# Patient Record
Sex: Female | Born: 1960 | Race: White | Hispanic: No | Smoking: Never smoker
Health system: Southern US, Community
[De-identification: ages and names within clinical notes are randomized; demographics above are authoritative.]

## PROBLEM LIST (undated history)

## (undated) DIAGNOSIS — F419 Anxiety disorder, unspecified: Secondary | ICD-10-CM

## (undated) DIAGNOSIS — F329 Major depressive disorder, single episode, unspecified: Secondary | ICD-10-CM

## (undated) DIAGNOSIS — I8390 Asymptomatic varicose veins of unspecified lower extremity: Secondary | ICD-10-CM

## (undated) DIAGNOSIS — H3530 Unspecified macular degeneration: Secondary | ICD-10-CM

## (undated) DIAGNOSIS — M419 Scoliosis, unspecified: Secondary | ICD-10-CM

## (undated) DIAGNOSIS — Z9889 Other specified postprocedural states: Secondary | ICD-10-CM

## (undated) DIAGNOSIS — T7840XA Allergy, unspecified, initial encounter: Secondary | ICD-10-CM

## (undated) DIAGNOSIS — M1990 Unspecified osteoarthritis, unspecified site: Secondary | ICD-10-CM

## (undated) DIAGNOSIS — F32A Depression, unspecified: Secondary | ICD-10-CM

## (undated) DIAGNOSIS — N950 Postmenopausal bleeding: Secondary | ICD-10-CM

## (undated) DIAGNOSIS — N840 Polyp of corpus uteri: Secondary | ICD-10-CM

## (undated) DIAGNOSIS — C801 Malignant (primary) neoplasm, unspecified: Secondary | ICD-10-CM

## (undated) DIAGNOSIS — E039 Hypothyroidism, unspecified: Secondary | ICD-10-CM

## (undated) DIAGNOSIS — IMO0002 Reserved for concepts with insufficient information to code with codable children: Secondary | ICD-10-CM

## (undated) DIAGNOSIS — Z859 Personal history of malignant neoplasm, unspecified: Secondary | ICD-10-CM

## (undated) DIAGNOSIS — J329 Chronic sinusitis, unspecified: Secondary | ICD-10-CM

## (undated) DIAGNOSIS — I872 Venous insufficiency (chronic) (peripheral): Secondary | ICD-10-CM

## (undated) DIAGNOSIS — Z85828 Personal history of other malignant neoplasm of skin: Secondary | ICD-10-CM

## (undated) DIAGNOSIS — R112 Nausea with vomiting, unspecified: Secondary | ICD-10-CM

## (undated) DIAGNOSIS — F3181 Bipolar II disorder: Secondary | ICD-10-CM

## (undated) DIAGNOSIS — R87610 Atypical squamous cells of undetermined significance on cytologic smear of cervix (ASC-US): Secondary | ICD-10-CM

## (undated) HISTORY — PX: COLONOSCOPY: SHX174

## (undated) HISTORY — DX: Unspecified macular degeneration: H35.30

## (undated) HISTORY — DX: Reserved for concepts with insufficient information to code with codable children: IMO0002

## (undated) HISTORY — DX: Allergy, unspecified, initial encounter: T78.40XA

## (undated) HISTORY — DX: Hypothyroidism, unspecified: E03.9

## (undated) HISTORY — DX: Chronic sinusitis, unspecified: J32.9

## (undated) HISTORY — DX: Malignant (primary) neoplasm, unspecified: C80.1

## (undated) HISTORY — DX: Scoliosis, unspecified: M41.9

## (undated) HISTORY — DX: Atypical squamous cells of undetermined significance on cytologic smear of cervix (ASC-US): R87.610

---

## 1993-05-31 HISTORY — PX: INGUINAL HERNIA REPAIR: SUR1180

## 1999-06-01 HISTORY — PX: ACHILLES TENDON SURGERY: SHX542

## 2001-10-17 ENCOUNTER — Other Ambulatory Visit: Admission: RE | Admit: 2001-10-17 | Discharge: 2001-10-17 | Payer: Self-pay | Admitting: Gynecology

## 2002-12-05 ENCOUNTER — Other Ambulatory Visit: Admission: RE | Admit: 2002-12-05 | Discharge: 2002-12-05 | Payer: Self-pay | Admitting: Gynecology

## 2004-01-13 ENCOUNTER — Other Ambulatory Visit: Admission: RE | Admit: 2004-01-13 | Discharge: 2004-01-13 | Payer: Self-pay | Admitting: Gynecology

## 2005-01-20 ENCOUNTER — Other Ambulatory Visit: Admission: RE | Admit: 2005-01-20 | Discharge: 2005-01-20 | Payer: Self-pay | Admitting: Gynecology

## 2005-12-13 ENCOUNTER — Other Ambulatory Visit: Admission: RE | Admit: 2005-12-13 | Discharge: 2005-12-13 | Payer: Self-pay | Admitting: Gynecology

## 2006-04-28 ENCOUNTER — Ambulatory Visit: Payer: Self-pay | Admitting: Internal Medicine

## 2006-08-09 ENCOUNTER — Ambulatory Visit: Payer: Self-pay | Admitting: Internal Medicine

## 2006-12-29 ENCOUNTER — Other Ambulatory Visit: Admission: RE | Admit: 2006-12-29 | Discharge: 2006-12-29 | Payer: Self-pay | Admitting: Gynecology

## 2007-03-08 DIAGNOSIS — R519 Headache, unspecified: Secondary | ICD-10-CM | POA: Insufficient documentation

## 2007-03-08 DIAGNOSIS — R51 Headache: Secondary | ICD-10-CM | POA: Insufficient documentation

## 2007-03-31 ENCOUNTER — Ambulatory Visit: Payer: Self-pay | Admitting: Internal Medicine

## 2007-03-31 DIAGNOSIS — K006 Disturbances in tooth eruption: Secondary | ICD-10-CM | POA: Insufficient documentation

## 2007-06-27 ENCOUNTER — Ambulatory Visit: Payer: Self-pay | Admitting: Internal Medicine

## 2007-06-27 DIAGNOSIS — J069 Acute upper respiratory infection, unspecified: Secondary | ICD-10-CM | POA: Insufficient documentation

## 2008-01-02 ENCOUNTER — Other Ambulatory Visit: Admission: RE | Admit: 2008-01-02 | Discharge: 2008-01-02 | Payer: Self-pay | Admitting: Gynecology

## 2009-02-05 ENCOUNTER — Encounter: Payer: Self-pay | Admitting: Gynecology

## 2009-02-05 ENCOUNTER — Other Ambulatory Visit: Admission: RE | Admit: 2009-02-05 | Discharge: 2009-02-05 | Payer: Self-pay | Admitting: Gynecology

## 2009-02-05 ENCOUNTER — Ambulatory Visit: Payer: Self-pay | Admitting: Gynecology

## 2009-07-25 ENCOUNTER — Emergency Department (HOSPITAL_COMMUNITY): Admission: EM | Admit: 2009-07-25 | Discharge: 2009-07-25 | Payer: Self-pay | Admitting: Emergency Medicine

## 2009-07-29 HISTORY — PX: BREAST SURGERY: SHX581

## 2009-08-01 ENCOUNTER — Telehealth: Payer: Self-pay | Admitting: Internal Medicine

## 2009-09-09 ENCOUNTER — Telehealth: Payer: Self-pay | Admitting: Internal Medicine

## 2009-09-10 ENCOUNTER — Telehealth: Payer: Self-pay | Admitting: Internal Medicine

## 2009-10-10 ENCOUNTER — Telehealth: Payer: Self-pay | Admitting: Internal Medicine

## 2009-10-21 ENCOUNTER — Telehealth: Payer: Self-pay | Admitting: Internal Medicine

## 2009-12-25 ENCOUNTER — Telehealth: Payer: Self-pay | Admitting: Internal Medicine

## 2009-12-26 ENCOUNTER — Encounter (INDEPENDENT_AMBULATORY_CARE_PROVIDER_SITE_OTHER): Payer: Self-pay | Admitting: *Deleted

## 2010-02-04 ENCOUNTER — Encounter (INDEPENDENT_AMBULATORY_CARE_PROVIDER_SITE_OTHER): Payer: Self-pay | Admitting: *Deleted

## 2010-02-05 ENCOUNTER — Ambulatory Visit: Payer: Self-pay | Admitting: Internal Medicine

## 2010-02-05 ENCOUNTER — Ambulatory Visit: Payer: Self-pay | Admitting: Gynecology

## 2010-02-05 ENCOUNTER — Other Ambulatory Visit: Admission: RE | Admit: 2010-02-05 | Discharge: 2010-02-05 | Payer: Self-pay | Admitting: Gynecology

## 2010-02-19 ENCOUNTER — Ambulatory Visit: Payer: Self-pay | Admitting: Internal Medicine

## 2010-03-13 ENCOUNTER — Ambulatory Visit: Payer: Self-pay | Admitting: Gynecology

## 2010-05-31 HISTORY — PX: FACIAL COSMETIC SURGERY: SHX629

## 2010-06-30 NOTE — Procedures (Signed)
Summary: Colonoscopy  Patient: Dorothy Lynch Note: All result statuses are Final unless otherwise noted.  Tests: (1) Colonoscopy (COL)   COL Colonoscopy           DONE     [facility]     [address]     [place], [place]  [address]           COLONOSCOPY PROCEDURE REPORT           PATIENT:  Dorothy Lynch, Dorothy Lynch  MR#:  [id number]     BIRTHDATE:  1961-04-09, 49 yrs. old  GENDER:  female     ENDOSCOPIST:  Hedwig Morton. Juanda Chance, MD     REF. BY:  Birdie Sons, M.D.     PROCEDURE DATE:  02/19/2010     PROCEDURE:  Colonoscopy 19147     ASA CLASS:  Class I     INDICATIONS:  Routine Risk Screening     MEDICATIONS:   Versed 10 mg, Fentanyl 100 mcg           DESCRIPTION OF PROCEDURE:   After the risks benefits and     alternatives of the procedure were thoroughly explained, informed     consent was obtained.  Digital rectal exam was performed and     revealed no rectal masses.   The LB CF-[id number] endoscope     was introduced through the anus and advanced to the cecum, which     was identified by both the appendix and ileocecal valve, without     limitations.  The quality of the prep was good, using MiraLax.     The instrument was then slowly withdrawn as the colon was fully     examined.     <<PROCEDUREIMAGES>>           FINDINGS:  No polyps or cancers were seen (see image1, image2,     image3, and image4).   Retroflexed views in the rectum revealed no     abnormalities.    The scope was then withdrawn from the patient     and the procedure completed.           COMPLICATIONS:  None     ENDOSCOPIC IMPRESSION:     1) No polyps or cancers     2) Normal colonoscopy     RECOMMENDATIONS:     1) high fiber diet     REPEAT EXAM:  In 10 year(s) for.           ______________________________     Hedwig Morton. Juanda Chance, MD           CC:           n.     eSIGNED:   Hedwig Morton. Brodie at 02/19/2010 10:04 AM           Lollie Sails, [id number]  Note: An exclamation mark (!) indicates a result that was  not dispersed into the flowsheet. Document Creation Date: 02/19/2010 10:04 AM _______________________________________________________________________  (1) Order result status: Final Collection or observation date-time: 02/19/2010 09:59 Requested date-time:  Receipt date-time:  Reported date-time:  Referring Physician:   Ordering Physician: Lina Sar [phone]) Specimen Source:  Source: Launa Grill Order Number: [id number] Lab site:   Appended Document: Colonoscopy    Clinical Lists Changes  Observations: Added new observation of COLONNXTDUE: 01/2020 (02/19/2010 14:11)

## 2010-06-30 NOTE — Progress Notes (Signed)
Summary: REQ FOR RETURN CALL?  Phone Note Call from Patient   Caller: Patient Reason for Call: Talk to Nurse, Talk to Doctor Summary of Call: Pt called to speak with Alvino Chapel, RN or Dr Cato Mulligan .... Pt adv she had questions or concerns about her medication and would like to speak with someone..... Pt adv Dr Cato Mulligan is familiar with her situation...?  Pt can be reached at # [phone].  Initial call taken by: Debbra Riding,  Oct 21, 2009 8:29 AM    New/Updated Medications: LEXAPRO 20 MG TABS (ESCITALOPRAM OXALATE) take 1/2 tablet at 7PM daily VENLAFAXINE HCL 37.5 MG XR24H-CAP (VENLAFAXINE HCL) Take 1 tablet by mouth once a day for 7 days and then 2 by mouth once daily Prescriptions: VENLAFAXINE HCL 37.5 MG XR24H-CAP (VENLAFAXINE HCL) Take 1 tablet by mouth once a day for 7 days and then 2 by mouth once daily  #60 x 11   Entered and Authorized by:   Birdie Sons MD   Signed by:   Birdie Sons MD on 10/21/2009   Method used:   Electronically to        [facility]. [phone]* (retail)       [phone]       [place], [place]  [address]       Ph: [phone]       Fax: [phone]   RxID:   [id number]

## 2010-06-30 NOTE — Letter (Signed)
Summary: Previsit letter  [facility]  [address] [place], [place] [address]   Phone: [phone]  Fax: [phone]       12/26/2009 MRN: [id number]  [facility] [address] [place], [place]  [address]  Dear Ms. Henkin,  Welcome to the Gastroenterology Division at [facility].    You are scheduled to see a nurse for your pre-procedure visit on 02-05-10 at 9:00a.m. on the 3rd floor at [facility], [address]  We ask that you try to arrive at our office 15 minutes prior to your appointment time to allow for check-in.  Your nurse visit will consist of discussing your medical and surgical history, your immediate family medical history, and your medications.    Please bring a complete list of all your medications or, if you prefer, bring the medication bottles and we will list them.  We will need to be aware of both prescribed and over the counter drugs.  We will need to know exact dosage information as well.  If you are on blood thinners (Coumadin, Plavix, Aggrenox, Ticlid, etc.) please call our office today/prior to your appointment, as we need to consult with your physician about holding your medication.   Please be prepared to read and sign documents such as consent forms, a financial agreement, and acknowledgement forms.  If necessary, and with your consent, a friend or relative is welcome to sit-in on the nurse visit with you.  Please bring your insurance card so that we may make a copy of it.  If your insurance requires a referral to see a specialist, please bring your referral form from your primary care physician.  No co-pay is required for this nurse visit.     If you cannot keep your appointment, please call [phone] to cancel or reschedule prior to your appointment date.  This allows [place] the opportunity to schedule an appointment for another patient in need of care.    Thank you for choosing [facility] for your medical  needs.  We appreciate the opportunity to care for you.  Please visit [place] at our website  to learn more about our practice.                     Sincerely.                                                                                                                   The Gastroenterology Division

## 2010-06-30 NOTE — Progress Notes (Signed)
Summary: lamotrigine correction  Phone Note From Pharmacy   [facility] Call For: Dorothy Lynch  Summary of Call: Insurance requires 90 day Rx for lamotrigine Initial call taken by: Gladis Riffle, RN,  September 10, 2009 9:28 AM  Follow-up for Phone Call        see Rx.  Corrected Rx sent with note to disregard #60/3 Follow-up by: Gladis Riffle, RN,  September 10, 2009 9:30 AM    Prescriptions: LAMOTRIGINE 100 MG TABS (LAMOTRIGINE) Take 1 tablet by mouth two times a day  #180 x 5   Entered by:   Gladis Riffle, RN   Authorized by:   Birdie Sons MD   Signed by:   Gladis Riffle, RN on 09/10/2009   Method used:   [facility]  [address]  [phone]* (retail)       [address] [place], [place]  [address]       Ph: [phone] or [phone]       Fax: [phone]   RxID:   [id number]

## 2010-06-30 NOTE — Progress Notes (Signed)
Summary: FYI  Phone Note Call from Patient   Caller: Patient live Call For: Birdie Sons MD Summary of Call: on lamictyl 100mg  two at bedtime and lexapro 20mg  at hs.  wants to discuss so appt made for 5/20.  Med list changed to reflect. Initial call taken by: Gladis Riffle, RN,  Oct 10, 2009 11:49 AM    New/Updated Medications: LAMOTRIGINE 100 MG TABS (LAMOTRIGINE) Take 2 tablets by mouth  at 7PM daily LEXAPRO 20 MG TABS (ESCITALOPRAM OXALATE) take one tablet at 7PM daily

## 2010-06-30 NOTE — Progress Notes (Signed)
Summary: new pharmacy--sig change?  Phone Note Call from Patient Call back at Home Phone [phone]   Caller: Patient Call For: Birdie Sons MD Complaint: Urinary/GYN Problems Summary of Call: pt switch pharmacy she needs new rx [facility] lamotrigine 100mg  twice a day. Initial call taken by: Heron Sabins,  September 09, 2009 10:50 AM  Follow-up for Phone Call        Med list says 100mg  once daily.  Do you want increase to 100mg  two times a day? Follow-up by: Gladis Riffle, RN,  September 09, 2009 3:12 PM  Additional Follow-up for Phone Call Additional follow up Details #1::        ok two times a day  Additional Follow-up by: Birdie Sons MD,  September 09, 2009 4:56 PM    Additional Follow-up for Phone Call Additional follow up Details #2::    see Rx.Patient husband notified.  Follow-up by: Gladis Riffle, RN,  September 10, 2009 8:24 AM  New/Updated Medications: LAMOTRIGINE 100 MG TABS (LAMOTRIGINE) Take 1 tablet by mouth two times a day Prescriptions: LAMOTRIGINE 100 MG TABS (LAMOTRIGINE) Take 1 tablet by mouth two times a day  #60 x 5   Entered by:   Gladis Riffle, RN   Authorized by:   Birdie Sons MD   Signed by:   Gladis Riffle, RN on 09/10/2009   Method used:   [facility]        [facility]  [address]  [phone]* (retail)       [address] [place], [place]  [address]       Ph: [phone] or [phone]       Fax: [phone]   RxID:   [id number]

## 2010-06-30 NOTE — Letter (Signed)
Summary: [facility] Instructions  [facility]  [address] [place], [place] [address]   Phone: [phone]  Fax: [phone]       Dorothy Lynch    06/16/1960    MRN: [id number]       Procedure Day Dorna Bloom:  Lenor Coffin  02/19/10     Arrival Time: 8:30AM     Procedure Time:  9:30AM     Location of Procedure:                    _ X_  [facility] (4th Floor)   PREPARATION FOR COLONOSCOPY WITH MIRALAX  Starting 5 days prior to your procedure 02/14/10 do not eat nuts, seeds, popcorn, corn, beans, peas,  salads, or any raw vegetables.  Do not take any fiber supplements (e.g. Metamucil, Citrucel, and Benefiber). ____________________________________________________________________________________________________   THE DAY BEFORE YOUR PROCEDURE         DATE: 02/18/10  DAY: WEDNESDAY  1   Drink clear liquids the entire day-NO SOLID FOOD  2   Do not drink anything colored red or purple.  Avoid juices with pulp.  No orange juice.  3   Drink at least 64 oz. (8 glasses) of fluid/clear liquids during the day to prevent dehydration and help the prep work efficiently.  CLEAR LIQUIDS INCLUDE: Water Jello Ice Popsicles Tea (sugar ok, no milk/cream) Powdered fruit flavored drinks Coffee (sugar ok, no milk/cream) Gatorade Juice: apple, white grape, white cranberry  Lemonade Clear bullion, consomm, broth Carbonated beverages (any kind) Strained chicken noodle soup Hard Candy  4   Mix the entire bottle of Miralax with 64 oz. of Gatorade/Powerade in the morning and put in the refrigerator to chill.  5   At 3:00 pm take 2 Dulcolax/Bisacodyl tablets.  6   At 4:30 pm take one Reglan/Metoclopramide tablet.  7  Starting at 5:00 pm drink one 8 oz glass of the Miralax mixture every 15-20 minutes until you have finished drinking the entire 64 oz.  You should finish drinking prep around 7:30 or 8:00 pm.  8   If you are nauseated, you may take the 2nd Reglan/Metoclopramide  tablet at 6:30 pm.        9    At 8:00 pm take 2 more DULCOLAX/Bisacodyl tablets.     THE DAY OF YOUR PROCEDURE      DATE:  02/19/10  DAY: Lenor Coffin  You may drink clear liquids until 7:30AM  (2 HOURS BEFORE PROCEDURE).   MEDICATION INSTRUCTIONS  Unless otherwise instructed, you should take regular prescription medications with a small sip of water as early as possible the morning of your procedure.         OTHER INSTRUCTIONS  You will need a responsible adult at least 51 years of age to accompany you and drive you home.   This person must remain in the waiting room during your procedure.  Wear loose fitting clothing that is easily removed.  Leave jewelry and other valuables at home.  However, you may wish to bring a book to read or an iPod/MP3 player to listen to music as you wait for your procedure to start.  Remove all body piercing jewelry and leave at home.  Total time from sign-in until discharge is approximately 2-3 hours.  You should go home directly after your procedure and rest.  You can resume normal activities the day after your procedure.  The day of your procedure you should not:   Drive  Make legal decisions   Operate machinery   Drink alcohol   Return to work  You will receive specific instructions about eating, activities and medications before you leave.   The above instructions have been reviewed and explained to me by   Karl Bales RN  February 05, 2010 9:39 AM    I fully understand and can verbalize these instructions _____________________________ Date _______

## 2010-06-30 NOTE — Progress Notes (Signed)
Summary: colonoscopy  Phone Note Call from Patient Call back at Work Phone [phone]   Caller: Patient Call For: Birdie Sons MD Reason for Call: Talk to Doctor Details for Reason: colonoscopy Summary of Call: patient is requesting a referral for a colonoscopy with dr Melina Fiddler if possible Initial call taken by: Kern Reap CMA Duncan Dull),  December 25, 2009 2:22 PM  Follow-up for Phone Call        ok Follow-up by: Birdie Sons MD,  December 25, 2009 4:05 PM  New Problems: SCREENING, COLON CANCER (ICD-V76.51)   New Problems: SCREENING, COLON CANCER (ICD-V76.51)

## 2010-06-30 NOTE — Miscellaneous (Signed)
Summary: [facility] previsit  Clinical Lists Changes  Medications: Added new medication of DULCOLAX 5 MG  TBEC (BISACODYL) Day before procedure take 2 at 3pm and 2 at 8pm. - Signed Added new medication of METOCLOPRAMIDE HCL 10 MG  TABS (METOCLOPRAMIDE HCL) As per prep instructions. - Signed Added new medication of MIRALAX   POWD (POLYETHYLENE GLYCOL 3350) As per prep  instructions. - Signed Rx of DULCOLAX 5 MG  TBEC (BISACODYL) Day before procedure take 2 at 3pm and 2 at 8pm.;  #4 x 0;  Signed;  Entered by: Karl Bales RN;  Authorized by: Hart Carwin MD;  Method used: [facility]  [phone]*, [address], [place], [place]  [address], Ph: [phone] or [phone], Fax: [phone] Rx of METOCLOPRAMIDE HCL 10 MG  TABS (METOCLOPRAMIDE HCL) As per prep instructions.;  #2 x 0;  Signed;  Entered by: Karl Bales RN;  Authorized by: Hart Carwin MD;  Method used: Electronically to [facility]  [phone]*, [address], [place], [place]  [address], Ph: [phone] or [phone], Fax: [phone] Rx of MIRALAX   POWD (POLYETHYLENE GLYCOL 3350) As per prep  instructions.;  #255gm x 0;  Signed;  Entered by: Karl Bales RN;  Authorized by: Hart Carwin MD;  Method used: Electronically to [facility]  [phone]*, [address] [place], [place]  [address], Ph: [phone] or [phone], Fax: [phone]    Prescriptions: MIRALAX   POWD (POLYETHYLENE GLYCOL 3350) As per prep  instructions.  #255gm x 0   Entered by:   Karl Bales RN   Authorized by:   Hart Carwin MD   Signed by:   Karl Bales RN on 02/05/2010   Method used:   Electronically to        [facility]  [phone]* (retail)       [address] [place], [place]  [address]       Ph: [phone] or [phone]       Fax: [phone]   RxID:   [id number] METOCLOPRAMIDE HCL 10 MG  TABS (METOCLOPRAMIDE HCL) As per prep instructions.  #2 x 0   Entered by:   Karl Bales RN   Authorized by:   Hart Carwin MD   Signed by:   Karl Bales RN on 02/05/2010   Method used:   [facility]ronically to        [facility]  [phone]* (retail)       [address] [place], [place]  [address]       Ph: [phone] or [phone]       Fax: [phone]   RxID:   [id number] DULCOLAX 5 MG  TBEC (BISACODYL) Day before procedure take 2 at 3pm and 2 at 8pm.  #4 x 0   Entered by:   Karl Bales RN   Authorized by:   Hart Carwin MD   Signed by:   Karl Bales RN on 02/05/2010   Method used:   [facility]ronically to        [facility]  [phone]* (retail)       [address] [place], [place]  [address]       Ph: [phone] or [phone]       Fax: [phone]   RxID:   [id number]

## 2010-06-30 NOTE — Progress Notes (Signed)
Summary: lamictal  Phone Note Call from Patient Call back at Home Phone [phone]   Summary of Call: Per  Psychologist Mckinght need Rx callin per Dr. Cato Mulligan.  She knows they have been talking.  Lamictal.  [facility] [phone].   Allergic to Pcn and sulfa. Initial call taken by: Rudy Jew, RN,  August 01, 2009 2:45 PM  Follow-up for Phone Call        see rx Follow-up by: Birdie Sons MD,  August 01, 2009 3:35 PM  Additional Follow-up for Phone Call Additional follow up Details #1::        Phone Call Completed Additional Follow-up by: Rudy Jew, RN,  August 01, 2009 3:47 PM    New/Updated Medications: LAMICTAL 100 MG  TABS (LAMOTRIGINE) Take 1 tablet by mouth once a day Prescriptions: LAMICTAL 100 MG  TABS (LAMOTRIGINE) Take 1 tablet by mouth once a day  #30 x 6   Entered and Authorized by:   Birdie Sons MD   Signed by:   Birdie Sons MD on 08/01/2009   Method used:   [facility]ronically to        [facility]  [phone]* (retail)       [address] [place], [place]  [address]       Ph: [phone] or [phone]       Fax: [phone]   RxID:   [id number]

## 2010-07-10 ENCOUNTER — Other Ambulatory Visit: Payer: Self-pay | Admitting: *Deleted

## 2010-07-10 DIAGNOSIS — F419 Anxiety disorder, unspecified: Secondary | ICD-10-CM

## 2010-07-10 MED ORDER — VENLAFAXINE HCL ER 37.5 MG PO CP24: 37.5000 mg | ORAL_CAPSULE | Freq: Every day | ORAL | Status: DC

## 2010-08-21 LAB — DIFFERENTIAL
Basophils Relative: 1 % (ref 0–1)
Eosinophils Absolute: 0.3 10*3/uL (ref 0.0–0.7)
Eosinophils Relative: 4 % (ref 0–5)
Lymphs Abs: 2.3 10*3/uL (ref 0.7–4.0)
Monocytes Absolute: 0.7 10*3/uL (ref 0.1–1.0)
Monocytes Relative: 9 % (ref 3–12)

## 2010-08-21 LAB — URINALYSIS, ROUTINE W REFLEX MICROSCOPIC
Bilirubin Urine: NEGATIVE
Glucose, UA: NEGATIVE mg/dL
Hgb urine dipstick: NEGATIVE
Specific Gravity, Urine: 1.006 (ref 1.005–1.030)

## 2010-08-21 LAB — CBC
HCT: 38.9 % (ref 36.0–46.0)
MCHC: 34.6 g/dL (ref 30.0–36.0)
MCV: 96.2 fL (ref 78.0–100.0)
RBC: 4.04 MIL/uL (ref 3.87–5.11)
WBC: 7.6 10*3/uL (ref 4.0–10.5)

## 2010-09-19 ENCOUNTER — Other Ambulatory Visit: Payer: Self-pay | Admitting: Internal Medicine

## 2011-01-13 ENCOUNTER — Telehealth: Payer: Self-pay | Admitting: *Deleted

## 2011-01-13 NOTE — Telephone Encounter (Signed)
Pt. Wants to ask Dr. Cato Mulligan who he would recommend for her to see re: increasing forgetfulness.  She is very concerned that she cannot remember "anything".  Is wondering about the Ambien and does take one every night, but she forgets all day long.  Not just after taking Ambien, and she only takes it right before she is ready to sleep.

## 2011-01-14 ENCOUNTER — Ambulatory Visit: Payer: Self-pay | Admitting: Family Medicine

## 2011-01-14 NOTE — Telephone Encounter (Signed)
Pt called back and scheduled appt with Dr. Cato Mulligan.

## 2011-01-15 ENCOUNTER — Encounter: Payer: Self-pay | Admitting: Internal Medicine

## 2011-01-18 ENCOUNTER — Encounter: Payer: Self-pay | Admitting: Internal Medicine

## 2011-01-18 ENCOUNTER — Ambulatory Visit (INDEPENDENT_AMBULATORY_CARE_PROVIDER_SITE_OTHER): Payer: Managed Care, Other (non HMO) | Admitting: Internal Medicine

## 2011-01-18 DIAGNOSIS — F39 Unspecified mood [affective] disorder: Secondary | ICD-10-CM

## 2011-01-18 DIAGNOSIS — G4700 Insomnia, unspecified: Secondary | ICD-10-CM

## 2011-01-18 DIAGNOSIS — F3181 Bipolar II disorder: Secondary | ICD-10-CM | POA: Insufficient documentation

## 2011-01-18 MED ORDER — NORTRIPTYLINE HCL 10 MG PO CAPS: 10.0000 mg | ORAL_CAPSULE | Freq: Every day | ORAL | Status: DC

## 2011-01-18 NOTE — Assessment & Plan Note (Signed)
Trial nortriptyline

## 2011-01-18 NOTE — Progress Notes (Signed)
  Subjective:    Patient ID: Dorothy Lynch, female    DOB: August 24, 1960, 50 y.o.   MRN: [id number]  HPI  Mood disorder  Now she has trouble with memory (work related). She is trying to stop [place] as a result of her forgetfulness. She has had trouble sleeping without the [place].She had been taking 5mg  of ambien nightly.   Insomnia---chronic.   Past Medical History  Diagnosis Date  . Allergy   . Migraines   . Headache    No past surgical history on file.  reports that she has never smoked. She does not have any smokeless tobacco history on file. She reports that she drinks alcohol. She reports that she does not use illicit drugs. family history includes Alcohol abuse in an unspecified family member; Breast cancer in an unspecified family member; and Diabetes in an unspecified family member. Allergies  Allergen Reactions  . Penicillins   . Sulfonamide Derivatives      Review of Systems    patient denies chest pain, shortness of breath, orthopnea. Denies lower extremity edema, abdominal pain, change in appetite, change in bowel movements. Patient denies rashes, musculoskeletal complaints. No other specific complaints in a complete review of systems.    Objective:   Physical Exam  Pleasant female in no acute distress. Neck is supple. Chest clear to auscultation cardiac exam S1 and S2 are regular. Extremities there is no edema.      Assessment & Plan:

## 2011-01-21 NOTE — Assessment & Plan Note (Signed)
She is doing quite well. I don't think her subjective memory loss is related to medications or her mood disorder. Continue current medications.

## 2011-02-16 ENCOUNTER — Encounter: Payer: Self-pay | Admitting: Gynecology

## 2011-02-19 DIAGNOSIS — Z85828 Personal history of other malignant neoplasm of skin: Secondary | ICD-10-CM | POA: Insufficient documentation

## 2011-02-22 ENCOUNTER — Encounter: Payer: Self-pay | Admitting: Gynecology

## 2011-02-22 ENCOUNTER — Ambulatory Visit (INDEPENDENT_AMBULATORY_CARE_PROVIDER_SITE_OTHER): Payer: Managed Care, Other (non HMO) | Admitting: Gynecology

## 2011-02-22 ENCOUNTER — Other Ambulatory Visit (HOSPITAL_COMMUNITY)
Admission: RE | Admit: 2011-02-22 | Discharge: 2011-02-22 | Disposition: A | Discharge status: Home / Self Care | Payer: Managed Care, Other (non HMO) | Source: Ambulatory Visit | Attending: Gynecology | Admitting: Gynecology

## 2011-02-22 VITALS — BP 106/60 | Ht 69.0 in | Wt 155.0 lb

## 2011-02-22 DIAGNOSIS — B3731 Acute candidiasis of vulva and vagina: Secondary | ICD-10-CM

## 2011-02-22 DIAGNOSIS — B373 Candidiasis of vulva and vagina: Secondary | ICD-10-CM

## 2011-02-22 DIAGNOSIS — Z889 Allergy status to unspecified drugs, medicaments and biological substances status: Secondary | ICD-10-CM

## 2011-02-22 DIAGNOSIS — Z13220 Encounter for screening for lipoid disorders: Secondary | ICD-10-CM

## 2011-02-22 DIAGNOSIS — N951 Menopausal and female climacteric states: Secondary | ICD-10-CM

## 2011-02-22 DIAGNOSIS — R413 Other amnesia: Secondary | ICD-10-CM

## 2011-02-22 DIAGNOSIS — G4700 Insomnia, unspecified: Secondary | ICD-10-CM

## 2011-02-22 DIAGNOSIS — Z01419 Encounter for gynecological examination (general) (routine) without abnormal findings: Secondary | ICD-10-CM | POA: Insufficient documentation

## 2011-02-22 DIAGNOSIS — Z9109 Other allergy status, other than to drugs and biological substances: Secondary | ICD-10-CM

## 2011-02-22 LAB — COMPREHENSIVE METABOLIC PANEL
BUN: 15 mg/dL (ref 6–23)
CO2: 26 mEq/L (ref 19–32)
Calcium: 9.3 mg/dL (ref 8.4–10.5)
Chloride: 106 mEq/L (ref 96–112)
Creat: 0.92 mg/dL (ref 0.50–1.10)
Glucose, Bld: 102 mg/dL — ABNORMAL HIGH (ref 70–99)

## 2011-02-22 MED ORDER — FEXOFENADINE HCL 180 MG PO TABS: 180.0000 mg | ORAL_TABLET | Freq: Every day | ORAL | Status: DC

## 2011-02-22 MED ORDER — FLUCONAZOLE 150 MG PO TABS: 150.0000 mg | ORAL_TABLET | Freq: Once | ORAL | Status: AC

## 2011-02-22 MED ORDER — NORETHIN ACE-ETH ESTRAD-FE 1.5-30 MG-MCG PO TABS: 1.0000 | ORAL_TABLET | Freq: Every day | ORAL | Status: DC

## 2011-02-22 NOTE — Progress Notes (Signed)
Dorothy Lynch 1960/09/28 [id number]        50 y.o.  for annual exam.  Overall doing well. Does note some issues with insomnia, forgetfulness and flushes sweats the placebo week of her oral contraceptives. She was seen by a psychiatrist last year due to depression currently is on Lexapro Effexor  and Lamictal.  She states that Dr. Cato Mulligan is her primary and monitors her for this now. She takes Ambien for sleep but says not as effective as was in the past and she is getting up 2-3 times per night. On questioning though it appears she's getting up to void and not due to the inability to sleep.  Lastly she notes a white itchy discharge that comes and goes with a little bit of odor  Past medical history,surgical history, medications, allergies, family history and social history were all reviewed and documented in the EPIC chart. ROS:  Was performed and pertinent positives and negatives are included in the history.  Exam: chaperone present Filed Vitals:   02/22/11 1200  BP: 106/60   General appearance  Normal Skin grossly normal Head/Neck normal with no cervical or supraclavicular adenopathy thyroid normal Lungs  clear Cardiac RR, without RMG Abdominal  soft, nontender, without masses, organomegaly or hernia Breasts  examined lying and sitting without masses, retractions, discharge or axillary adenopathy. Pelvic  Ext/BUS/vagina  normal with white discharge KOH wet prep done  Cervix  normal  Pap done  Uterus  anteverted, normal size, shape and contour, midline and mobile nontender   Adnexa  Without masses or tenderness    Anus and perineum  normal   Rectovaginal  normal sphincter tone without palpated masses or tenderness.    Assessment/Plan:  50 y.o. female for annual exam.    1. White discharge. KOH wet prep is positive for yeast we'll treat with Diflucan 150x1 dose follow up if symptoms persist or recur 2. BCPs. Patient continues to use oral contraceptives, low-dose for contraception and  menstrual regulation. She really feels good on his wants to continue. She does note hot flushes the pill free week. Recheck a TSH FSH. If normal she wants to continue on the pills I did refill her times a year. If elevated then we'll discuss options to include stopping the pills, back up contraception, menstrual calendar. If it is normal she will return in 6 months and recheck the  [facility] during the pill free week. I again reviewed the risks to include stroke heart attack DVT, this may increased with increasing age. She does not smoke she is otherwise healthy and active again she understands accepts risks. 3. Insomnia. On questioning I wonder if it's more not having to urinate then insomnia. Vaster to fluid restrict after dinner to see if this doesn't decrease her awakening for voiding. I did give her samples of Silenor 3 and 6 mgs 8 tabs of each. She'll try the samples if she needs to see how she does with this and then let me know. 4. Depression. Patient is doing well on the regimen she's on she will followup with Dr. Cato Mulligan for continued management. 5. Health maintenance. Self breast exams on the basis discussed encouraged. She had her mammogram in March 2012 and will continue annual mammography is. She is up-to-date with colonoscopies having one recently historically by Dr. Julio Alm and will follow up per their recommendations. Baseline labs to include CBC compress metabolic panel lipid profile urinalysis along with her TSH [facility] were ordered. I refilled her Allegra and her oral contraceptives  in the other medication she is due to see Dr. Cato Mulligan to manage.    Dara Lords MD, 3:22 PM 02/22/2011

## 2011-03-14 ENCOUNTER — Telehealth: Payer: Self-pay | Admitting: Women's Health

## 2011-03-14 DIAGNOSIS — F5102 Adjustment insomnia: Secondary | ICD-10-CM

## 2011-03-16 MED ORDER — ZOLPIDEM TARTRATE 10 MG PO TABS: 10.0000 mg | ORAL_TABLET | Freq: Every evening | ORAL | Status: DC | PRN

## 2011-03-16 NOTE — Telephone Encounter (Signed)
Lm for patient to call

## 2011-03-16 NOTE — Telephone Encounter (Signed)
Patient called about Terazol rx.  She is having yeast infection symptoms.  Says she gets them frequently.  Also, she said she didn't get refill for Ambien 10mg  for her AEX.  She takes 1/2 tab nightly.  Can we rx that as well? (She is your patient came under [place] name, she may have approved one previous time)

## 2011-03-17 NOTE — Telephone Encounter (Signed)
Patient informed all RX's taken care of.

## 2011-05-11 ENCOUNTER — Other Ambulatory Visit: Payer: Self-pay | Admitting: Gynecology

## 2011-08-05 ENCOUNTER — Other Ambulatory Visit: Payer: Self-pay | Admitting: Internal Medicine

## 2011-08-06 ENCOUNTER — Other Ambulatory Visit: Payer: Self-pay

## 2011-08-06 MED ORDER — ZOLPIDEM TARTRATE 10 MG PO TABS: 10.0000 mg | ORAL_TABLET | Freq: Every evening | ORAL | Status: DC | PRN

## 2011-08-06 NOTE — Telephone Encounter (Signed)
Rx phoned into pharmacy.

## 2011-08-25 ENCOUNTER — Encounter: Payer: Self-pay | Admitting: Gynecology

## 2011-08-25 ENCOUNTER — Ambulatory Visit (INDEPENDENT_AMBULATORY_CARE_PROVIDER_SITE_OTHER): Payer: Managed Care, Other (non HMO) | Admitting: Gynecology

## 2011-08-25 DIAGNOSIS — N951 Menopausal and female climacteric states: Secondary | ICD-10-CM

## 2011-08-25 LAB — TSH: TSH: 4.216 u[IU]/mL (ref 0.350–4.500)

## 2011-08-25 MED ORDER — ESTRADIOL 0.1 MG/24HR TD PTTW: 1.0000 | MEDICATED_PATCH | TRANSDERMAL | Status: DC

## 2011-08-25 MED ORDER — PROGESTERONE MICRONIZED 100 MG PO CAPS: 100.0000 mg | ORAL_CAPSULE | Freq: Every day | ORAL | Status: DC

## 2011-08-25 NOTE — Patient Instructions (Signed)
Initiate hormone replacement as discussed. Follow up with me if any issues or concerns.

## 2011-08-25 NOTE — Progress Notes (Signed)
Patient presents complaining of significant menopausal symptoms with hot flashes night sweats. She had been on low-dose oral contraceptives but stopped them last week with acceleration of her symptoms. She had a marginally elevated FSH last year. We'll go ahead and recheck her FSH TSH today. I discussed the issues of HRT and various options to include restarting her low-dose oral contraceptives. Patient wants to try HRT. I reviewed the risks benefits to include the WHI study increased risk of stroke heart attack DVT increased risk of breast cancer the ACOG and NAMS statements her lowest dose for shortest period of time. Advantages of transdermal first pass effect issues reviewed. After a lengthy discussion I gave her samples of many Vivelle 0.1 mg and a prescription for Prometrium 100 mg nightly. 2 when start these now we'll follow up on her labs and we'll go from there. The need for backup contraception over the next 6 months or so until it becomes clear that she is amenorrheic and postmenopausal was reviewed.

## 2011-08-26 ENCOUNTER — Other Ambulatory Visit: Payer: Self-pay | Admitting: *Deleted

## 2011-08-26 DIAGNOSIS — N951 Menopausal and female climacteric states: Secondary | ICD-10-CM

## 2011-08-30 ENCOUNTER — Other Ambulatory Visit: Payer: Self-pay | Admitting: *Deleted

## 2011-08-30 MED ORDER — ZOLPIDEM TARTRATE 10 MG PO TABS: 10.0000 mg | ORAL_TABLET | Freq: Every evening | ORAL | Status: DC | PRN

## 2011-10-11 ENCOUNTER — Other Ambulatory Visit: Payer: Self-pay | Admitting: Internal Medicine

## 2011-10-11 MED ORDER — ESCITALOPRAM OXALATE 20 MG PO TABS: 10.0000 mg | ORAL_TABLET | Freq: Every day | ORAL | Status: DC

## 2011-10-11 NOTE — Telephone Encounter (Signed)
Lexapro is not generic, [facility]

## 2011-10-11 NOTE — Telephone Encounter (Signed)
Pt needs new rx generic lexapro 20 mg [facility] [phone]. Pt is out

## 2011-10-14 ENCOUNTER — Other Ambulatory Visit: Payer: Self-pay | Admitting: *Deleted

## 2011-10-14 ENCOUNTER — Other Ambulatory Visit: Payer: Self-pay | Admitting: Gynecology

## 2011-10-14 DIAGNOSIS — R928 Other abnormal and inconclusive findings on diagnostic imaging of breast: Secondary | ICD-10-CM

## 2011-10-15 ENCOUNTER — Encounter: Payer: Self-pay | Admitting: Gynecology

## 2011-11-10 ENCOUNTER — Telehealth: Payer: Self-pay | Admitting: *Deleted

## 2011-11-10 MED ORDER — ESTRADIOL 1 MG PO TABS: 2.0000 mg | ORAL_TABLET | Freq: Every day | ORAL | Status: DC

## 2011-11-10 NOTE — Telephone Encounter (Signed)
Pt was given vivelle dot patch 0.1 mg, pt said that at night she has terrible hot flashes and has extreme sweating her clothes are wet. She is coming to have her TSH checked on Thursday, but wanted to follow up with you with how patches are working. Please advise

## 2011-11-10 NOTE — Telephone Encounter (Signed)
Recommend trying oral estradiol 1 mg 2 times daily (2 mg total daily) and continue her Prometrium 100 mg nightly and see if this doesn't help with the hot flashes. Stop the Vivelle patch.

## 2011-11-11 NOTE — Telephone Encounter (Signed)
Pt informed with the below note,  

## 2011-11-17 ENCOUNTER — Other Ambulatory Visit: Payer: PRIVATE HEALTH INSURANCE

## 2011-11-17 DIAGNOSIS — N951 Menopausal and female climacteric states: Secondary | ICD-10-CM

## 2011-12-13 ENCOUNTER — Other Ambulatory Visit: Payer: Self-pay | Admitting: Gynecology

## 2011-12-13 ENCOUNTER — Other Ambulatory Visit: Payer: Self-pay | Admitting: Women's Health

## 2011-12-22 ENCOUNTER — Other Ambulatory Visit: Payer: Self-pay | Admitting: Gynecology

## 2011-12-24 ENCOUNTER — Telehealth: Payer: Self-pay | Admitting: *Deleted

## 2011-12-24 MED ORDER — TERCONAZOLE 0.4 % VA CREA: 1.0000 | TOPICAL_CREAM | Freq: Every day | VAGINAL | Status: AC

## 2011-12-24 MED ORDER — TERCONAZOLE 0.8 % VA CREA: 1.0000 | TOPICAL_CREAM | Freq: Every day | VAGINAL | Status: DC

## 2011-12-24 NOTE — Telephone Encounter (Signed)
Pt calling c/o yeast infection, itching, white discharge, irritation for 1 week now. Pt had pharmacy contact office for Terazol 3 day cream & diflucan, Rx was denied. Pt is leaving town tonight for 1 week,she is at work and can't make OV, pt said she will make ov once back from out of town. Please advise

## 2011-12-24 NOTE — Telephone Encounter (Signed)
Spoke with pt and asked if she could have terazol 7 day treatment? Pt said 3 day doesn't work fully at times. Please advise

## 2011-12-24 NOTE — Telephone Encounter (Signed)
Left on voicemail rx sent, call pharmacy to cancel 3 day cream.

## 2011-12-24 NOTE — Telephone Encounter (Signed)
Terazol 3 day cream 1 applicator each bedtime x3 should do it. Not sure adding Diflucan would be of any benefit.

## 2012-01-06 ENCOUNTER — Encounter: Payer: Self-pay | Admitting: Gynecology

## 2012-01-06 ENCOUNTER — Ambulatory Visit (INDEPENDENT_AMBULATORY_CARE_PROVIDER_SITE_OTHER): Payer: PRIVATE HEALTH INSURANCE | Admitting: Gynecology

## 2012-01-06 DIAGNOSIS — R350 Frequency of micturition: Secondary | ICD-10-CM

## 2012-01-06 DIAGNOSIS — N898 Other specified noninflammatory disorders of vagina: Secondary | ICD-10-CM

## 2012-01-06 LAB — WET PREP FOR TRICH, YEAST, CLUE
Clue Cells Wet Prep HPF POC: NONE SEEN
Trich, Wet Prep: NONE SEEN
Yeast Wet Prep HPF POC: NONE SEEN

## 2012-01-06 LAB — URINALYSIS W MICROSCOPIC + REFLEX CULTURE
Bilirubin Urine: NEGATIVE
Glucose, UA: NEGATIVE mg/dL
Specific Gravity, Urine: 1.01 (ref 1.005–1.030)
pH: 7 (ref 5.0–8.0)

## 2012-01-06 MED ORDER — ZOLPIDEM TARTRATE 10 MG PO TABS: 10.0000 mg | ORAL_TABLET | Freq: Every evening | ORAL | Status: DC | PRN

## 2012-01-06 MED ORDER — ESTRADIOL 1 MG PO TABS: 1.0000 mg | ORAL_TABLET | Freq: Every day | ORAL | Status: DC

## 2012-01-06 MED ORDER — FLUCONAZOLE 150 MG PO TABS: 150.0000 mg | ORAL_TABLET | Freq: Once | ORAL | Status: AC

## 2012-01-06 NOTE — Patient Instructions (Signed)
Take to estrogen pills in the morning and one estrogen pill at bedtime. Continue the progesterone pill at bedtime. Call me if your heart flashes/night sweats continue.

## 2012-01-06 NOTE — Progress Notes (Signed)
Patient presents complaining of a slight vaginal irritation. She just finished a Terazol 7 day cream feels much better but has a little residual irritation. Also notes significant night sweats. She is on HRT estradiol 2 mg in the morning and Prometrium 100 mg at bedtime. She notes her hot flashes during the day have resolved but still is having night sweats. I did verify that she is not taking her birth control pills and she verifies that she is not taking them.  Also noting some bladder pressure symptoms. No frequency dysuria urgency low back pain.  Exam with Amy assistant Abdomen soft nontender without masses guarding rebound organomegaly.  Pelvic external BUS vagina normal. Cervix normal. Uterus normal size midline mobile nontender. Adnexa without masses or tenderness.  Assessment and plan: Slight vaginal irritation. Exam is normal. Wet prep is negative. Urinalysis is negative. We'll await and see if this doesn't resolve spontaneously as the Terazol currently takes effect. If it does persist or recur she'll call.  She did ask for refill of Diflucan that she is uses over the year to help intermittently with recurrent yeast and I prescribed Diflucan 150x1 dose with 3 refills.  She is off of Ambien routinely but did ask for a prescription to have just in case she has a bad night.  I prescribed Ambien 10 mg #30 no refill.  We discussed her hot flushes and sounds like she's having a positive response to HRT. I suggested adding 1 mg at bedtime and estradiol such that she is now taking a total of 3 mg daily 2 in the morning and one at bedtime and continue the 100 mg Prometrium and we'll see how she does. Lastly patient has some questions as far as libido/orgasmic issues and we discussed these.

## 2012-01-07 ENCOUNTER — Ambulatory Visit: Payer: PRIVATE HEALTH INSURANCE | Admitting: Gynecology

## 2012-02-09 ENCOUNTER — Other Ambulatory Visit: Payer: Self-pay | Admitting: Internal Medicine

## 2012-02-23 ENCOUNTER — Encounter: Payer: Self-pay | Admitting: Gynecology

## 2012-02-23 ENCOUNTER — Ambulatory Visit (INDEPENDENT_AMBULATORY_CARE_PROVIDER_SITE_OTHER): Payer: PRIVATE HEALTH INSURANCE | Admitting: Gynecology

## 2012-02-23 VITALS — BP 120/72 | Ht 68.75 in | Wt 147.0 lb

## 2012-02-23 DIAGNOSIS — N899 Noninflammatory disorder of vagina, unspecified: Secondary | ICD-10-CM

## 2012-02-23 DIAGNOSIS — Z13220 Encounter for screening for lipoid disorders: Secondary | ICD-10-CM

## 2012-02-23 DIAGNOSIS — N951 Menopausal and female climacteric states: Secondary | ICD-10-CM

## 2012-02-23 DIAGNOSIS — Z01419 Encounter for gynecological examination (general) (routine) without abnormal findings: Secondary | ICD-10-CM

## 2012-02-23 DIAGNOSIS — N898 Other specified noninflammatory disorders of vagina: Secondary | ICD-10-CM

## 2012-02-23 LAB — COMPREHENSIVE METABOLIC PANEL
ALT: 16 U/L (ref 0–35)
AST: 19 U/L (ref 0–37)
Alkaline Phosphatase: 55 U/L (ref 39–117)
Glucose, Bld: 97 mg/dL (ref 70–99)
Sodium: 134 mEq/L — ABNORMAL LOW (ref 135–145)
Total Bilirubin: 0.4 mg/dL (ref 0.3–1.2)
Total Protein: 6.5 g/dL (ref 6.0–8.3)

## 2012-02-23 LAB — CBC WITH DIFFERENTIAL/PLATELET
Basophils Absolute: 0 10*3/uL (ref 0.0–0.1)
Basophils Relative: 0 % (ref 0–1)
Eosinophils Absolute: 0.2 10*3/uL (ref 0.0–0.7)
MCH: 32.1 pg (ref 26.0–34.0)
MCHC: 33.5 g/dL (ref 30.0–36.0)
Neutrophils Relative %: 63 % (ref 43–77)
Platelets: 263 10*3/uL (ref 150–400)
RDW: 13.1 % (ref 11.5–15.5)

## 2012-02-23 LAB — URINALYSIS W MICROSCOPIC + REFLEX CULTURE
Bilirubin Urine: NEGATIVE
Crystals: NONE SEEN
Glucose, UA: NEGATIVE mg/dL
Ketones, ur: NEGATIVE mg/dL
RBC / HPF: NONE SEEN RBC/hpf (ref <3)
Specific Gravity, Urine: 1.015 (ref 1.005–1.030)

## 2012-02-23 LAB — LIPID PANEL
LDL Cholesterol: 72 mg/dL (ref 0–99)
Triglycerides: 142 mg/dL (ref <150)
VLDL: 28 mg/dL (ref 0–40)

## 2012-02-23 LAB — WET PREP FOR TRICH, YEAST, CLUE: Trich, Wet Prep: NONE SEEN

## 2012-02-23 MED ORDER — PROGESTERONE MICRONIZED 100 MG PO CAPS: 100.0000 mg | ORAL_CAPSULE | Freq: Every day | ORAL | Status: DC

## 2012-02-23 MED ORDER — FLUCONAZOLE 200 MG PO TABS: 200.0000 mg | ORAL_TABLET | Freq: Every day | ORAL | Status: DC

## 2012-02-23 MED ORDER — ESTRADIOL 1 MG PO TABS: 1.0000 mg | ORAL_TABLET | Freq: Every day | ORAL | Status: DC

## 2012-02-23 NOTE — Patient Instructions (Addendum)
Take Diflucan daily for 5 days. Call if any issues with Hormones Schedule annual exam in one year  Hormone Therapy At menopause, your body begins making less estrogen and progesterone hormones. This causes the body to stop having menstrual periods. This is because estrogen and progesterone hormones control your periods and menstrual cycle. A lack of estrogen may cause symptoms such as:  Hot flushes (or hot flashes).   Vaginal dryness.   Dry skin.   Loss of sex drive.   Risk of bone loss (osteoporosis).  When this happens, you may choose to take hormone therapy to get back the estrogen lost during menopause. When the hormone estrogen is given alone, it is usually referred to as ET (Estrogen Therapy). When the hormone progestin is combined with estrogen, it is generally called HT (Hormone Therapy). This was formerly known as hormone replacement therapy (HRT). Your caregiver can help you make a decision on what will be best for you. The decision to use HT seems to change often as new studies are done. Many studies do not agree on the benefits of hormone replacement therapy. LIKELY BENEFITS OF HT INCLUDE PROTECTION FROM:  Hot Flushes (also called hot flashes) - A hot flush is a sudden feeling of heat that spreads over the face and body. The skin may redden like a blush. It is connected with sweats and sleep disturbance. Women going through menopause may have hot flushes a few times a month or several times per day depending on the woman.   Osteoporosis (bone loss)- Estrogen helps guard against bone loss. After menopause, a woman's bones slowly lose calcium and become weak and brittle. As a result, bones are more likely to break. The hip, wrist, and spine are affected most often. Hormone therapy can help slow bone loss after menopause. Weight bearing exercise and taking calcium with vitamin D also can help prevent bone loss. There are also medications that your caregiver can prescribe that can help  prevent osteoporosis.   Vaginal Dryness - Loss of estrogen causes changes in the vagina. Its lining may become thin and dry. These changes can cause pain and bleeding during sexual intercourse. Dryness can also lead to infections. This can cause burning and itching. (Vaginal estrogen treatment can help relieve pain, itching, and dryness.)   Urinary Tract Infections are more common after menopause because of lack of estrogen. Some women also develop urinary incontinence because of low estrogen levels in the vagina and bladder.   Possible other benefits of estrogen include a positive effect on mood and short-term memory in women.  RISKS AND COMPLICATIONS  Using estrogen alone without progesterone causes the lining of the uterus to grow. This increases the risk of lining of the uterus (endometrial) cancer. Your caregiver should give another hormone called progestin if you have a uterus.   Women who take combined (estrogen and progestin) HT appear to have an increased risk of breast cancer. The risk appears to be small, but increases throughout the time that HT is taken.   Combined therapy also makes the breast tissue slightly denser which makes it harder to read mammograms (breast X-rays).   Combined, estrogen and progesterone therapy can be taken together every day, in which case there may be spotting of blood. HT therapy can be taken cyclically in which case you will have menstrual periods. Cyclically means HT is taken for a set amount of days, then not taken, then this process is repeated.   HT may increase the risk of stroke,  heart attack, breast cancer and forming blood clots in your leg.   Transdermal estrogen (estrogen that is absorbed through the skin with a patch or a cream) may have more positive results with:   Cholesterol.   Blood pressure.   Blood clots.  Having the following conditions may indicate you should not have HT:  Endometrial cancer.   Liver disease.   Breast  cancer.   Heart disease.   History of blood clots.   Stroke.  TREATMENT   If you choose to take HT and have a uterus, usually estrogen and progestin are prescribed.   Your caregiver will help you decide the best way to take the medications.   Possible ways to take estrogen include:   Pills.   Patches.   Gels.   Sprays.   Vaginal estrogen cream, rings and tablets.   It is best to take the lowest dose possible that will help your symptoms and take them for the shortest period of time that you can.   Hormone therapy can help relieve some of the problems (symptoms) that affect women at menopause. Before making a decision about HT, talk to your caregiver about what is best for you. Be well informed and comfortable with your decisions.  HOME CARE INSTRUCTIONS   Follow your caregivers advice when taking the medications.   A Pap test is done to screen for cervical cancer.   The first Pap test should be done at age 18.   Between ages 58 and 47, Pap tests are repeated every 2 years.   Beginning at age 61, you are advised to have a Pap test every 3 years as long as your past 3 Pap tests have been normal.   Some women have medical problems that increase the chance of getting cervical cancer. Talk to your caregiver about these problems. It is especially important to talk to your caregiver if a new problem develops soon after your last Pap test. In these cases, your caregiver may recommend more frequent screening and Pap tests.   The above recommendations are the same for women who have or have not gotten the vaccine for HPV (Human Papillomavirus).   If you had a hysterectomy for a problem that was not a cancer or a condition that could lead to cancer, then you no longer need Pap tests. However, even if you no longer need a Pap test, a regular exam is a good idea to make sure no other problems are starting.    If you are between ages 79 and 58, and you have had normal Pap tests  going back 10 years, you no longer need Pap tests. However, even if you no longer need a Pap test, a regular exam is a good idea to make sure no other problems are starting.    If you have had past treatment for cervical cancer or a condition that could lead to cancer, you need Pap tests and screening for cancer for at least 20 years after your treatment.   If Pap tests have been discontinued, risk factors (such as a new sexual partner) need to be re-assessed to determine if screening should be resumed.   Some women may need screenings more often if they are at high risk for cervical cancer.   Get mammograms done as per the advice of your caregiver.  SEEK IMMEDIATE MEDICAL CARE IF:  You develop abnormal vaginal bleeding.   You have pain or swelling in your legs, shortness of breath, or chest  pain.   You develop dizziness or headaches.   You have lumps or changes in your breasts or armpits.   You have slurred speech.   You develop weakness or numbness of your arms or legs.   You have pain, burning, or bleeding when urinating.   You develop abdominal pain.  Document Released: 02/13/2003 Document Revised: 05/06/2011 Document Reviewed: 06/03/2010 [facility] Patient Information 2012 [place], [place].

## 2012-02-23 NOTE — Progress Notes (Signed)
Dorothy Lynch 01-Mar-1961 [id number]        51 y.o.  [id number] for annual exam.  Several issues noted below.  Past medical history,surgical history, medications, allergies, family history and social history were all reviewed and documented in the EPIC chart. ROS:  Was performed and pertinent positives and negatives are included in the history.  Exam: [name] assistant Filed Vitals:   02/23/12 1240  BP: 120/72  Height: 5' 8.75" (1.746 m)  Weight: 147 lb (66.679 kg)   General appearance  Normal Skin grossly normal Head/Neck normal with no cervical or supraclavicular adenopathy thyroid normal Lungs  clear Cardiac RR, without RMG Abdominal  soft, nontender, without masses, organomegaly or hernia Breasts  examined lying and sitting without masses, retractions, discharge or axillary adenopathy. Pelvic  Ext/BUS/vagina  normal with white discharge.  Cervix  normal   Uterus  anteverted, normal size, shape and contour, midline and mobile nontender   Adnexa  Without masses or tenderness    Anus and perineum  normal   Rectovaginal  normal sphincter tone without palpated masses or tenderness.    Assessment/Plan:  51 y.o. [id number] perimenopausal female for annual exam.   1. Perimenopausal. Patient on estradiol 2 mg in the morning and 1 mg at bedtime (3 mg total daily) and Prometrium 100 mg nightly. Still has some night sweats but overall is feeling much better without hot flashes during the day. I recommended she try switching and take 2 mg at bedtime and 1 mg in the morning and see how she does with this. Ultimately he may go to 2 mg twice a day if needed. I refilled both x1 year. I again reviewed the issues of HRT in risks to include the WHI study with increased risk of stroke heart attack DVT and breast cancer. Patient's comfortable with this and wants to continue. 2. Vulvar/vaginal itching. 2 weeks of intense itching. Exam and wet prep consistent with yeast. We'll treat with Diflucan 200 mg daily x5  days, follow up if symptoms persist or recur. 3. Mammogram. Patient had mammogram this year and will continue with annual mammography. SBE monthly reviewed. 4. Colonoscopy. Patient had her colonoscopy 2011 will follow up with their recommended interval. 5. Pap smear. No Pap smear done today. Last Pap smear 2012. Numerous normal records in her chart with no history of abnormal Pap smears. We'll plan every 3-5 your screening. 6. Health maintenance. Baseline CBC comprehensive metabolic panel lipid profile urinalysis ordered. Follow up one year, sooner as needed.    Dara Lords MD, 1:09 PM 02/23/2012

## 2012-02-25 LAB — URINE CULTURE: Colony Count: 2000

## 2012-03-11 ENCOUNTER — Other Ambulatory Visit: Payer: Self-pay | Admitting: Gynecology

## 2012-03-13 NOTE — Telephone Encounter (Signed)
Patient was in office 02/23/12 for RGCE.

## 2012-04-30 ENCOUNTER — Other Ambulatory Visit: Payer: Self-pay | Admitting: Gynecology

## 2012-05-01 ENCOUNTER — Other Ambulatory Visit: Payer: Self-pay | Admitting: Gynecology

## 2012-05-01 NOTE — Telephone Encounter (Signed)
rx called in KW 

## 2012-05-19 ENCOUNTER — Other Ambulatory Visit: Payer: Self-pay | Admitting: Gynecology

## 2012-06-02 ENCOUNTER — Other Ambulatory Visit: Payer: Self-pay | Admitting: Gynecology

## 2012-06-02 NOTE — Telephone Encounter (Signed)
Called in KW 

## 2012-07-01 ENCOUNTER — Other Ambulatory Visit: Payer: Self-pay | Admitting: Gynecology

## 2012-07-20 ENCOUNTER — Other Ambulatory Visit: Payer: Self-pay | Admitting: Gynecology

## 2012-07-29 DIAGNOSIS — E039 Hypothyroidism, unspecified: Secondary | ICD-10-CM

## 2012-07-29 HISTORY — DX: Hypothyroidism, unspecified: E03.9

## 2012-08-18 ENCOUNTER — Telehealth: Payer: Self-pay | Admitting: *Deleted

## 2012-08-18 NOTE — Telephone Encounter (Signed)
Pt called and said that Dr. Nicholas Lose diagnosed her with thyroid disease and office notes were going to be faxed, notes never received . I called and left message and Dr. Nicholas Lose office to send record of office note today.

## 2012-08-19 ENCOUNTER — Other Ambulatory Visit: Payer: Self-pay | Admitting: Internal Medicine

## 2012-08-23 ENCOUNTER — Telehealth: Payer: Self-pay | Admitting: *Deleted

## 2012-08-23 ENCOUNTER — Telehealth: Payer: Self-pay | Admitting: Internal Medicine

## 2012-08-23 MED ORDER — LAMOTRIGINE 100 MG PO TABS: ORAL_TABLET | ORAL | Status: DC

## 2012-08-23 NOTE — Telephone Encounter (Signed)
rx sent in electronically 

## 2012-08-23 NOTE — Telephone Encounter (Signed)
Pt informed with the below note. 

## 2012-08-23 NOTE — Telephone Encounter (Signed)
Pt needs refill of lamoTRIgine (LAMICTAL) 100 MG tablet Pt aware of appt needed and made appt for 09/08/12 with Adline Mango. Could you pls call refill to: [facility]: [facility] [address] Pt has been out of meds 3 days.

## 2012-08-23 NOTE — Telephone Encounter (Signed)
Pt had annual scheduled tomorrow she called asking if you would refill her expired rx for Lamictal 100 mg tablet. I told pt that Dr. Cato Mulligan wrote this rx for her and she should follow up with him. Pt said she would prefer to get rx from you. I also suggested that she speak with you about this during OV tomorrow and pt said she has been without medication in 2 days and "NEEDS" it today. I told her I would relay the request. Please advise

## 2012-08-23 NOTE — Telephone Encounter (Signed)
I do not use Lamictal as she really needs to get this through his office. I would suggest she call him today and arrange for refill through his office. It is just not a medication that I routinely use and am comfortable prescribing.

## 2012-08-23 NOTE — Telephone Encounter (Signed)
I received notes from Dr. Nicholas Lose office and gave to TF.

## 2012-08-24 ENCOUNTER — Encounter: Payer: Self-pay | Admitting: Gynecology

## 2012-08-24 ENCOUNTER — Ambulatory Visit (INDEPENDENT_AMBULATORY_CARE_PROVIDER_SITE_OTHER): Payer: BC Managed Care – PPO | Admitting: Gynecology

## 2012-08-24 VITALS — BP 108/70 | HR 80

## 2012-08-24 DIAGNOSIS — E039 Hypothyroidism, unspecified: Secondary | ICD-10-CM

## 2012-08-24 DIAGNOSIS — I83893 Varicose veins of bilateral lower extremities with other complications: Secondary | ICD-10-CM

## 2012-08-24 DIAGNOSIS — L659 Nonscarring hair loss, unspecified: Secondary | ICD-10-CM

## 2012-08-24 DIAGNOSIS — N951 Menopausal and female climacteric states: Secondary | ICD-10-CM

## 2012-08-24 LAB — CBC WITH DIFFERENTIAL/PLATELET
Basophils Absolute: 0.1 10*3/uL (ref 0.0–0.1)
Eosinophils Relative: 3 % (ref 0–5)
Lymphocytes Relative: 28 % (ref 12–46)
Lymphs Abs: 1.6 10*3/uL (ref 0.7–4.0)
Neutro Abs: 3.3 10*3/uL (ref 1.7–7.7)
Neutrophils Relative %: 58 % (ref 43–77)
Platelets: 232 10*3/uL (ref 150–400)
RBC: 3.75 MIL/uL — ABNORMAL LOW (ref 3.87–5.11)
RDW: 13.3 % (ref 11.5–15.5)
WBC: 5.5 10*3/uL (ref 4.0–10.5)

## 2012-08-24 LAB — TSH: TSH: 6.618 u[IU]/mL — ABNORMAL HIGH (ref 0.350–4.500)

## 2012-08-24 LAB — T4: T4, Total: 7 ug/dL (ref 5.0–12.5)

## 2012-08-24 LAB — ESTRADIOL: Estradiol: 112.2 pg/mL

## 2012-08-24 MED ORDER — PROGESTERONE MICRONIZED 100 MG PO CAPS: 100.0000 mg | ORAL_CAPSULE | Freq: Every day | ORAL | Status: DC

## 2012-08-24 NOTE — Patient Instructions (Addendum)
Office will call you with laboratory results and ultimate plan of management

## 2012-08-24 NOTE — Progress Notes (Signed)
Patient presents with several issues: 1. Recent saw her dermatologist in followup of alopecia. Had borderline iron level of 52 with increased TIBC. Also a TSH of 6.0. Previously had a TSH this past fall 3.8 and a hemoglobin of 12.6. She was referred to see me in followup for this. 2. Menopausal symptoms. Patient continues to have night sweats. She does note that her hot flashes during the day have resolved. She is on 2 mg of estradiol at bedtime and Prometrium 100 mg nightly. No bleeding or other issues. 3. Complaints of heaviness in her legs. Has varicose veins and notes as a day progresses she has increasing discomfort and heaviness in her lower extremities. Had seen vein specialist who feel this is secondary to her varicose veins would like to proceed with treatment.   Exam Pulse 80 blood pressure 108/70 HEENT normal without overt alopecia or balding. Thyroid visually normal palpates normal size without masses tenderness or abnormalities. Lungs clear bilaterally Cardiac regular rate no rubs murmurs or gallops Lower extremities with bilateral varicose veins  Assessment and plan: 1. Alopecia. Reviewed possible hormone relationship. Also found to have elevated TSH and marginal iron level. Will check CBC and repeat TSH with T4. Also estradiol level for absorption assessment of her Estrace. Reviewed various possibilities to include adjusting her HRT, addition of Synthroid possible. Did recommend multivitamin with iron regardless on a daily basis. 2. Menopausal symptoms. We'll followup with her estradiol level and rediscuss possible treatment plans. 3. Varicose veins with lower extremity discomfort. Certainly feel treatment by vein specialist warranted given her symptoms. Patient will followup with them for further evaluation and treatment.

## 2012-08-28 ENCOUNTER — Encounter: Payer: Self-pay | Admitting: Gynecology

## 2012-08-28 ENCOUNTER — Other Ambulatory Visit: Payer: Self-pay | Admitting: Gynecology

## 2012-08-28 DIAGNOSIS — D649 Anemia, unspecified: Secondary | ICD-10-CM

## 2012-08-28 MED ORDER — LEVOTHYROXINE SODIUM 50 MCG PO TABS: 50.0000 ug | ORAL_TABLET | Freq: Every day | ORAL | Status: DC

## 2012-09-03 ENCOUNTER — Other Ambulatory Visit: Payer: Self-pay | Admitting: Gynecology

## 2012-09-08 ENCOUNTER — Encounter: Payer: Self-pay | Admitting: Family

## 2012-09-08 ENCOUNTER — Ambulatory Visit (INDEPENDENT_AMBULATORY_CARE_PROVIDER_SITE_OTHER): Payer: BC Managed Care – PPO | Admitting: Family

## 2012-09-08 VITALS — BP 108/60 | HR 62 | Wt 157.0 lb

## 2012-09-08 DIAGNOSIS — E039 Hypothyroidism, unspecified: Secondary | ICD-10-CM

## 2012-09-08 DIAGNOSIS — F329 Major depressive disorder, single episode, unspecified: Secondary | ICD-10-CM

## 2012-09-08 DIAGNOSIS — G4700 Insomnia, unspecified: Secondary | ICD-10-CM

## 2012-09-08 DIAGNOSIS — F32A Depression, unspecified: Secondary | ICD-10-CM

## 2012-09-08 DIAGNOSIS — N951 Menopausal and female climacteric states: Secondary | ICD-10-CM

## 2012-09-08 DIAGNOSIS — F3289 Other specified depressive episodes: Secondary | ICD-10-CM

## 2012-09-08 MED ORDER — ESTRADIOL 1 MG PO TABS: 1.0000 mg | ORAL_TABLET | Freq: Every day | ORAL | Status: DC

## 2012-09-08 MED ORDER — ESCITALOPRAM OXALATE 20 MG PO TABS: 10.0000 mg | ORAL_TABLET | Freq: Every day | ORAL | Status: DC

## 2012-09-08 MED ORDER — ZOLPIDEM TARTRATE 10 MG PO TABS: 10.0000 mg | ORAL_TABLET | Freq: Every evening | ORAL | Status: DC | PRN

## 2012-09-08 MED ORDER — PROGESTERONE MICRONIZED 100 MG PO CAPS: 100.0000 mg | ORAL_CAPSULE | Freq: Every day | ORAL | Status: DC

## 2012-09-08 MED ORDER — VENLAFAXINE HCL ER 37.5 MG PO CP24: 37.5000 mg | ORAL_CAPSULE | Freq: Every day | ORAL | Status: DC

## 2012-09-08 MED ORDER — LEVOTHYROXINE SODIUM 50 MCG PO TABS: 50.0000 ug | ORAL_TABLET | Freq: Every day | ORAL | Status: DC

## 2012-09-08 MED ORDER — TRETINOIN 0.05 % EX CREA: TOPICAL_CREAM | Freq: Every day | CUTANEOUS | Status: DC

## 2012-09-08 MED ORDER — LAMOTRIGINE 100 MG PO TABS: 200.0000 mg | ORAL_TABLET | Freq: Every day | ORAL | Status: DC

## 2012-09-08 NOTE — Patient Instructions (Addendum)

## 2012-09-08 NOTE — Progress Notes (Signed)
Subjective:    Patient ID: Dorothy Lynch, female    DOB: [birth year], 52 y.o.   MRN: [id number]  HPI 52 year old WF, nonsmoker, patient of Dr. Cato Mulligan is in today for a recheck of hypothyroidism, Mood Disorder, and Insomnia. She is currently doing well on all medications. Began taking synthroid 1 week ago. Reports having hair loss prior to finding out her thyroid was underactive.  Requesting to have a refill of Retin A.    Review of Systems  Constitutional: Negative.   HENT: Negative.   Eyes: Negative.   Respiratory: Negative.   Cardiovascular: Negative.   Gastrointestinal: Negative.   Endocrine: Negative.   Genitourinary: Negative.   Musculoskeletal: Negative.   Skin: Negative.   Allergic/Immunologic: Negative.   Neurological: Negative.   Hematological: Negative.   Psychiatric/Behavioral: Negative.    Past Medical History  Diagnosis Date  . Allergy   . Migraines   . Headache   . Cancer     squamous cell skin cancer-shoulder  . Hypothyroidism 07/2012  . Anemia 07/2012    History   Social History  . Marital Status: Married    Spouse Name: N/A    Number of Children: N/A  . Years of Education: N/A   Occupational History  . Not on file.   Social History Main Topics  . Smoking status: Never Smoker   . Smokeless tobacco: Never Used  . Alcohol Use: Yes  . Drug Use: No  . Sexually Active: Yes    Birth Control/ Protection: Pill, Post-menopausal   Other Topics Concern  . Not on file   Social History Narrative   Married   Regular exercise- yes    Past Surgical History  Procedure Laterality Date  . Breast surgery  march 2011    breast [id number], fibrocyctic changes  . Achilles tendon surgery  2001  . Abdominal surgery  1995    inguinal hernia, double    Family History  Problem Relation Age of Onset  . Alcohol abuse    . Breast cancer    . Diabetes    . Diabetes Father   . Hypertension Father   . Stroke Father   . Cancer Maternal Grandmother 63    lung  .  Stroke Maternal Grandfather   . Heart disease Paternal Grandmother     mi  . Stroke Paternal Grandfather     Allergies  Allergen Reactions  . Penicillins   . Sulfonamide Derivatives     Current Outpatient Prescriptions on File Prior to Visit  Medication Sig Dispense Refill  . BIOTIN PO Take by mouth.      . [facility] ALLERGY RELIEF 180 MG tablet TAKE 1 TABLET BY MOUTH EVERY DAY  30 tablet  1  . [facility] ALLERGY RELIEF 180 MG tablet TAKE 1 TABLET (180 MG TOTAL) BY MOUTH DAILY.  30 tablet  1  . Omega-3 Fatty Acids (FISH OIL PO) Take by mouth.      . [DISCONTINUED] fluticasone (VERAMYST) 27.5 MCG/SPRAY nasal spray Place 2 sprays into the nose daily.         No current facility-administered medications on file prior to visit.    BP 108/60  Pulse 62  Wt 157 lb (71.215 kg)  BMI 23.36 kg/m2  SpO2 98%  LMP 05/01/2010chart    Objective:   Physical Exam  Constitutional: She is oriented to person, place, and time. She appears well-developed and well-nourished.  HENT:  Right Ear: External ear normal.  Left Ear: External ear normal.  Nose: Nose normal.  Mouth/Throat: Oropharynx is clear and moist.  Neck: Normal range of motion. Neck supple.  Cardiovascular: Normal rate, regular rhythm and normal heart sounds.   Pulmonary/Chest: Effort normal and breath sounds normal.  Abdominal: Soft. Bowel sounds are normal.  Musculoskeletal: Normal range of motion.  Neurological: She is alert and oriented to person, place, and time.  Skin: Skin is warm and dry.  Psychiatric: She has a normal mood and affect.          Assessment & Plan:  Assessment:  1. Mood Disorder 2. Insomnia 3. Hypothyroidism 4. Hair loss  Plan: Continue current meds. Recheck in 6 weeks for TSH. Exercise daily. Meds refilled. Call the office with any questions or concerns. Recheck as scheduled and as needed.

## 2012-09-12 ENCOUNTER — Other Ambulatory Visit: Payer: BC Managed Care – PPO

## 2012-09-12 DIAGNOSIS — D649 Anemia, unspecified: Secondary | ICD-10-CM

## 2012-09-12 LAB — VITAMIN B12: Vitamin B-12: 413 pg/mL (ref 211–911)

## 2012-09-21 ENCOUNTER — Other Ambulatory Visit: Payer: Self-pay | Admitting: Internal Medicine

## 2012-09-29 ENCOUNTER — Other Ambulatory Visit: Payer: BC Managed Care – PPO

## 2012-09-29 ENCOUNTER — Other Ambulatory Visit: Payer: Self-pay | Admitting: Gynecology

## 2012-09-29 ENCOUNTER — Other Ambulatory Visit: Payer: Self-pay | Admitting: *Deleted

## 2012-09-29 DIAGNOSIS — E039 Hypothyroidism, unspecified: Secondary | ICD-10-CM

## 2012-09-29 NOTE — Telephone Encounter (Signed)
I noticed patient had TSH drawn today. Do you want to refill this?  On 09/08/12 primary care prescribed #30 with one refill so I do not think she is in stat need of Rx.

## 2012-10-02 ENCOUNTER — Telehealth: Payer: Self-pay | Admitting: *Deleted

## 2012-10-02 NOTE — Telephone Encounter (Signed)
Pt came back to have TSH recheck on 09/29/12 rx for synthroid 50 mcg #30 with 3 refill sent to pharmacy. Pt asked how often  would you like to have TSH recheck?  Please advise

## 2012-10-02 NOTE — Telephone Encounter (Signed)
Pt informed with the below note. 

## 2012-10-02 NOTE — Telephone Encounter (Signed)
At her annual exam

## 2012-10-05 ENCOUNTER — Other Ambulatory Visit: Payer: Self-pay | Admitting: Anesthesiology

## 2012-10-05 DIAGNOSIS — Z1211 Encounter for screening for malignant neoplasm of colon: Secondary | ICD-10-CM

## 2012-10-05 DIAGNOSIS — D649 Anemia, unspecified: Secondary | ICD-10-CM

## 2012-10-17 ENCOUNTER — Encounter: Payer: Self-pay | Admitting: Gynecology

## 2012-12-13 ENCOUNTER — Other Ambulatory Visit: Payer: Self-pay | Admitting: Internal Medicine

## 2012-12-21 ENCOUNTER — Other Ambulatory Visit: Payer: Self-pay | Admitting: Internal Medicine

## 2013-01-19 ENCOUNTER — Other Ambulatory Visit: Payer: Self-pay | Admitting: Family

## 2013-01-19 NOTE — Telephone Encounter (Signed)
Pt seen Padonda on 09/08/12 but has not seen you since 01/18/11.  Is this ok to refill?  Padonda refilled it in April with #90 no refills

## 2013-02-23 ENCOUNTER — Encounter: Payer: BC Managed Care – PPO | Admitting: Gynecology

## 2013-02-26 ENCOUNTER — Ambulatory Visit (INDEPENDENT_AMBULATORY_CARE_PROVIDER_SITE_OTHER): Payer: BC Managed Care – PPO | Admitting: Internal Medicine

## 2013-02-26 ENCOUNTER — Encounter: Payer: Self-pay | Admitting: Internal Medicine

## 2013-02-26 VITALS — BP 104/64 | HR 65 | Temp 98.2°F | Resp 12 | Ht 69.0 in | Wt 153.0 lb

## 2013-02-26 DIAGNOSIS — E039 Hypothyroidism, unspecified: Secondary | ICD-10-CM

## 2013-02-26 LAB — TSH: TSH: 1.05 u[IU]/mL (ref 0.35–5.50)

## 2013-02-26 NOTE — Patient Instructions (Addendum)
Please stop at the lab. We will modify the dose of Synthroid based on the results. You can try Magnesium and a B-complex.

## 2013-02-26 NOTE — Progress Notes (Signed)
Patient ID: Dorothy Lynch, female   DOB: Sep 20, 1960, 52 y.o.   MRN: [id number]   HPI  Dorothy Lynch is a 52 y.o.-year-old female, self-referred, for evaluation for hypothyroidism and hair loss.  Pt. has been dx with hypothyroidism in 07/2012, during evaluation by dermatology (Dr Nicholas Lose) for alopecia. At that time, a TSH was 6.6. She was started on Levothyroxine 50 mcg 09/01/2012, taken: - fasting - with water - 1h later: fish oil, biotin, progesterone - separated by >30 min from b'fast  - no calcium, iron, MVI, PPIs  I reviewed pt's thyroid tests: Lab Results  Component Value Date   TSH 2.034 09/29/2012   TSH 6.618* 08/24/2012   TSH 3.862 02/23/2012   TSH 4.216 08/25/2011    Pt denies feeling nodules in neck, hoarseness, dysphagia/odynophagia, SOB with lying down; she c/o: - +++ hair falling, mostly when washing or combing - she believes she has lost 1/3 of her hair - + dry skin - no weight gain - + fatigue - + constipation - on laxatives  - problems with concentration - + anxiety/+ depression - improved lately on meds (Dr Chiquita Loth) - + insomnia - wakes up 3x a night - nocturia x1, but wakes up in a sweat - hot flushes better  Jogging/walking  5x a week.   She has a + FH of thyroid disorders in: mother (hypothyroid). No FH of thyroid cancer. No h/o radiation tx to head or neck.  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  ROS:  Constitutional: see above Eyes: no blurry vision, no xerophthalmia ENT: no sore throat, no nodules palpated in throat, no dysphagia/odynophagia, no hoarseness Cardiovascular: no CP/SOB/palpitations/leg swelling Respiratory: no cough/SOB Gastrointestinal: no N/V/D/C Musculoskeletal: no muscle/joint aches Skin: no rashes, + hair falling Neurological: no tremors/numbness/tingling/dizziness Psychiatric: + depression/+ anxiety  Past Medical  History  Diagnosis Date  . Allergy   . Migraines   . Headache(784.0)   . Cancer     squamous cell skin cancer-shoulder  . Hypothyroidism 07/2012  . Anemia 07/2012   Past Surgical History  Procedure Laterality Date  . Breast surgery  march 2011    breast bx-B-9, fibrocyctic changes  . Achilles tendon surgery  2001  . Abdominal surgery  1995    inguinal hernia, double   History   Social History  . Marital Status: Married    Spouse Name: N/A    Number of Children: 2   Occupational History  . Sales rep - [employer]   Social History Main Topics  . Smoking status: Never Smoker   . Smokeless tobacco: Never Used  . Alcohol Use: 3.5 oz/week    7 drink(s) per week - 1 a day  . Drug Use: No  . Sexual Activity: Yes    Partners: Female    Birth Control/ Protection: Pill, Post-menopausal   Other Topics Concern  . Not on file   Social History Narrative   Married   Regular exercise- yes   Caffeine use: coffee daily   Current Outpatient Prescriptions on File Prior to Visit  Medication Sig Dispense Refill  . BIOTIN PO Take by mouth.      . [facility] ALLERGY RELIEF 180 MG tablet TAKE 1 TABLET BY MOUTH EVERY DAY  30 tablet  1  . escitalopram (LEXAPRO) 20 MG tablet Take 0.5 tablets (10 mg total) by mouth daily.  90 tablet  1  .  estradiol (ESTRACE) 1 MG tablet Take 1 tablet (1 mg total) by mouth daily.  90 tablet  1  . lamoTRIgine (LAMICTAL) 100 MG tablet TAKE 1 TABLET BY MOUTH TWICE A DAY  60 tablet  0  . levothyroxine (SYNTHROID, LEVOTHROID) 50 MCG tablet TAKE 1 TABLET BY MOUTH ONCE DAILY  30 tablet  3  . Omega-3 Fatty Acids (FISH OIL PO) Take by mouth.      . progesterone (PROMETRIUM) 100 MG capsule Take 1 capsule (100 mg total) by mouth at bedtime.  90 capsule  1  . tretinoin (RETIN-A) 0.05 % cream Apply topically at bedtime.  45 g  1  . venlafaxine XR (EFFEXOR-XR) 37.5 MG 24 hr capsule Take 1 capsule (37.5 mg total) by mouth daily.  90 capsule  1  . zolpidem (AMBIEN) 10 MG tablet  TAKE 1 TABLET BY MOUTH AT BEDTIME AS NEEDED FOR SLEEP  90 tablet  0  . [DISCONTINUED] fluticasone (VERAMYST) 27.5 MCG/SPRAY nasal spray Place 2 sprays into the nose daily.         No current facility-administered medications on file prior to visit.   Allergies  Allergen Reactions  . Penicillins   . Sulfonamide Derivatives     Family History  Problem Relation Age of Onset  . Alcohol abuse    . Breast cancer    . Diabetes    . Diabetes Father   . Hypertension Father   . Stroke Father   . Cancer Maternal Grandmother 63    lung  . Stroke Maternal Grandfather   . Heart disease Paternal Grandmother     mi  . Stroke Paternal Grandfather   . Thyroid disease Mother    PE: BP 104/64  Pulse 65  Temp(Src) 98.2 F (36.8 C) (Oral)  Resp 12  Ht 5\' 9"  (1.753 m)  Wt 153 lb (69.4 kg)  BMI 22.58 kg/m2  SpO2 97%  LMP 09/28/2008 Wt Readings from Last 3 Encounters:  02/26/13 153 lb (69.4 kg)  09/08/12 157 lb (71.215 kg)  02/23/12 147 lb (66.679 kg)   Constitutional: normal weight, in NAD Eyes: PERRLA, EOMI, no exophthalmos ENT: moist mucous membranes, no thyromegaly, no cervical lymphadenopathy Cardiovascular: RRR, No MRG Respiratory: CTA B Gastrointestinal: abdomen soft, NT, ND, BS+ Musculoskeletal: no deformities, strength intact in all 4 Skin: moist, warm, no rashes; thick, dry hair Neurological: no tremor with outstretched hands, DTR normal in all 4  ASSESSMENT: 1. Hypothyroidism  2. Hair loss  PLAN:  1. And 2.  Patient with recently diagnosed hypothyroidism, on low dose levothyroxine therapy. She appears euthyroid. She does, however, have hair loss, and this could not be improved with Levothyroxine, despite normalization of TFTs. We did dscuss that menopause can also affect the hair root estrogen receptors, and she can experience weight loss despite being on HRT. Other factors like diet, vitamin deficiencies (she is on B1 replacement) sleep, environmental factors, can also  contribute. I advised her she can take Magnesium and B complex instead of B1 vitamin only to see if helps. - We discussed about correct intake of levothyroxine, fasting, with water, separated by at least 30 minutes from breakfast, and separated by more than 4 hours from calcium, iron, multivitamins, acid reflux medications (PPIs). - She does not appear to have a goiter, thyroid nodules, or neck compression symptoms - we will check thyroid tests today: TSH, free T4 - If these are abnormal, she will need to return in 6-8 weeks for repeat labs - If these are  normal, she will continue to see her primary care physician for hypothyroidism management, unless she would like to return here for followup.   Orders Placed This Encounter  Procedures  . TSH  . T4, free   Office Visit on 02/26/2013  Component Date Value Range Status  . TSH 02/26/2013 1.05  0.35 - 5.50 uIU/mL Final  . Free T4 02/26/2013 0.83  0.60 - 1.60 ng/dL Final  TFTs normal. Continue same Levothyroxine dose.

## 2013-02-27 ENCOUNTER — Telehealth: Payer: Self-pay | Admitting: *Deleted

## 2013-02-27 NOTE — Telephone Encounter (Signed)
Called pt and advised her that all her levels are within normal range. Pt requested exact numbers, told her there numbers. Pt pleased.

## 2013-03-04 ENCOUNTER — Other Ambulatory Visit: Payer: Self-pay | Admitting: Gynecology

## 2013-03-06 ENCOUNTER — Telehealth: Payer: Self-pay | Admitting: *Deleted

## 2013-03-06 MED ORDER — LEVOTHYROXINE SODIUM 50 MCG PO TABS: 50.0000 ug | ORAL_TABLET | Freq: Every day | ORAL | Status: DC

## 2013-03-06 NOTE — Telephone Encounter (Signed)
Okay to refill #30 one by mouth daily. No refill. Patient is overdue for annual exam with me and needs to schedule.

## 2013-03-06 NOTE — Telephone Encounter (Signed)
Rx sent, left on pt voicemail to please schedule annual exam.

## 2013-03-06 NOTE — Telephone Encounter (Signed)
Pt rx for synthroid 50 mcg was denied because pt saw Dr.Gherge at [facility] on 02/26/13. Pt said she went to see Dr.Gherge only because issues with hair loss. Pt recent thyroid level was normal, and she would like you to continue her thyroid medication refills. Okay to refill? Please advise

## 2013-03-12 ENCOUNTER — Other Ambulatory Visit: Payer: Self-pay | Admitting: Internal Medicine

## 2013-03-22 ENCOUNTER — Encounter: Payer: BC Managed Care – PPO | Admitting: Gynecology

## 2013-03-26 ENCOUNTER — Other Ambulatory Visit: Payer: Self-pay | Admitting: Internal Medicine

## 2013-03-29 ENCOUNTER — Other Ambulatory Visit: Payer: Self-pay | Admitting: Gynecology

## 2013-04-08 ENCOUNTER — Other Ambulatory Visit: Payer: Self-pay | Admitting: Gynecology

## 2013-04-20 ENCOUNTER — Other Ambulatory Visit: Payer: Self-pay | Admitting: Gynecology

## 2013-04-20 NOTE — Telephone Encounter (Signed)
Dorothy Lords, MD at 03/06/2013 3:24 PM    Status: Signed        Okay to refill #30 one by mouth daily. No refill. Patient is overdue for annual exam with me and needs to schedule   Patient has CE scheduled with you for 05/03/13 but has rescheduled twice.

## 2013-04-20 NOTE — Telephone Encounter (Signed)
Has CE 05/03/13.

## 2013-04-30 ENCOUNTER — Telehealth: Payer: Self-pay | Admitting: Internal Medicine

## 2013-04-30 NOTE — Telephone Encounter (Signed)
error 

## 2013-05-01 ENCOUNTER — Telehealth: Payer: Self-pay | Admitting: Internal Medicine

## 2013-05-01 MED ORDER — LAMOTRIGINE 100 MG PO TABS: ORAL_TABLET | ORAL | Status: DC

## 2013-05-01 NOTE — Telephone Encounter (Signed)
Pt has not been seen since 2012

## 2013-05-01 NOTE — Telephone Encounter (Signed)
Pt saw np in 2014

## 2013-05-01 NOTE — Telephone Encounter (Signed)
rx sent in electronically 

## 2013-05-01 NOTE — Telephone Encounter (Signed)
Pt need refill on lamotrigine. I informed pt that  she needs to make ov. Pt stated check with MD about appt  [facility]

## 2013-05-03 ENCOUNTER — Ambulatory Visit (INDEPENDENT_AMBULATORY_CARE_PROVIDER_SITE_OTHER): Payer: BC Managed Care – PPO | Admitting: Gynecology

## 2013-05-03 ENCOUNTER — Encounter: Payer: Self-pay | Admitting: Gynecology

## 2013-05-03 VITALS — BP 116/74 | Ht 69.0 in | Wt 153.0 lb

## 2013-05-03 DIAGNOSIS — E039 Hypothyroidism, unspecified: Secondary | ICD-10-CM

## 2013-05-03 DIAGNOSIS — N951 Menopausal and female climacteric states: Secondary | ICD-10-CM

## 2013-05-03 DIAGNOSIS — N898 Other specified noninflammatory disorders of vagina: Secondary | ICD-10-CM

## 2013-05-03 DIAGNOSIS — Z01419 Encounter for gynecological examination (general) (routine) without abnormal findings: Secondary | ICD-10-CM

## 2013-05-03 DIAGNOSIS — Z79890 Hormone replacement therapy (postmenopausal): Secondary | ICD-10-CM

## 2013-05-03 LAB — CBC WITH DIFFERENTIAL/PLATELET
Basophils Absolute: 0 10*3/uL (ref 0.0–0.1)
Basophils Relative: 0 % (ref 0–1)
Eosinophils Relative: 6 % — ABNORMAL HIGH (ref 0–5)
HCT: 36 % (ref 36.0–46.0)
Hemoglobin: 12.3 g/dL (ref 12.0–15.0)
Lymphs Abs: 1.8 10*3/uL (ref 0.7–4.0)
MCHC: 34.2 g/dL (ref 30.0–36.0)
MCV: 95.5 fL (ref 78.0–100.0)
Monocytes Absolute: 0.6 10*3/uL (ref 0.1–1.0)
Neutrophils Relative %: 58 % (ref 43–77)
RBC: 3.77 MIL/uL — ABNORMAL LOW (ref 3.87–5.11)
RDW: 13.7 % (ref 11.5–15.5)

## 2013-05-03 LAB — WET PREP FOR TRICH, YEAST, CLUE

## 2013-05-03 LAB — COMPREHENSIVE METABOLIC PANEL
AST: 17 U/L (ref 0–37)
Alkaline Phosphatase: 53 U/L (ref 39–117)
BUN: 16 mg/dL (ref 6–23)
Calcium: 8.9 mg/dL (ref 8.4–10.5)
Creat: 0.79 mg/dL (ref 0.50–1.10)
Sodium: 136 mEq/L (ref 135–145)

## 2013-05-03 LAB — LIPID PANEL
HDL: 91 mg/dL (ref >39)
LDL Cholesterol: 57 mg/dL (ref 0–99)
Total CHOL/HDL Ratio: 2 Ratio
VLDL: 36 mg/dL (ref 0–40)

## 2013-05-03 MED ORDER — ZOLPIDEM TARTRATE 10 MG PO TABS: ORAL_TABLET | ORAL | Status: DC

## 2013-05-03 MED ORDER — PROGESTERONE MICRONIZED 100 MG PO CAPS: 100.0000 mg | ORAL_CAPSULE | Freq: Every day | ORAL | Status: DC

## 2013-05-03 MED ORDER — VENLAFAXINE HCL ER 37.5 MG PO CP24: ORAL_CAPSULE | ORAL | Status: DC

## 2013-05-03 MED ORDER — FLUCONAZOLE 150 MG PO TABS: 150.0000 mg | ORAL_TABLET | Freq: Once | ORAL | Status: DC

## 2013-05-03 MED ORDER — ESTRADIOL 1 MG PO TABS: ORAL_TABLET | ORAL | Status: DC

## 2013-05-03 MED ORDER — LEVOTHYROXINE SODIUM 50 MCG PO TABS: ORAL_TABLET | ORAL | Status: DC

## 2013-05-03 NOTE — Patient Instructions (Signed)
Followup in one year for annual exam, sooner as needed. 

## 2013-05-03 NOTE — Progress Notes (Signed)
Neeka Urista 20-May-1961 [id number]        52 y.o.  [id number] for annual exam.  Several issues noted below.  Past medical history,surgical history, problem list, medications, allergies, family history and social history were all reviewed and documented in the EPIC chart.  ROS:  Performed and pertinent positives and negatives are included in the history, assessment and plan .  Exam: Kim assistant Filed Vitals:   05/03/13 1605  BP: 116/74  Height: 5\' 9"  (1.753 m)  Weight: 153 lb (69.4 kg)   General appearance  Normal Skin grossly normal Head/Neck normal with no cervical or supraclavicular adenopathy thyroid normal Lungs  clear Cardiac RR, without RMG Abdominal  soft, nontender, without masses, organomegaly or hernia Breasts  examined lying and sitting without masses, retractions, discharge or axillary adenopathy. Pelvic  Ext/BUS/vagina  Normal with whitish discharge  Cervix  Normal   Uterus  anteverted, normal size, shape and contour, midline and mobile nontender   Adnexa  Without masses or tenderness    Anus and perineum  Normal   Rectovaginal  Normal sphincter tone without palpated masses or tenderness.    Assessment/Plan:  52 y.o. [id number] female for annual exam.   1. White discharge with vaginal itching. Wet prep is positive for yeast. Will treat with Diflucan 150 mg area she does get frequent recurrences I gave her #10 to use when necessary this coming year. 2. Hypothyroid. She's on Synthroid 50 mcg. Check TSH today. Refill her Synthroid x1 year. 3. Complaints of alopecia. Hair is thinning per her history. She has seen dermatology. No overt evidence of disease on exam. Patient will monitor at present.   4. HRT/menopausal symptoms. Patient's on estradiol 2 mg at bedtime and Prometrium 100 mg. Doing no bleeding. Much improved as far as hot flushes and night sweats. Still is having some night sweats. Patient's not interested in increasing her dose at this point. I again reviewed the  risks benefits to include WHI study and risks of stroke heart attack DVT breast cancer. Patient's comfortable continuing I refilled her times a year. 5. History of anxiety/depression. I refilled her Effexor 37.5 mg daily. Also is using Lamictal and Lexapro through Dr. Cato Mulligan. Did not feel comfortable refilling these and she can follow up with him in reference to that. 6. Ambien 10 mg for insomnia. Reports getting these through Dr. Cato Mulligan but I did refill these for her #90 with one refill. 7. Pap smear 2012. No Pap smear done today. Plan Pap smear next year at 3 year interval. No history of abnormal Pap smears previously. 8. Mammography 09/2012. Continue with annual mammography. SBE monthly reviewed. 9. Colonoscopy 2011. Repeat that they are recommended interval. 10. DEXA never. We'll plan further into the menopause. Increase calcium vitamin D discussed. Check vitamin D level today. 11. Health maintenance. Baseline CBC comprehensive metabolic panel lipid profile TSH urinalysis vitamin D ordered. Followup one year, sooner as needed.   Note: This document was prepared with digital dictation and possible smart phrase technology. Any transcriptional errors that result from this process are unintentional.   Dara Lords MD, 4:52 PM 05/03/2013

## 2013-05-04 LAB — VITAMIN D 25 HYDROXY (VIT D DEFICIENCY, FRACTURES): Vit D, 25-Hydroxy: 45 ng/mL (ref 30–89)

## 2013-05-04 LAB — URINALYSIS W MICROSCOPIC + REFLEX CULTURE
Bacteria, UA: NONE SEEN
Glucose, UA: NEGATIVE mg/dL
Hgb urine dipstick: NEGATIVE
Ketones, ur: NEGATIVE mg/dL
Leukocytes, UA: NEGATIVE
Protein, ur: NEGATIVE mg/dL
Squamous Epithelial / LPF: NONE SEEN
Urobilinogen, UA: 0.2 mg/dL (ref 0.0–1.0)

## 2013-05-29 ENCOUNTER — Other Ambulatory Visit: Payer: Self-pay

## 2013-07-02 ENCOUNTER — Telehealth: Payer: Self-pay | Admitting: Internal Medicine

## 2013-07-02 NOTE — Telephone Encounter (Signed)
[facility] requesting refill of lamoTRIgine (LAMICTAL) 100 MG tablet #60, last filled 05/28/13

## 2013-07-03 MED ORDER — LAMOTRIGINE 100 MG PO TABS: ORAL_TABLET | ORAL | Status: DC

## 2013-07-03 NOTE — Telephone Encounter (Signed)
rx sent in electronically 

## 2013-07-31 ENCOUNTER — Other Ambulatory Visit: Payer: Self-pay | Admitting: Internal Medicine

## 2013-08-22 ENCOUNTER — Telehealth: Payer: Self-pay | Admitting: *Deleted

## 2013-08-22 MED ORDER — PROGESTERONE MICRONIZED 100 MG PO CAPS: 100.0000 mg | ORAL_CAPSULE | Freq: Every day | ORAL | Status: DC

## 2013-08-22 NOTE — Telephone Encounter (Signed)
Pt called stating the brand progesterone was sent to pharmacy, pt would like generic. This was done left on voicemail this has been done.

## 2013-08-27 ENCOUNTER — Other Ambulatory Visit: Payer: Self-pay

## 2013-08-28 MED ORDER — ZOLPIDEM TARTRATE 10 MG PO TABS: ORAL_TABLET | ORAL | Status: DC

## 2013-08-28 MED ORDER — VENLAFAXINE HCL ER 37.5 MG PO CP24: ORAL_CAPSULE | ORAL | Status: DC

## 2013-08-28 NOTE — Telephone Encounter (Signed)
Called into pharmacy

## 2013-09-03 ENCOUNTER — Other Ambulatory Visit: Payer: Self-pay | Admitting: Internal Medicine

## 2013-09-04 ENCOUNTER — Telehealth: Payer: Self-pay | Admitting: Internal Medicine

## 2013-09-04 MED ORDER — LAMOTRIGINE 100 MG PO TABS: ORAL_TABLET | ORAL | Status: DC

## 2013-09-04 NOTE — Telephone Encounter (Signed)
Pt is needing new rx lamoTRIgine (LAMICTAL) 100 MG tablet, and levothyroxine (synothroid) 50 mg, [facility] to [facility].

## 2013-09-04 NOTE — Telephone Encounter (Signed)
lamictal filled for pt

## 2013-10-02 ENCOUNTER — Other Ambulatory Visit: Payer: Self-pay | Admitting: Internal Medicine

## 2013-10-03 ENCOUNTER — Telehealth: Payer: Self-pay | Admitting: Internal Medicine

## 2013-10-03 NOTE — Telephone Encounter (Signed)
Pt needs an appt

## 2013-10-03 NOTE — Telephone Encounter (Signed)
Pt rx on lamoTRIgine (LAMICTAL) 100 MG tablet   and  fexofenadine   Pharmacy;;  [facility] [phone]

## 2013-10-04 ENCOUNTER — Encounter: Payer: Self-pay | Admitting: Gynecology

## 2013-10-04 ENCOUNTER — Ambulatory Visit (INDEPENDENT_AMBULATORY_CARE_PROVIDER_SITE_OTHER): Payer: 59 | Admitting: Gynecology

## 2013-10-04 DIAGNOSIS — N644 Mastodynia: Secondary | ICD-10-CM

## 2013-10-04 MED ORDER — LAMOTRIGINE 100 MG PO TABS: ORAL_TABLET | ORAL | Status: DC

## 2013-10-04 NOTE — Telephone Encounter (Signed)
Left message to call and reschedule appt per [facility] before she can have a refill

## 2013-10-04 NOTE — Progress Notes (Signed)
Dorothy Lynch 22-Jul-1960 [id number]        53 y.o.  [id number] with one to two-week history of bilateral breast tenderness. No palpable masses or nipple discharge. No history of same before. Is on HRT to include 2 mg Estrace at bedtime and 1 mg in the morning. Prometrium 100 mg at night. No bleeding.  Past medical history,surgical history, problem list, medications, allergies, family history and social history were all reviewed and documented in the EPIC chart.  Directed ROS with pertinent positives and negatives documented in the history of present illness/assessment and plan.  Exam: Kim assistant General appearance  Normal Breasts examined lying and sitting without masses retractions discharge adenopathy.  Assessment/Plan:  53 y.o. [id number] with bilateral breast tenderness over the last 2 weeks. Exam is normal. Suspect physiologic/HRT. Options to include stopping HRT transiently reviewed. Patient said it really isn't bothersome and she preferred just to continue. She is due for her mammogram now and will schedule a diagnostic mammogram for completeness. Patient will followup if the symptoms continue.   Note: This document was prepared with digital dictation and possible smart phrase technology. Any transcriptional errors that result from this process are unintentional.   Anastasio Auerbach MD, 4:34 PM 10/04/2013

## 2013-10-04 NOTE — Telephone Encounter (Signed)
Pt has scheduled appt for 10/05/13 with [facility], pt states she is completely out of meds and wants to know if rx can be called in.

## 2013-10-04 NOTE — Patient Instructions (Signed)
Followup for your diagnostic mammogram as arranged by office.

## 2013-10-04 NOTE — Telephone Encounter (Signed)
rx sent in electronically, pt aware 

## 2013-10-05 ENCOUNTER — Telehealth: Payer: Self-pay | Admitting: *Deleted

## 2013-10-05 ENCOUNTER — Ambulatory Visit: Payer: 59 | Admitting: Physician Assistant

## 2013-10-05 NOTE — Telephone Encounter (Signed)
Appointment 10/08/13 @ 10:30 am at [facility] , order faxed.

## 2013-10-05 NOTE — Telephone Encounter (Signed)
Message copied by Thamas Jaegers on Fri Oct 05, 2013  8:55 AM ------      Message from: Anastasio Auerbach      Created: Thu Oct 04, 2013  4:32 PM       Arrange diagnostic bilateral mammogram at [facility] reference bilateral breast tenderness no palpable abnormalities. ------

## 2013-10-11 ENCOUNTER — Ambulatory Visit: Payer: 59 | Admitting: Physician Assistant

## 2013-10-12 ENCOUNTER — Encounter: Payer: Self-pay | Admitting: Gynecology

## 2013-10-12 ENCOUNTER — Ambulatory Visit (INDEPENDENT_AMBULATORY_CARE_PROVIDER_SITE_OTHER): Payer: 59 | Admitting: Physician Assistant

## 2013-10-12 ENCOUNTER — Encounter: Payer: Self-pay | Admitting: Physician Assistant

## 2013-10-12 ENCOUNTER — Ambulatory Visit: Payer: 59 | Admitting: Physician Assistant

## 2013-10-12 VITALS — BP 100/70 | HR 76 | Temp 98.0°F | Resp 18 | Wt 156.0 lb

## 2013-10-12 DIAGNOSIS — Z09 Encounter for follow-up examination after completed treatment for conditions other than malignant neoplasm: Secondary | ICD-10-CM

## 2013-10-12 DIAGNOSIS — H6980 Other specified disorders of Eustachian tube, unspecified ear: Secondary | ICD-10-CM

## 2013-10-12 DIAGNOSIS — B379 Candidiasis, unspecified: Secondary | ICD-10-CM

## 2013-10-12 DIAGNOSIS — H6990 Unspecified Eustachian tube disorder, unspecified ear: Secondary | ICD-10-CM

## 2013-10-12 MED ORDER — FLUCONAZOLE 150 MG PO TABS: 150.0000 mg | ORAL_TABLET | Freq: Once | ORAL | Status: DC

## 2013-10-12 MED ORDER — FEXOFENADINE HCL 180 MG PO TABS: ORAL_TABLET | ORAL | Status: DC

## 2013-10-12 MED ORDER — LAMOTRIGINE 100 MG PO TABS: ORAL_TABLET | ORAL | Status: DC

## 2013-10-12 NOTE — Patient Instructions (Signed)
All of your medications should have refills now.  Fluconazole one pill every 72 hours up to 3 pills total for yeast infection as long as symptoms persist.  Force NON dairy fluids, drinking plenty of water is best.    Over the Counter Flonase OR Nasacort AQ 1 spray in each nostril twice a day as needed. Use the "crossover" technique into opposite nostril spraying toward opposite ear @ 45 degree angle, not straight up into nostril.   Plain Over the Counter Allegra (NOT D )  160 daily , OR Loratidine 10 mg , OR Zyrtec 10 mg @ bedtime  as needed for itchy eyes & sneezing.  Saline Irrigation and Saline Sprays can also help reduce symptoms.  Followup to clinic as needed.  Candida Infection, Adult A candida infection (also called yeast, fungus and Monilia infection) is an overgrowth of yeast that can occur anywhere on the body. A yeast infection commonly occurs in warm, moist body areas. Usually, the infection remains localized but can spread to become a systemic infection. A yeast infection may be a sign of a more severe disease such as diabetes, leukemia, or AIDS. A yeast infection can occur in both men and women. In women, Candida vaginitis is a vaginal infection. It is one of the most common causes of vaginitis. Men usually do not have symptoms or know they have an infection until other problems develop. Men may find out they have a yeast infection because their sex partner has a yeast infection. Uncircumcised men are more likely to get a yeast infection than circumcised men. This is because the uncircumcised glans is not exposed to air and does not remain as dry as that of a circumcised glans. Older adults may develop yeast infections around dentures. CAUSES  Women  Antibiotics.  Steroid medication taken for a long time.  Being overweight (obese).  Diabetes.  Poor immune condition.  Certain serious medical conditions.  Immune suppressive medications for organ transplant  patients.  Chemotherapy.  Pregnancy.  Menstration.  Stress and fatigue.  Intravenous drug use.  Oral contraceptives.  Wearing tight-fitting clothes in the crotch area.  Catching it from a sex partner who has a yeast infection.  Spermicide.  Intravenous, urinary, or other catheters. Men  Catching it from a sex partner who has a yeast infection.  Having oral or anal sex with a person who has the infection.  Spermicide.  Diabetes.  Antibiotics.  Poor immune system.  Medications that suppress the immune system.  Intravenous drug use.  Intravenous, urinary, or other catheters. SYMPTOMS  Women  Thick, white vaginal discharge.  Vaginal itching.  Redness and swelling in and around the vagina.  Irritation of the lips of the vagina and perineum.  Blisters on the vaginal lips and perineum.  Painful sexual intercourse.  Low blood sugar (hypoglycemia).  Painful urination.  Bladder infections.  Intestinal problems such as constipation, indigestion, bad breath, bloating, increase in gas, diarrhea, or loose stools. Men  Men may develop intestinal problems such as constipation, indigestion, bad breath, bloating, increase in gas, diarrhea, or loose stools.  Dry, cracked skin on the penis with itching or discomfort.  Jock itch.  Dry, flaky skin.  Athlete's foot.  Hypoglycemia. DIAGNOSIS  Women  A history and an exam are performed.  The discharge may be examined under a microscope.  A culture may be taken of the discharge. Men  A history and an exam are performed.  Any discharge from the penis or areas of cracked skin will  be looked at under the microscope and cultured.  Stool samples may be cultured. TREATMENT  Women  Vaginal antifungal suppositories and creams.  Medicated creams to decrease irritation and itching on the outside of the vagina.  Warm compresses to the perineal area to decrease swelling and discomfort.  Oral antifungal  medications.  Medicated vaginal suppositories or cream for repeated or recurrent infections.  Wash and dry the irritation areas before applying the cream.  Eating yogurt with lactobacillus may help with prevention and treatment.  Sometimes painting the vagina with gentian violet solution may help if creams and suppositories do not work. Men  Antifungal creams and oral antifungal medications.  Sometimes treatment must continue for 30 days after the symptoms go away to prevent recurrence. HOME CARE INSTRUCTIONS  Women  Use cotton underwear and avoid tight-fitting clothing.  Avoid colored, scented toilet paper and deodorant tampons or pads.  Do not douche.  Keep your diabetes under control.  Finish all the prescribed medications.  Keep your skin clean and dry.  Consume milk or yogurt with lactobacillus active culture regularly. If you get frequent yeast infections and think that is what the infection is, there are over-the-counter medications that you can get. If the infection does not show healing in 3 days, talk to your caregiver.  Tell your sex partner you have a yeast infection. Your partner may need treatment also, especially if your infection does not clear up or recurs. Men  Keep your skin clean and dry.  Keep your diabetes under control.  Finish all prescribed medications.  Tell your sex partner that you have a yeast infection so they can be treated if necessary. SEEK MEDICAL CARE IF:   Your symptoms do not clear up or worsen in one week after treatment.  You have an oral temperature above 102 F (38.9 C).  You have trouble swallowing or eating for a prolonged time.  You develop blisters on and around your vagina.  You develop vaginal bleeding and it is not your menstrual period.  You develop abdominal pain.  You develop intestinal problems as mentioned above.  You get weak or lightheaded.  You have painful or increased urination.  You have pain  during sexual intercourse. MAKE SURE YOU:   Understand these instructions.  Will watch your condition.  Will get help right away if you are not doing well or get worse. Document Released: 06/24/2004 Document Revised: 08/09/2011 Document Reviewed: 10/06/2009 [facility] Patient Information 2014 [place].

## 2013-10-12 NOTE — Progress Notes (Signed)
Pre visit review using our clinic review tool, if applicable. No additional management support is needed unless otherwise documented below in the visit note. 

## 2013-10-12 NOTE — Progress Notes (Signed)
Subjective:    Patient ID: Dorothy Lynch, female    DOB: Sep 11, 1960, 53 y.o.   MRN: [id number]  HPI Patient is a 53 year old Caucasian female presenting to the clinic for followup for medication refill. patient is healthy, exercises frequently. Patient has a history of mood disorder for which she takes Lamictal, for which she needs refill. She also has seasonal allergies and is requesting a refill of Allegra. Patient denies any symptoms of fevers, chills, nausea, vomiting, diarrhea, shortness of breath, chest pain, and skin rash.  Patient is also complaining today of some balance issues associated with ear popping. Patient states that she has been having the symptoms more frequently recently, and associates them with her allergies. Patient states that this is more of a nuisance than a problem, and she has been trying to increase her balance with yoga. She denies dizziness, vertigo, headache, nausea, and falls.  Patient is also complaining today of vaginal irritation and itching with discharge for the past 2 weeks consistent with past yeast infections. She denies frequency, urgency, dysuria, hematuria, and flank pain.   Review of Systems As per the history of present illness and are otherwise negative.   Past Medical History  Diagnosis Date  . Allergy   . Migraines   . Headache(784.0)   . Cancer     squamous cell skin cancer-shoulder  . Hypothyroidism 07/2012  . Anemia 07/2012  . Scoliosis   . DDD (degenerative disc disease)    Past Surgical History  Procedure Laterality Date  . Breast surgery  march 2011    breast bx-B-9, fibrocyctic changes  . Achilles tendon surgery  2001  . Abdominal surgery  1995    inguinal hernia, double  . Vein injections      reports that she has never smoked. She has never used smokeless tobacco. She reports that she drinks about 3.5 ounces of alcohol per week. She reports that she does not use illicit drugs. family history includes Alcohol abuse in an  other family member; Breast cancer in an other family member; Breast cancer (age of onset: 7) in her mother; Cancer (age of onset: 41) in her maternal grandmother; Diabetes in her father and another family member; Heart disease in her paternal grandmother; Hypertension in her father; Stroke in her father, maternal grandfather, and paternal grandfather; Thyroid disease in her mother. Allergies  Allergen Reactions  . Penicillins Other (See Comments)    Headaches   . Sulfonamide Derivatives     PFS history reviewed with PT during encounter.    Objective:   Physical Exam  Nursing note and vitals reviewed. Constitutional: She is oriented to person, place, and time. She appears well-developed and well-nourished. No distress.  HENT:  Head: Normocephalic and atraumatic.  Right Ear: External ear normal.  Left Ear: External ear normal.  Nose: Nose normal.  Mouth/Throat: Oropharynx is clear and moist. No oropharyngeal exudate.  Bilateral tympanic membranes are slightly bulging, otherwise normal in appearance.  Bilateral frontal maxillary sinuses nontender to palpation.  Eyes: Conjunctivae and EOM are normal. Pupils are equal, round, and reactive to light.  Neck: Normal range of motion. Neck supple. No JVD present.  Cardiovascular: Normal rate, regular rhythm, normal heart sounds and intact distal pulses.  Exam reveals no gallop and no friction rub.   No murmur heard. Pulmonary/Chest: Effort normal and breath sounds normal. No stridor. No respiratory distress. She has no wheezes. She has no rales. She exhibits no tenderness.  Musculoskeletal: Normal range of motion.  Lymphadenopathy:  She has no cervical adenopathy.  Neurological: She is alert and oriented to person, place, and time.  Skin: Skin is warm and dry. No rash noted. She is not diaphoretic. No erythema. No pallor.  Psychiatric: She has a normal mood and affect. Her behavior is normal. Judgment and thought content normal.   Filed  Vitals:   10/12/13 1605  BP: 100/70  Pulse: 76  Temp: 98 F (36.7 C)  Resp: 18    Lab Results  Component Value Date   WBC 6.8 05/03/2013   HGB 12.3 05/03/2013   HCT 36.0 05/03/2013   PLT 247 05/03/2013   GLUCOSE 87 05/03/2013   CHOL 184 05/03/2013   TRIG 179* 05/03/2013   HDL 91 05/03/2013   LDLCALC 57 05/03/2013   ALT 13 05/03/2013   AST 17 05/03/2013   NA 136 05/03/2013   K 4.0 05/03/2013   CL 103 05/03/2013   CREATININE 0.79 05/03/2013   BUN 16 05/03/2013   CO2 25 05/03/2013   TSH 2.671 05/03/2013      Assessment & Plan:  Dorothy Lynch was seen today for follow-up and vaginitis.  Diagnoses and associated orders for this visit:  Follow up - lamoTRIgine (LAMICTAL) 100 MG tablet; TAKE 1 TABLET BY MOUTH TWICE A DAY -  Tolerating medication well. Never had a rash while taking medication. - fexofenadine (ALLEGRA) 180 MG tablet; TAKE ONE TABLET BY MOUTH EVERY DAY  Yeast infection - fluconazole (DIFLUCAN) 150 MG tablet; Take 1 tablet (150 mg total) by mouth once. - Pt may repeat therapy every 72 hours as symptoms persist, up to 3 total doses.  ETD (eustachian tube dysfunction) - fexofenadine (ALLEGRA) 180 MG tablet; TAKE ONE TABLET BY MOUTH EVERY DAY - Also trial of nasal steroid spray per AVS.   Plan per AVS. Follow up as needed.  Patient Instructions  All of your medications should have refills now.  Fluconazole one pill every 72 hours up to 3 pills total for yeast infection as long as symptoms persist.  Force NON dairy fluids, drinking plenty of water is best.    Over the Counter Flonase OR Nasacort AQ 1 spray in each nostril twice a day as needed. Use the "crossover" technique into opposite nostril spraying toward opposite ear @ 45 degree angle, not straight up into nostril.   Plain Over the Counter Allegra (NOT D )  160 daily , OR Loratidine 10 mg , OR Zyrtec 10 mg @ bedtime  as needed for itchy eyes & sneezing.  Saline Irrigation and Saline Sprays can also help reduce  symptoms.  Followup to clinic as needed.

## 2013-10-16 ENCOUNTER — Other Ambulatory Visit: Payer: Self-pay | Admitting: Physician Assistant

## 2013-10-16 NOTE — Telephone Encounter (Signed)
Called the pharmacy and spoke with [facility]. One prescription was picked up on the 15th and two were picked up today.  Pt states she thought she was getting 10 refills. Pls advise.

## 2013-10-16 NOTE — Telephone Encounter (Signed)
Left a message for return call.  

## 2013-10-16 NOTE — Telephone Encounter (Signed)
No, Pt was only suppose to use a max of 3 doses at a rate of 1 pill every 72 hours as needed for persistent symptoms. She should not be taking this more often. We will provide no additional refills. Contact pt and provide reassurance that she only should have needed a total of 3 maximum doses for this episode.

## 2013-10-17 ENCOUNTER — Other Ambulatory Visit: Payer: Self-pay | Admitting: Gynecology

## 2013-10-17 NOTE — Telephone Encounter (Signed)
Called and spoke with pt and pt is aware of recommendations.  Pt states she remembered that she needed to take the medication every 72 hours.  Pt has one pill left and will take that one.  Advised pt that if she continues to have symptoms she may need to be seen by gynecology.  Pt is aware.

## 2013-10-19 NOTE — Telephone Encounter (Signed)
Called into pharmacy

## 2013-10-23 ENCOUNTER — Other Ambulatory Visit: Payer: Self-pay | Admitting: Gynecology

## 2013-10-23 NOTE — Telephone Encounter (Signed)
We had talked about her antidepressant/anxiety medications prescribed by Dr.Swords and he was to manage this. She is in the process of setting up an appointment to see another physician and I will refill her on a short-term basis but she needs to have this addressed by her new internist to include her Lamictal use.

## 2013-11-01 ENCOUNTER — Encounter: Payer: Self-pay | Admitting: Women's Health

## 2013-11-01 ENCOUNTER — Ambulatory Visit (INDEPENDENT_AMBULATORY_CARE_PROVIDER_SITE_OTHER): Payer: 59 | Admitting: Women's Health

## 2013-11-01 DIAGNOSIS — B373 Candidiasis of vulva and vagina: Secondary | ICD-10-CM

## 2013-11-01 DIAGNOSIS — B3731 Acute candidiasis of vulva and vagina: Secondary | ICD-10-CM

## 2013-11-01 LAB — WET PREP FOR TRICH, YEAST, CLUE
CLUE CELLS WET PREP: NONE SEEN
Trich, Wet Prep: NONE SEEN

## 2013-11-01 MED ORDER — ESTRADIOL 1 MG PO TABS: 3.0000 mg | ORAL_TABLET | Freq: Every day | ORAL | Status: DC

## 2013-11-01 MED ORDER — TERCONAZOLE 0.4 % VA CREA: 1.0000 | TOPICAL_CREAM | Freq: Every day | VAGINAL | Status: DC

## 2013-11-01 NOTE — Patient Instructions (Signed)
Monilial Vaginitis  Vaginitis in a soreness, swelling and redness (inflammation) of the vagina and vulva. Monilial vaginitis is not a sexually transmitted infection.  CAUSES   Yeast vaginitis is caused by yeast (candida) that is normally found in your vagina. With a yeast infection, the candida has overgrown in number to a point that upsets the chemical balance.  SYMPTOMS   · White, thick vaginal discharge.  · Swelling, itching, redness and irritation of the vagina and possibly the lips of the vagina (vulva).  · Burning or painful urination.  · Painful intercourse.  DIAGNOSIS   Things that may contribute to monilial vaginitis are:  · Postmenopausal and virginal states.  · Pregnancy.  · Infections.  · Being tired, sick or stressed, especially if you had monilial vaginitis in the past.  · Diabetes. Good control will help lower the chance.  · Birth control pills.  · Tight fitting garments.  · Using bubble bath, feminine sprays, douches or deodorant tampons.  · Taking certain medications that kill germs (antibiotics).  · Sporadic recurrence can occur if you become ill.  TREATMENT   Your caregiver will give you medication.  · There are several kinds of anti monilial vaginal creams and suppositories specific for monilial vaginitis. For recurrent yeast infections, use a suppository or cream in the vagina 2 times a week, or as directed.  · Anti-monilial or steroid cream for the itching or irritation of the vulva may also be used. Get your caregiver's permission.  · Painting the vagina with methylene blue solution may help if the monilial cream does not work.  · Eating yogurt may help prevent monilial vaginitis.  HOME CARE INSTRUCTIONS   · Finish all medication as prescribed.  · Do not have sex until treatment is completed or after your caregiver tells you it is okay.  · Take warm sitz baths.  · Do not douche.  · Do not use tampons, especially scented ones.  · Wear cotton underwear.  · Avoid tight pants and panty  hose.  · Tell your sexual partner that you have a yeast infection. They should go to their caregiver if they have symptoms such as mild rash or itching.  · Your sexual partner should be treated as well if your infection is difficult to eliminate.  · Practice safer sex. Use condoms.  · Some vaginal medications cause latex condoms to fail. Vaginal medications that harm condoms are:  · Cleocin cream.  · Butoconazole (Femstat®).  · Terconazole (Terazol®) vaginal suppository.  · Miconazole (Monistat®) (may be purchased over the counter).  SEEK MEDICAL CARE IF:   · You have a temperature by mouth above 102° F (38.9° C).  · The infection is getting worse after 2 days of treatment.  · The infection is not getting better after 3 days of treatment.  · You develop blisters in or around your vagina.  · You develop vaginal bleeding, and it is not your menstrual period.  · You have pain when you urinate.  · You develop intestinal problems.  · You have pain with sexual intercourse.  Document Released: 02/24/2005 Document Revised: 08/09/2011 Document Reviewed: 11/08/2008  ExitCare® Patient Information ©2014 [employer].

## 2013-11-01 NOTE — Addendum Note (Signed)
Addended by: Alen Blew on: 11/01/2013 02:31 PM   Modules accepted: Orders

## 2013-11-01 NOTE — Progress Notes (Signed)
Patient ID: Dorothy Lynch, female   DOB: [birth year], 53 y.o.   MRN: [id number]  Complains of vaginal itching x 2 months. Was seen at PCP on 5/51/15 and given 3 diflucan, instructed to go to GYN if symptoms do not improve. Notes yeast infections that are typically difficult to clear. Denies any urinary symptoms. History of hypothyroidism. Postmenopausal / on HRT/no bleeding.  Exam: Appears well. External genatalia: normal. Speculum: inflamed vagina with thick white discharge along vaginal walls and at cervix Wet Prep: few yeast, moderate bacteria, many WBC  Yeast vaginitis  1. Terconazole 0.4% vaginal cream qhs for 7, yeast prevention discussed. Instructed to call if no relief symptoms.  2. Postmenopausal: continue with HRT, vaginal lubricants encouraged.

## 2013-11-26 ENCOUNTER — Other Ambulatory Visit: Payer: Self-pay | Admitting: Physician Assistant

## 2013-12-03 ENCOUNTER — Encounter: Payer: Self-pay | Admitting: Family Medicine

## 2013-12-03 ENCOUNTER — Ambulatory Visit (INDEPENDENT_AMBULATORY_CARE_PROVIDER_SITE_OTHER): Payer: 59 | Admitting: Family Medicine

## 2013-12-03 VITALS — BP 100/60 | HR 62 | Temp 98.4°F | Ht 69.0 in | Wt 155.0 lb

## 2013-12-03 DIAGNOSIS — R0989 Other specified symptoms and signs involving the circulatory and respiratory systems: Secondary | ICD-10-CM

## 2013-12-03 LAB — POCT RAPID STREP A (OFFICE): RAPID STREP A SCREEN: NEGATIVE

## 2013-12-03 NOTE — Progress Notes (Signed)
No chief complaint on file.   HPI:  Acute visit for:  1) Feeling tired, PND, sick: -NOTE: PCP Dr. Leanne Chang, hx mood disorder, anemia, hypothyroid,  hot flashes, insomnia, anxiety on venlafaxine, ambien, lamictal, lexapro -reports: started a few days ago, tired, ha, feeling sick and daughter has strep, nausea, diarrhea - diarrhea is resolving -denies: fevers, SOB, sinus pain, tooth pain, ear pain -last tsh 7 months ago  ROS: See pertinent positives and negatives per HPI.  Past Medical History  Diagnosis Date  . Allergy   . Migraines   . Headache(784.0)   . Cancer     squamous cell skin cancer-shoulder  . Hypothyroidism 07/2012  . Anemia 07/2012  . Scoliosis   . DDD (degenerative disc disease)     Past Surgical History  Procedure Laterality Date  . Breast surgery  march 2011    breast bx-B-9, fibrocyctic changes  . Achilles tendon surgery  2001  . Abdominal surgery  1995    inguinal hernia, double  . Vein injections      Family History  Problem Relation Age of Onset  . Alcohol abuse    . Breast cancer    . Diabetes    . Diabetes Father   . Hypertension Father   . Stroke Father   . Cancer Maternal Grandmother 53    lung  . Stroke Maternal Grandfather   . Heart disease Paternal Grandmother     mi  . Stroke Paternal Grandfather   . Thyroid disease Mother   . Breast cancer Mother 69    History   Social History  . Marital Status: Married    Spouse Name: N/A    Number of Children: N/A  . Years of Education: N/A   Social History Main Topics  . Smoking status: Never Smoker   . Smokeless tobacco: Never Used  . Alcohol Use: 3.5 oz/week    7 drink(s) per week  . Drug Use: No  . Sexual Activity: Yes    Partners: Female    Birth Control/ Protection: Post-menopausal   Other Topics Concern  . None   Social History Narrative   Married   Regular exercise- yes   Caffeine use: coffee daily    Current outpatient prescriptions:BIOTIN PO, Take by mouth., Disp:  , Rfl: ;  escitalopram (LEXAPRO) 20 MG tablet, Take 0.5 tablets (10 mg total) by mouth daily., Disp: 90 tablet, Rfl: 1;  estradiol (ESTRACE) 1 MG tablet, Take 3 tablets (3 mg total) by mouth daily. TAKE 3 TABLETS EVERY EVENING, Disp: 90 tablet, Rfl: 6;  fexofenadine (ALLEGRA) 180 MG tablet, TAKE ONE TABLET BY MOUTH EVERY DAY, Disp: 30 tablet, Rfl: 11 lamoTRIgine (LAMICTAL) 100 MG tablet, TAKE 1 TABLET BY MOUTH TWICE A DAY, Disp: 60 tablet, Rfl: 5;  levothyroxine (SYNTHROID, LEVOTHROID) 50 MCG tablet, TAKE ONE TABLET BY MOUTH EVERY DAY BEFORE BREAKFAST, Disp: 90 tablet, Rfl: 4;  Omega-3 Fatty Acids (FISH OIL PO), Take by mouth., Disp: , Rfl: ;  progesterone (PROMETRIUM) 100 MG capsule, Take 1 capsule (100 mg total) by mouth at bedtime., Disp: 90 capsule, Rfl: 4 tretinoin (RETIN-A) 0.05 % cream, Apply topically at bedtime., Disp: 45 g, Rfl: 1;  venlafaxine XR (EFFEXOR-XR) 37.5 MG 24 hr capsule, TAKE 1 CAPSULE EVERY DAY, Disp: 90 capsule, Rfl: 1;  zolpidem (AMBIEN) 10 MG tablet, TAKE ONE TABLET BY MOUTH AT BEDTIME AS NEEDED FOR SLEEP, Disp: 90 tablet, Rfl: 2;  [DISCONTINUED] fluticasone (VERAMYST) 27.5 MCG/SPRAY nasal spray, Place 2 sprays into the nose  daily.  , Disp: , Rfl:   EXAM:  Filed Vitals:   12/03/13 1416  BP: 100/60  Pulse: 62  Temp: 98.4 F (36.9 C)    Body mass index is 22.88 kg/(m^2).  GENERAL: vitals reviewed and listed above, alert, oriented, appears well hydrated and in no acute distress  HEENT: atraumatic, conjunttiva clear, no obvious abnormalities on inspection of external nose and ears, normal appearance of ear canals and TMs, clear nasal congestion, mild post oropharyngeal erythema with PND, no tonsillar edema or exudate, no sinus TTP  NECK: no obvious masses on inspection  LUNGS: clear to auscultation bilaterally, no wheezes, rales or rhonchi, good air movement  CV: HRRR, no peripheral edema  MS: moves all extremities without noticeable abnormality  PSYCH: pleasant and  cooperative, no obvious depression or anxiety  ASSESSMENT AND PLAN:  Discussed the following assessment and plan:  Upper respiratory symptom  -likely viral and she does not like to take abx - checking strep culture due to possible exposure thought hx and exam do not suggest this -Patient advised to return or notify a doctor immediately if symptoms worsen or persist or new concerns arise.  There are no Patient Instructions on file for this visit.   Dorothy Lynch R.

## 2013-12-03 NOTE — Progress Notes (Signed)
Pre visit review using our clinic review tool, if applicable. No additional management support is needed unless otherwise documented below in the visit note. 

## 2013-12-03 NOTE — Addendum Note (Signed)
Addended by: Agnes Lawrence on: 12/03/2013 02:53 PM   Modules accepted: Orders

## 2013-12-05 LAB — CULTURE, GROUP A STREP: Organism ID, Bacteria: NORMAL

## 2013-12-11 ENCOUNTER — Other Ambulatory Visit: Payer: Self-pay

## 2013-12-14 ENCOUNTER — Telehealth: Payer: Self-pay | Admitting: Internal Medicine

## 2013-12-14 ENCOUNTER — Other Ambulatory Visit: Payer: Self-pay | Admitting: Gynecology

## 2013-12-14 NOTE — Telephone Encounter (Signed)
Pt is needing new rx escitalopram (LEXAPRO) 20 [facility]

## 2013-12-17 NOTE — Telephone Encounter (Signed)
Pt has not seen Dr Leanne Chang since 2012.  She needs to establish with another provider.  Dr Yong Channel is okay if she does not have a preference

## 2013-12-17 NOTE — Telephone Encounter (Signed)
Left msg for pt to call back

## 2013-12-18 ENCOUNTER — Other Ambulatory Visit: Payer: Self-pay | Admitting: *Deleted

## 2013-12-18 MED ORDER — ESCITALOPRAM OXALATE 20 MG PO TABS: 10.0000 mg | ORAL_TABLET | Freq: Every day | ORAL | Status: DC

## 2013-12-26 ENCOUNTER — Other Ambulatory Visit: Payer: Self-pay | Admitting: Internal Medicine

## 2014-03-06 ENCOUNTER — Ambulatory Visit: Payer: 59 | Admitting: *Deleted

## 2014-03-14 ENCOUNTER — Other Ambulatory Visit: Payer: Self-pay | Admitting: Internal Medicine

## 2014-03-14 ENCOUNTER — Other Ambulatory Visit: Payer: Self-pay | Admitting: Gynecology

## 2014-03-15 ENCOUNTER — Other Ambulatory Visit: Payer: Self-pay | Admitting: Internal Medicine

## 2014-04-01 ENCOUNTER — Encounter: Payer: Self-pay | Admitting: Family Medicine

## 2014-04-23 ENCOUNTER — Other Ambulatory Visit: Payer: Self-pay | Admitting: Gynecology

## 2014-04-23 NOTE — Telephone Encounter (Signed)
Called into pharmacy

## 2014-05-17 ENCOUNTER — Other Ambulatory Visit: Payer: Self-pay

## 2014-05-17 ENCOUNTER — Other Ambulatory Visit: Payer: Self-pay | Admitting: Gynecology

## 2014-05-17 MED ORDER — ESTRADIOL 1 MG PO TABS: 3.0000 mg | ORAL_TABLET | Freq: Every day | ORAL | Status: DC

## 2014-05-20 ENCOUNTER — Other Ambulatory Visit: Payer: Self-pay | Admitting: Family Medicine

## 2014-05-20 MED ORDER — LAMOTRIGINE 100 MG PO TABS: 100.0000 mg | ORAL_TABLET | Freq: Two times a day (BID) | ORAL | Status: DC

## 2014-05-20 NOTE — Telephone Encounter (Signed)
Last filled by Kela Millin, PAC on 11/26/13 for 6 months.

## 2014-06-17 ENCOUNTER — Other Ambulatory Visit: Payer: Self-pay | Admitting: Gynecology

## 2014-06-17 ENCOUNTER — Other Ambulatory Visit: Payer: Self-pay | Admitting: Internal Medicine

## 2014-07-25 ENCOUNTER — Other Ambulatory Visit: Payer: Self-pay | Admitting: Gynecology

## 2014-07-25 NOTE — Telephone Encounter (Signed)
Annual exam scheduled for March 8

## 2014-08-02 ENCOUNTER — Ambulatory Visit: Payer: PRIVATE HEALTH INSURANCE | Admitting: Family Medicine

## 2014-08-05 ENCOUNTER — Encounter: Payer: Self-pay | Admitting: Gynecology

## 2014-08-06 ENCOUNTER — Encounter: Payer: Self-pay | Admitting: Gynecology

## 2014-08-09 ENCOUNTER — Other Ambulatory Visit: Payer: Self-pay

## 2014-08-09 MED ORDER — ESTRADIOL 1 MG PO TABS: 3.0000 mg | ORAL_TABLET | Freq: Every day | ORAL | Status: DC

## 2014-08-09 NOTE — Telephone Encounter (Signed)
Patient's last CE 05/03/13. She has CE scheduled with you for 09/12/14 (rescheduled from 08/06/14).

## 2014-08-21 ENCOUNTER — Other Ambulatory Visit: Payer: Self-pay | Admitting: Gynecology

## 2014-08-30 ENCOUNTER — Other Ambulatory Visit: Payer: Self-pay | Admitting: Dermatology

## 2014-09-12 ENCOUNTER — Other Ambulatory Visit: Payer: Self-pay | Admitting: Internal Medicine

## 2014-09-12 ENCOUNTER — Ambulatory Visit (INDEPENDENT_AMBULATORY_CARE_PROVIDER_SITE_OTHER): Payer: PRIVATE HEALTH INSURANCE | Admitting: Gynecology

## 2014-09-12 ENCOUNTER — Encounter: Payer: Self-pay | Admitting: Gynecology

## 2014-09-12 ENCOUNTER — Other Ambulatory Visit (HOSPITAL_COMMUNITY)
Admission: RE | Admit: 2014-09-12 | Discharge: 2014-09-12 | Disposition: A | Discharge status: Home / Self Care | Payer: PRIVATE HEALTH INSURANCE | Source: Ambulatory Visit | Attending: Gynecology | Admitting: Gynecology

## 2014-09-12 VITALS — BP 120/76 | Ht 69.0 in | Wt 158.0 lb

## 2014-09-12 DIAGNOSIS — N952 Postmenopausal atrophic vaginitis: Secondary | ICD-10-CM | POA: Diagnosis not present

## 2014-09-12 DIAGNOSIS — N898 Other specified noninflammatory disorders of vagina: Secondary | ICD-10-CM

## 2014-09-12 DIAGNOSIS — Z79890 Hormone replacement therapy (postmenopausal): Secondary | ICD-10-CM

## 2014-09-12 DIAGNOSIS — Z01419 Encounter for gynecological examination (general) (routine) without abnormal findings: Secondary | ICD-10-CM | POA: Insufficient documentation

## 2014-09-12 DIAGNOSIS — N951 Menopausal and female climacteric states: Secondary | ICD-10-CM

## 2014-09-12 LAB — COMPREHENSIVE METABOLIC PANEL
ALBUMIN: 3.7 g/dL (ref 3.5–5.2)
ALT: 12 U/L (ref 0–35)
AST: 15 U/L (ref 0–37)
Alkaline Phosphatase: 57 U/L (ref 39–117)
BUN: 13 mg/dL (ref 6–23)
CALCIUM: 8.9 mg/dL (ref 8.4–10.5)
CHLORIDE: 101 meq/L (ref 96–112)
CO2: 27 mEq/L (ref 19–32)
CREATININE: 0.75 mg/dL (ref 0.50–1.10)
Glucose, Bld: 85 mg/dL (ref 70–99)
POTASSIUM: 4.1 meq/L (ref 3.5–5.3)
SODIUM: 136 meq/L (ref 135–145)
TOTAL PROTEIN: 6.3 g/dL (ref 6.0–8.3)
Total Bilirubin: 0.3 mg/dL (ref 0.2–1.2)

## 2014-09-12 LAB — LIPID PANEL
Cholesterol: 184 mg/dL (ref 0–200)
HDL: 97 mg/dL (ref >=46)
LDL CALC: 55 mg/dL (ref 0–99)
Total CHOL/HDL Ratio: 1.9 Ratio
Triglycerides: 159 mg/dL — ABNORMAL HIGH (ref <150)
VLDL: 32 mg/dL (ref 0–40)

## 2014-09-12 LAB — WET PREP FOR TRICH, YEAST, CLUE
Clue Cells Wet Prep HPF POC: NONE SEEN
Trich, Wet Prep: NONE SEEN
YEAST WET PREP: NONE SEEN

## 2014-09-12 LAB — TSH: TSH: 4.314 u[IU]/mL (ref 0.350–4.500)

## 2014-09-12 MED ORDER — PROGESTERONE MICRONIZED 100 MG PO CAPS: 100.0000 mg | ORAL_CAPSULE | Freq: Every day | ORAL | Status: DC

## 2014-09-12 MED ORDER — ZOLPIDEM TARTRATE 10 MG PO TABS: 10.0000 mg | ORAL_TABLET | Freq: Every evening | ORAL | Status: DC | PRN

## 2014-09-12 MED ORDER — ESCITALOPRAM OXALATE 10 MG PO TABS: 10.0000 mg | ORAL_TABLET | Freq: Every day | ORAL | Status: DC

## 2014-09-12 MED ORDER — VENLAFAXINE HCL ER 37.5 MG PO CP24: ORAL_CAPSULE | ORAL | Status: DC

## 2014-09-12 MED ORDER — LEVOTHYROXINE SODIUM 50 MCG PO TABS: ORAL_TABLET | ORAL | Status: DC

## 2014-09-12 MED ORDER — ESTRADIOL 1 MG PO TABS: 3.0000 mg | ORAL_TABLET | Freq: Every evening | ORAL | Status: DC

## 2014-09-12 NOTE — Progress Notes (Signed)
Bebe Moncure [birth year] [id number]        54 y.o.  [id number] for annual exam.  Several issues noted below.  Past medical history,surgical history, problem list, medications, allergies, family history and social history were all reviewed and documented as reviewed in the EPIC chart.  ROS:  Performed with pertinent positives and negatives included in the history, assessment and plan.   Additional significant findings :  none   Exam: Kim Counsellor Vitals:   09/12/14 1611  BP: 120/76  Height: 5\' 9"  (1.753 m)  Weight: 158 lb (71.668 kg)   General appearance:  Normal affect, orientation and appearance. Skin: Grossly normal HEENT: Without gross lesions.  No cervical or supraclavicular adenopathy. Thyroid normal.  Lungs:  Clear without wheezing, rales or rhonchi Cardiac: RR, without RMG Abdominal:  Soft, nontender, without masses, guarding, rebound, organomegaly or hernia Breasts:  Examined lying and sitting without masses, retractions, discharge or axillary adenopathy. Pelvic:  Ext/BUS/vagina normal with mild atrophic changes.  Cervix normal. Pap smear done  Uterus anteverted, normal size, shape and contour, midline and mobile nontender   Adnexa  Without masses or tenderness    Anus and perineum  Normal   Rectovaginal  Normal sphincter tone without palpated masses or tenderness.    Assessment/Plan:  54 y.o. [id number] female for annual exam.   1. Postmenopausal/HRT. Patient continues on 3 mg estradiol daily one in the morning and 2 at bedtime. This seems to control her hot flushes. Also Prometrium 100 mg nightly. We'll check estradiol level to sure she is absorbing. I again reviewed the whole issue of HRT to include the WHI study with increased risk of stroke heart attack DVT and breast cancer. The ACOG and NAMS statements for lowest dose for shortest period of time also reviewed. At this point the patient wants to continue and I refilled her 1 year. She's done no bleeding and she knows  the importance of reporting any bleeding. 2. Reports some recurrent yeast infections. I checked her today and wet prep was negative. She does have some Diflucan at home that she uses intermittently and will call if she needs more. 3. Pap smear 2012. Pap smear done today. No history of significant abnormal Pap smears previously. 4. Hypothyroid.  On Synthroid 50 g daily. Check TSH today. Refill 1 year. 5. Anxiety/depression. Patient on Effexor XR 37.5 mg and Lexapro 10 mg daily. Had initially been started by Dr. Leanne Chang. He is no longer in clinical practice and the patient feels that this is the right mix for her and she is doing well with this I refilled her for both 1 year. She is in the process of trying to find a primary physician. 6. Ambien 10 mg. Patient uses for sleep and does well with this. #90 with 2 refills provided. 7. Colonoscopy 2011. Repeat at their recommended interval.  8. Mammography coming due in May and I reminded her to schedule this. SBE monthly reviewed. 9. DEXA never. Will plan further into the menopause as she is on HRT. Check vitamin D level today. 10. Health maintenance.  CBC comprehensive metabolic panel lipid profile urinalysis TSH vitamin D estradiol level ordered.  Follow up in one year, sooner as needed.     Anastasio Auerbach MD, 5:12 PM 09/12/2014

## 2014-09-12 NOTE — Patient Instructions (Signed)
You may obtain a copy of any labs that were done today by logging onto MyChart as outlined in the instructions provided with your AVS (after visit summary). The office will not call with normal lab results but certainly if there are any significant abnormalities then we will contact you.   Health Maintenance, Female A healthy lifestyle and preventative care can promote health and wellness.  Maintain regular health, dental, and eye exams.  Eat a healthy diet. Foods like vegetables, fruits, whole grains, low-fat dairy products, and lean protein foods contain the nutrients you need without too many calories. Decrease your intake of foods high in solid fats, added sugars, and salt. Get information about a proper diet from your caregiver, if necessary.  Regular physical exercise is one of the most important things you can do for your health. Most adults should get at least 150 minutes of moderate-intensity exercise (any activity that increases your heart rate and causes you to sweat) each week. In addition, most adults need muscle-strengthening exercises on 2 or more days a week.   Maintain a healthy weight. The body mass index (BMI) is a screening tool to identify possible weight problems. It provides an estimate of body fat based on height and weight. Your caregiver can help determine your BMI, and can help you achieve or maintain a healthy weight. For adults 20 years and older:  A BMI below 18.5 is considered underweight.  A BMI of 18.5 to 24.9 is normal.  A BMI of 25 to 29.9 is considered overweight.  A BMI of 30 and above is considered obese.  Maintain normal blood lipids and cholesterol by exercising and minimizing your intake of saturated fat. Eat a balanced diet with plenty of fruits and vegetables. Blood tests for lipids and cholesterol should begin at age 61 and be repeated every 5 years. If your lipid or cholesterol levels are high, you are over 50, or you are a high risk for heart  disease, you may need your cholesterol levels checked more frequently.Ongoing high lipid and cholesterol levels should be treated with medicines if diet and exercise are not effective.  If you smoke, find out from your caregiver how to quit. If you do not use tobacco, do not start.  Lung cancer screening is recommended for adults aged 33 80 years who are at high risk for developing lung cancer because of a history of smoking. Yearly low-dose computed tomography (CT) is recommended for people who have at least a 30-pack-year history of smoking and are a current smoker or have quit within the past 15 years. A pack year of smoking is smoking an average of 1 pack of cigarettes a day for 1 year (for example: 1 pack a day for 30 years or 2 packs a day for 15 years). Yearly screening should continue until the smoker has stopped smoking for at least 15 years. Yearly screening should also be stopped for people who develop a health problem that would prevent them from having lung cancer treatment.  If you are pregnant, do not drink alcohol. If you are breastfeeding, be very cautious about drinking alcohol. If you are not pregnant and choose to drink alcohol, do not exceed 1 drink per day. One drink is considered to be 12 ounces (355 mL) of beer, 5 ounces (148 mL) of wine, or 1.5 ounces (44 mL) of liquor.  Avoid use of street drugs. Do not share needles with anyone. Ask for help if you need support or instructions about stopping  the use of drugs.  High blood pressure causes heart disease and increases the risk of stroke. Blood pressure should be checked at least every 1 to 2 years. Ongoing high blood pressure should be treated with medicines, if weight loss and exercise are not effective.  If you are 64 to 55 years old, ask your caregiver if you should take aspirin to prevent strokes.  Diabetes screening involves taking a blood sample to check your fasting blood sugar level. This should be done once every 3  years, after age 83, if you are within normal weight and without risk factors for diabetes. Testing should be considered at a younger age or be carried out more frequently if you are overweight and have at least 1 risk factor for diabetes.  Breast cancer screening is essential preventative care for women. You should practice "breast self-awareness." This means understanding the normal appearance and feel of your breasts and may include breast self-examination. Any changes detected, no matter how small, should be reported to a caregiver. Women in their 17s and 30s should have a clinical breast exam (CBE) by a caregiver as part of a regular health exam every 1 to 3 years. After age 75, women should have a CBE every year. Starting at age 38, women should consider having a mammogram (breast X-ray) every year. Women who have a family history of breast cancer should talk to their caregiver about genetic screening. Women at a high risk of breast cancer should talk to their caregiver about having an MRI and a mammogram every year.  Breast cancer gene (BRCA)-related cancer risk assessment is recommended for women who have family members with BRCA-related cancers. BRCA-related cancers include breast, ovarian, tubal, and peritoneal cancers. Having family members with these cancers may be associated with an increased risk for harmful changes (mutations) in the breast cancer genes BRCA1 and BRCA2. Results of the assessment will determine the need for genetic counseling and BRCA1 and BRCA2 testing.  The Pap test is a screening test for cervical cancer. Women should have a Pap test starting at age 53. Between ages 54 and 38, Pap tests should be repeated every 2 years. Beginning at age 40, you should have a Pap test every 3 years as long as the past 3 Pap tests have been normal. If you had a hysterectomy for a problem that was not cancer or a condition that could lead to cancer, then you no longer need Pap tests. If you are  between ages 56 and 8, and you have had normal Pap tests going back 10 years, you no longer need Pap tests. If you have had past treatment for cervical cancer or a condition that could lead to cancer, you need Pap tests and screening for cancer for at least 20 years after your treatment. If Pap tests have been discontinued, risk factors (such as a new sexual partner) need to be reassessed to determine if screening should be resumed. Some women have medical problems that increase the chance of getting cervical cancer. In these cases, your caregiver may recommend more frequent screening and Pap tests.  The human papillomavirus (HPV) test is an additional test that may be used for cervical cancer screening. The HPV test looks for the virus that can cause the cell changes on the cervix. The cells collected during the Pap test can be tested for HPV. The HPV test could be used to screen women aged 66 years and older, and should be used in women of any age  who have unclear Pap test results. After the age of 65, women should have HPV testing at the same frequency as a Pap test.  Colorectal cancer can be detected and often prevented. Most routine colorectal cancer screening begins at the age of 63 and continues through age 25. However, your caregiver may recommend screening at an earlier age if you have risk factors for colon cancer. On a yearly basis, your caregiver may provide home test kits to check for hidden blood in the stool. Use of a small camera at the end of a tube, to directly examine the colon (sigmoidoscopy or colonoscopy), can detect the earliest forms of colorectal cancer. Talk to your caregiver about this at age 5, when routine screening begins. Direct examination of the colon should be repeated every 5 to 10 years through age 43, unless early forms of pre-cancerous polyps or small growths are found.  Hepatitis C blood testing is recommended for all people born from [birth year] through 1966 and any  individual with known risks for hepatitis C.  Practice safe sex. Use condoms and avoid high-risk sexual practices to reduce the spread of sexually transmitted infections (STIs). Sexually active women aged 59 and younger should be checked for Chlamydia, which is a common sexually transmitted infection. Older women with new or multiple partners should also be tested for Chlamydia. Testing for other STIs is recommended if you are sexually active and at increased risk.  Osteoporosis is a disease in which the bones lose minerals and strength with aging. This can result in serious bone fractures. The risk of osteoporosis can be identified using a bone density scan. Women ages 47 and over and women at risk for fractures or osteoporosis should discuss screening with their caregivers. Ask your caregiver whether you should be taking a calcium supplement or vitamin D to reduce the rate of osteoporosis.  Menopause can be associated with physical symptoms and risks. Hormone replacement therapy is available to decrease symptoms and risks. You should talk to your caregiver about whether hormone replacement therapy is right for you.  Use sunscreen. Apply sunscreen liberally and repeatedly throughout the day. You should seek shade when your shadow is shorter than you. Protect yourself by wearing long sleeves, pants, a wide-brimmed hat, and sunglasses year round, whenever you are outdoors.  Notify your caregiver of new moles or changes in moles, especially if there is a change in shape or color. Also notify your caregiver if a mole is larger than the size of a pencil eraser.  Stay current with your immunizations. Document Released: 11/30/2010 Document Revised: 09/11/2012 Document Reviewed: 11/30/2010 [facility] Patient Information 2014 [place].

## 2014-09-13 ENCOUNTER — Other Ambulatory Visit: Payer: Self-pay | Admitting: Gynecology

## 2014-09-13 DIAGNOSIS — R7989 Other specified abnormal findings of blood chemistry: Secondary | ICD-10-CM

## 2014-09-13 LAB — CBC WITH DIFFERENTIAL/PLATELET
BASOS ABS: 0 10*3/uL (ref 0.0–0.1)
BASOS PCT: 0 % (ref 0–1)
EOS ABS: 0.2 10*3/uL (ref 0.0–0.7)
EOS PCT: 3 % (ref 0–5)
HCT: 36 % (ref 36.0–46.0)
Hemoglobin: 12 g/dL (ref 12.0–15.0)
Lymphocytes Relative: 31 % (ref 12–46)
Lymphs Abs: 2.4 10*3/uL (ref 0.7–4.0)
MCH: 31.9 pg (ref 26.0–34.0)
MCHC: 33.3 g/dL (ref 30.0–36.0)
MCV: 95.7 fL (ref 78.0–100.0)
MONO ABS: 0.6 10*3/uL (ref 0.1–1.0)
MPV: 9.5 fL (ref 8.6–12.4)
Monocytes Relative: 8 % (ref 3–12)
Neutro Abs: 4.5 10*3/uL (ref 1.7–7.7)
Neutrophils Relative %: 58 % (ref 43–77)
PLATELETS: 234 10*3/uL (ref 150–400)
RBC: 3.76 MIL/uL — ABNORMAL LOW (ref 3.87–5.11)
RDW: 13.6 % (ref 11.5–15.5)
WBC: 7.7 10*3/uL (ref 4.0–10.5)

## 2014-09-13 LAB — URINALYSIS W MICROSCOPIC + REFLEX CULTURE
Bacteria, UA: NONE SEEN
Bilirubin Urine: NEGATIVE
Casts: NONE SEEN
Crystals: NONE SEEN
Glucose, UA: NEGATIVE mg/dL
HGB URINE DIPSTICK: NEGATIVE
KETONES UR: NEGATIVE mg/dL
Nitrite: NEGATIVE
PH: 6.5 (ref 5.0–8.0)
Protein, ur: NEGATIVE mg/dL
Specific Gravity, Urine: 1.009 (ref 1.005–1.030)
Squamous Epithelial / LPF: NONE SEEN
Urobilinogen, UA: 0.2 mg/dL (ref 0.0–1.0)

## 2014-09-13 LAB — ESTRADIOL: Estradiol: 102.5 pg/mL

## 2014-09-13 LAB — VITAMIN D 25 HYDROXY (VIT D DEFICIENCY, FRACTURES): VIT D 25 HYDROXY: 32 ng/mL (ref 30–100)

## 2014-09-13 MED ORDER — LEVOTHYROXINE SODIUM 75 MCG PO TABS: 75.0000 ug | ORAL_TABLET | Freq: Every day | ORAL | Status: DC

## 2014-09-14 LAB — URINE CULTURE

## 2014-09-16 LAB — CYTOLOGY - PAP

## 2014-10-10 ENCOUNTER — Other Ambulatory Visit: Payer: Self-pay | Admitting: Gynecology

## 2014-11-05 ENCOUNTER — Other Ambulatory Visit: Payer: Self-pay | Admitting: Gynecology

## 2014-11-06 ENCOUNTER — Other Ambulatory Visit: Payer: Self-pay | Admitting: *Deleted

## 2014-11-06 DIAGNOSIS — Z09 Encounter for follow-up examination after completed treatment for conditions other than malignant neoplasm: Secondary | ICD-10-CM

## 2014-11-06 MED ORDER — FEXOFENADINE HCL 180 MG PO TABS: ORAL_TABLET | ORAL | Status: DC

## 2014-11-13 ENCOUNTER — Encounter: Payer: Self-pay | Admitting: Gynecology

## 2014-11-15 ENCOUNTER — Encounter: Payer: Self-pay | Admitting: Gynecology

## 2014-11-19 ENCOUNTER — Other Ambulatory Visit: Payer: Self-pay | Admitting: Radiology

## 2014-11-21 ENCOUNTER — Encounter: Payer: Self-pay | Admitting: Gynecology

## 2014-11-22 ENCOUNTER — Inpatient Hospital Stay: Admit: 2014-11-22 | Payer: PRIVATE HEALTH INSURANCE | Admitting: Orthopedic Surgery

## 2014-11-22 ENCOUNTER — Encounter: Payer: Self-pay | Admitting: Gynecology

## 2014-11-22 SURGERY — ARTHROPLASTY, HIP, TOTAL, ANTERIOR APPROACH
Anesthesia: General | Site: Hip | Laterality: Left

## 2014-12-06 ENCOUNTER — Encounter: Payer: Self-pay | Admitting: Internal Medicine

## 2014-12-16 ENCOUNTER — Other Ambulatory Visit: Payer: Self-pay | Admitting: Gynecology

## 2014-12-30 ENCOUNTER — Other Ambulatory Visit: Payer: Self-pay | Admitting: Gynecology

## 2015-01-09 ENCOUNTER — Other Ambulatory Visit: Payer: Self-pay | Admitting: Orthopedic Surgery

## 2015-01-15 ENCOUNTER — Encounter (HOSPITAL_COMMUNITY)
Admission: RE | Admit: 2015-01-15 | Discharge: 2015-01-15 | Disposition: A | Discharge status: Home / Self Care | Payer: PRIVATE HEALTH INSURANCE | Source: Ambulatory Visit | Attending: Orthopedic Surgery | Admitting: Orthopedic Surgery

## 2015-01-15 ENCOUNTER — Encounter (HOSPITAL_COMMUNITY): Payer: Self-pay

## 2015-01-15 ENCOUNTER — Ambulatory Visit (HOSPITAL_COMMUNITY)
Admission: RE | Admit: 2015-01-15 | Discharge: 2015-01-15 | Disposition: A | Discharge status: Home / Self Care | Payer: PRIVATE HEALTH INSURANCE | Source: Ambulatory Visit | Attending: Orthopedic Surgery | Admitting: Orthopedic Surgery

## 2015-01-15 DIAGNOSIS — M419 Scoliosis, unspecified: Secondary | ICD-10-CM | POA: Diagnosis not present

## 2015-01-15 DIAGNOSIS — Z01818 Encounter for other preprocedural examination: Secondary | ICD-10-CM | POA: Diagnosis not present

## 2015-01-15 DIAGNOSIS — M167 Other unilateral secondary osteoarthritis of hip: Secondary | ICD-10-CM | POA: Diagnosis not present

## 2015-01-15 DIAGNOSIS — Z0183 Encounter for blood typing: Secondary | ICD-10-CM | POA: Diagnosis not present

## 2015-01-15 DIAGNOSIS — Z01812 Encounter for preprocedural laboratory examination: Secondary | ICD-10-CM | POA: Diagnosis not present

## 2015-01-15 DIAGNOSIS — R001 Bradycardia, unspecified: Secondary | ICD-10-CM | POA: Insufficient documentation

## 2015-01-15 HISTORY — DX: Major depressive disorder, single episode, unspecified: F32.9

## 2015-01-15 HISTORY — DX: Other specified postprocedural states: Z98.890

## 2015-01-15 HISTORY — DX: Depression, unspecified: F32.A

## 2015-01-15 HISTORY — DX: Anxiety disorder, unspecified: F41.9

## 2015-01-15 HISTORY — DX: Other specified postprocedural states: R11.2

## 2015-01-15 LAB — URINALYSIS, ROUTINE W REFLEX MICROSCOPIC
BILIRUBIN URINE: NEGATIVE
Glucose, UA: NEGATIVE mg/dL
Hgb urine dipstick: NEGATIVE
Ketones, ur: NEGATIVE mg/dL
Leukocytes, UA: NEGATIVE
NITRITE: NEGATIVE
PH: 6 (ref 5.0–8.0)
Protein, ur: NEGATIVE mg/dL
SPECIFIC GRAVITY, URINE: 1.012 (ref 1.005–1.030)
Urobilinogen, UA: 0.2 mg/dL (ref 0.0–1.0)

## 2015-01-15 LAB — COMPREHENSIVE METABOLIC PANEL
ALT: 15 U/L (ref 14–54)
ANION GAP: 7 (ref 5–15)
AST: 18 U/L (ref 15–41)
Albumin: 3.4 g/dL — ABNORMAL LOW (ref 3.5–5.0)
Alkaline Phosphatase: 59 U/L (ref 38–126)
BILIRUBIN TOTAL: 0.3 mg/dL (ref 0.3–1.2)
BUN: 12 mg/dL (ref 6–20)
CO2: 26 mmol/L (ref 22–32)
Calcium: 8.8 mg/dL — ABNORMAL LOW (ref 8.9–10.3)
Chloride: 103 mmol/L (ref 101–111)
Creatinine, Ser: 1.02 mg/dL — ABNORMAL HIGH (ref 0.44–1.00)
GFR calc Af Amer: >60 mL/min (ref >60)
Glucose, Bld: 113 mg/dL — ABNORMAL HIGH (ref 65–99)
Potassium: 4 mmol/L (ref 3.5–5.1)
Sodium: 136 mmol/L (ref 135–145)
Total Protein: 6.4 g/dL — ABNORMAL LOW (ref 6.5–8.1)

## 2015-01-15 LAB — PROTIME-INR
INR: 0.91 (ref 0.00–1.49)
Prothrombin Time: 12.5 seconds (ref 11.6–15.2)

## 2015-01-15 LAB — CBC WITH DIFFERENTIAL/PLATELET
Basophils Absolute: 0 10*3/uL (ref 0.0–0.1)
Basophils Relative: 1 % (ref 0–1)
Eosinophils Absolute: 0.2 10*3/uL (ref 0.0–0.7)
Eosinophils Relative: 2 % (ref 0–5)
HEMATOCRIT: 38.4 % (ref 36.0–46.0)
Hemoglobin: 12.7 g/dL (ref 12.0–15.0)
LYMPHS ABS: 2.3 10*3/uL (ref 0.7–4.0)
LYMPHS PCT: 32 % (ref 12–46)
MCH: 31.9 pg (ref 26.0–34.0)
MCHC: 33.1 g/dL (ref 30.0–36.0)
MCV: 96.5 fL (ref 78.0–100.0)
MONO ABS: 0.6 10*3/uL (ref 0.1–1.0)
MONOS PCT: 8 % (ref 3–12)
Neutro Abs: 4.3 10*3/uL (ref 1.7–7.7)
Neutrophils Relative %: 57 % (ref 43–77)
Platelets: 258 10*3/uL (ref 150–400)
RBC: 3.98 MIL/uL (ref 3.87–5.11)
RDW: 12.7 % (ref 11.5–15.5)
WBC: 7.4 10*3/uL (ref 4.0–10.5)

## 2015-01-15 LAB — APTT: aPTT: 30 seconds (ref 24–37)

## 2015-01-15 LAB — ABO/RH: ABO/RH(D): A POS

## 2015-01-15 LAB — TYPE AND SCREEN
ABO/RH(D): A POS
ANTIBODY SCREEN: NEGATIVE

## 2015-01-15 LAB — SURGICAL PCR SCREEN
MRSA, PCR: NEGATIVE
Staphylococcus aureus: NEGATIVE

## 2015-01-15 MED ORDER — CHLORHEXIDINE GLUCONATE 4 % EX LIQD: 60.0000 mL | Freq: Once | CUTANEOUS | Status: DC

## 2015-01-15 NOTE — Pre-Procedure Instructions (Signed)
    Marily Konczal  01/15/2015      [facility] [place] [facility] [place], [place] [address] [place] [address] Phone: [phone] Fax: [phone], [place] - [address] [place] [phone] [place] [address] Phone: [phone] Fax: [phone]    Your procedure is scheduled on Friday, August 26th, 2016  Report to [facility] Admitting at 5:30 A.M.  Call this number if you have problems the morning of surgery:  [phone]   Remember:  Do not eat food or drink liquids after midnight.   Take these medicines the morning of surgery with A SIP OF WATER: Escitalopram (Lexapro), Lamotrigine (Lamictal), Levothyroxine (Synthroid), Venlafaxine XR (Effecor-XR).  Stop taking: NSAIDS, Aspirin, Aleve, Naproxen, Ibuprofen, BC's, Goody's, Motrin, Advil, Fish Oil, all herbal medications and all vitamins.    Do not wear jewelry, make-up or nail polish.  Do not wear lotions, powders, or perfumes.  You may wear deodorant.  Do not shave 48 hours prior to surgery.    Do not bring valuables to the hospital.  [facility] is not responsible for any belongings or valuables.  Contacts, dentures or bridgework may not be worn into surgery.  Leave your suitcase in the car.  After surgery it may be brought to your room.  For patients admitted to the hospital, discharge time will be determined by your treatment team.  Patients discharged the day of surgery will not be allowed to drive home.   Special instructions:  See attached.   Please read over the following fact sheets that you were given. Pain Booklet, Coughing and Deep Breathing, Blood Transfusion Information, MRSA Information and Surgical Site Infection Prevention

## 2015-01-15 NOTE — Progress Notes (Signed)
PCP - Dr. Leanne Chang but patient states she sees her OB/GYN, Dr. Phineas Real most often. Cardiologist - denies  EKG- 01/15/15 - Epic CXR - 01/15/15 - Epic Echo/Stress Test/ Cardiac Cath - denies  Pt. Denies shortness of breath and chest pain at PAT appointment.

## 2015-01-18 ENCOUNTER — Other Ambulatory Visit: Payer: Self-pay | Admitting: Gynecology

## 2015-01-20 ENCOUNTER — Other Ambulatory Visit: Payer: Self-pay | Admitting: Gynecology

## 2015-01-20 ENCOUNTER — Telehealth: Payer: Self-pay | Admitting: *Deleted

## 2015-01-20 DIAGNOSIS — E039 Hypothyroidism, unspecified: Secondary | ICD-10-CM

## 2015-01-20 MED ORDER — LEVOTHYROXINE SODIUM 75 MCG PO TABS: ORAL_TABLET | ORAL | Status: DC

## 2015-01-20 NOTE — Telephone Encounter (Signed)
Pt called requesting refill on synthroid 75 mcg, coming Wednesday to have tsh level checked. Rx will be sent.

## 2015-01-22 ENCOUNTER — Ambulatory Visit (INDEPENDENT_AMBULATORY_CARE_PROVIDER_SITE_OTHER): Payer: PRIVATE HEALTH INSURANCE | Admitting: Women's Health

## 2015-01-22 ENCOUNTER — Encounter: Payer: Self-pay | Admitting: Women's Health

## 2015-01-22 ENCOUNTER — Ambulatory Visit: Payer: PRIVATE HEALTH INSURANCE | Admitting: Adult Health

## 2015-01-22 VITALS — BP 124/80 | Ht 68.0 in | Wt 158.0 lb

## 2015-01-22 DIAGNOSIS — E038 Other specified hypothyroidism: Secondary | ICD-10-CM | POA: Diagnosis not present

## 2015-01-22 DIAGNOSIS — E039 Hypothyroidism, unspecified: Secondary | ICD-10-CM | POA: Diagnosis not present

## 2015-01-22 DIAGNOSIS — B373 Candidiasis of vulva and vagina: Secondary | ICD-10-CM | POA: Diagnosis not present

## 2015-01-22 DIAGNOSIS — R350 Frequency of micturition: Secondary | ICD-10-CM

## 2015-01-22 DIAGNOSIS — B3731 Acute candidiasis of vulva and vagina: Secondary | ICD-10-CM

## 2015-01-22 LAB — URINALYSIS W MICROSCOPIC + REFLEX CULTURE
Bilirubin Urine: NEGATIVE
CRYSTALS: NONE SEEN [HPF]
Casts: NONE SEEN [LPF]
GLUCOSE, UA: NEGATIVE
Ketones, ur: NEGATIVE
NITRITE: NEGATIVE
Protein, ur: NEGATIVE
Yeast: NONE SEEN [HPF]
pH: 7 (ref 5.0–8.0)

## 2015-01-22 LAB — TSH: TSH: 2.137 u[IU]/mL (ref 0.350–4.500)

## 2015-01-22 MED ORDER — LEVOTHYROXINE SODIUM 75 MCG PO TABS: ORAL_TABLET | ORAL | Status: DC

## 2015-01-22 MED ORDER — TERCONAZOLE 0.8 % VA CREA: 1.0000 | TOPICAL_CREAM | Freq: Every day | VAGINAL | Status: DC

## 2015-01-22 NOTE — Progress Notes (Addendum)
Patient ID: Dorothy Lynch, female   DOB: 07-01-1960, 54 y.o.   MRN: [id number] Presents with complaint of vaginal discharge with itching and questionable odor for the past week. Has used Diflucan 2 doses with minimal relief Denies urinary symptoms, abdominal pain or fever. Scheduled for hip replacement surgery in 3 days from osteoarthritis. Currently on Synthroid 75 g and needs a TSH rechecked. Reports was off Synthroid for 4 days and now back on it for 3 days, rx was denied originally.  Exam: Appears well. External genitalia erythematous at introitus, speculum exam moderate amount of a curdy discharge noted, wet prep positive for yeast. Bimanual no adnexal tenderness or CMT. UA: Trace leukocytes, 0-5 WBCs, few bacteria  Yeast vaginitis  Plan: Terazol 3 one applicator at bedtime 3 with refill. TSH pending. Continue Synthroid 75 g. urine culture pending.

## 2015-01-22 NOTE — Patient Instructions (Signed)

## 2015-01-22 NOTE — Addendum Note (Signed)
Addended by: Burnett Kanaris on: 01/22/2015 12:46 PM   Modules accepted: Orders

## 2015-01-23 ENCOUNTER — Telehealth: Payer: Self-pay | Admitting: Women's Health

## 2015-01-23 LAB — WET PREP FOR TRICH, YEAST, CLUE
CLUE CELLS WET PREP: NONE SEEN
TRICH WET PREP: NONE SEEN

## 2015-01-23 MED ORDER — CLINDAMYCIN PHOSPHATE 900 MG/50ML IV SOLN
900.0000 mg | INTRAVENOUS | Status: AC
  Administered 2015-01-24: 900 mg via INTRAVENOUS
  Filled 2015-01-23: qty 50

## 2015-01-23 NOTE — Telephone Encounter (Signed)
Telephone call to [place] reviewed TSH 2.1 continue 75 g daily.

## 2015-01-24 ENCOUNTER — Inpatient Hospital Stay (HOSPITAL_COMMUNITY): Payer: PRIVATE HEALTH INSURANCE

## 2015-01-24 ENCOUNTER — Inpatient Hospital Stay (HOSPITAL_COMMUNITY): Payer: PRIVATE HEALTH INSURANCE | Admitting: Anesthesiology

## 2015-01-24 ENCOUNTER — Encounter (HOSPITAL_COMMUNITY)
Admission: RE | Disposition: A | Discharge status: Home Health | Payer: Self-pay | Source: Ambulatory Visit | Attending: Orthopedic Surgery

## 2015-01-24 ENCOUNTER — Inpatient Hospital Stay (HOSPITAL_COMMUNITY)
Admission: RE | Admit: 2015-01-24 | Discharge: 2015-01-26 | DRG: 470 | Disposition: A | Discharge status: Home Health | Payer: PRIVATE HEALTH INSURANCE | Source: Ambulatory Visit | Attending: Orthopedic Surgery | Admitting: Orthopedic Surgery

## 2015-01-24 ENCOUNTER — Encounter (HOSPITAL_COMMUNITY): Payer: Self-pay | Admitting: *Deleted

## 2015-01-24 DIAGNOSIS — Z880 Allergy status to penicillin: Secondary | ICD-10-CM | POA: Diagnosis not present

## 2015-01-24 DIAGNOSIS — M1612 Unilateral primary osteoarthritis, left hip: Secondary | ICD-10-CM | POA: Diagnosis present

## 2015-01-24 DIAGNOSIS — Z882 Allergy status to sulfonamides status: Secondary | ICD-10-CM

## 2015-01-24 DIAGNOSIS — G4700 Insomnia, unspecified: Secondary | ICD-10-CM | POA: Diagnosis present

## 2015-01-24 DIAGNOSIS — Z79899 Other long term (current) drug therapy: Secondary | ICD-10-CM | POA: Diagnosis not present

## 2015-01-24 DIAGNOSIS — F329 Major depressive disorder, single episode, unspecified: Secondary | ICD-10-CM | POA: Diagnosis present

## 2015-01-24 DIAGNOSIS — Z419 Encounter for procedure for purposes other than remedying health state, unspecified: Secondary | ICD-10-CM

## 2015-01-24 DIAGNOSIS — Z8249 Family history of ischemic heart disease and other diseases of the circulatory system: Secondary | ICD-10-CM | POA: Diagnosis not present

## 2015-01-24 DIAGNOSIS — C44621 Squamous cell carcinoma of skin of unspecified upper limb, including shoulder: Secondary | ICD-10-CM | POA: Diagnosis present

## 2015-01-24 DIAGNOSIS — F419 Anxiety disorder, unspecified: Secondary | ICD-10-CM | POA: Diagnosis present

## 2015-01-24 DIAGNOSIS — E039 Hypothyroidism, unspecified: Secondary | ICD-10-CM | POA: Diagnosis present

## 2015-01-24 DIAGNOSIS — M25552 Pain in left hip: Secondary | ICD-10-CM | POA: Diagnosis present

## 2015-01-24 HISTORY — PX: TOTAL HIP ARTHROPLASTY: SHX124

## 2015-01-24 LAB — URINE CULTURE: Colony Count: 9000

## 2015-01-24 SURGERY — ARTHROPLASTY, HIP, TOTAL, ANTERIOR APPROACH
Anesthesia: General | Site: Hip | Laterality: Left

## 2015-01-24 MED ORDER — ARTIFICIAL TEARS OP OINT
TOPICAL_OINTMENT | OPHTHALMIC | Status: AC
  Filled 2015-01-24: qty 3.5

## 2015-01-24 MED ORDER — METHOCARBAMOL 1000 MG/10ML IJ SOLN
500.0000 mg | Freq: Four times a day (QID) | INTRAMUSCULAR | Status: DC | PRN
  Filled 2015-01-24: qty 5

## 2015-01-24 MED ORDER — ONDANSETRON HCL 4 MG/2ML IJ SOLN
INTRAMUSCULAR | Status: AC
  Filled 2015-01-24: qty 2

## 2015-01-24 MED ORDER — ZOLPIDEM TARTRATE 5 MG PO TABS: 5.0000 mg | ORAL_TABLET | Freq: Every evening | ORAL | Status: DC | PRN

## 2015-01-24 MED ORDER — MIDAZOLAM HCL 2 MG/2ML IJ SOLN
INTRAMUSCULAR | Status: AC
  Filled 2015-01-24: qty 4

## 2015-01-24 MED ORDER — VENLAFAXINE HCL ER 37.5 MG PO CP24
37.5000 mg | ORAL_CAPSULE | Freq: Every day | ORAL | Status: DC
  Administered 2015-01-24 – 2015-01-25 (×2): 37.5 mg via ORAL
  Filled 2015-01-24 (×3): qty 1

## 2015-01-24 MED ORDER — FENTANYL CITRATE (PF) 250 MCG/5ML IJ SOLN
INTRAMUSCULAR | Status: AC
  Filled 2015-01-24: qty 5

## 2015-01-24 MED ORDER — METHOCARBAMOL 500 MG PO TABS: 500.0000 mg | ORAL_TABLET | Freq: Three times a day (TID) | ORAL | Status: DC | PRN

## 2015-01-24 MED ORDER — OXYCODONE-ACETAMINOPHEN 5-325 MG PO TABS
1.0000 | ORAL_TABLET | ORAL | Status: DC | PRN
  Administered 2015-01-24 – 2015-01-26 (×12): 2 via ORAL
  Filled 2015-01-24 (×11): qty 2

## 2015-01-24 MED ORDER — ASPIRIN EC 325 MG PO TBEC
325.0000 mg | DELAYED_RELEASE_TABLET | Freq: Two times a day (BID) | ORAL | Status: DC
  Administered 2015-01-24 – 2015-01-26 (×4): 325 mg via ORAL
  Filled 2015-01-24 (×4): qty 1

## 2015-01-24 MED ORDER — PROMETHAZINE HCL 25 MG/ML IJ SOLN
6.2500 mg | INTRAMUSCULAR | Status: DC | PRN
Start: 2015-01-24 — End: 2015-01-24

## 2015-01-24 MED ORDER — ALPRAZOLAM 0.5 MG PO TABS
0.5000 mg | ORAL_TABLET | Freq: Four times a day (QID) | ORAL | Status: DC | PRN
  Administered 2015-01-24: 0.5 mg via ORAL
  Filled 2015-01-24: qty 1

## 2015-01-24 MED ORDER — ESTRADIOL 2 MG PO TABS
3.0000 mg | ORAL_TABLET | Freq: Every evening | ORAL | Status: DC
  Administered 2015-01-25: 3 mg via ORAL
  Filled 2015-01-24 (×2): qty 1

## 2015-01-24 MED ORDER — FENTANYL CITRATE (PF) 100 MCG/2ML IJ SOLN
INTRAMUSCULAR | Status: DC | PRN
  Administered 2015-01-24: 50 ug via INTRAVENOUS
  Administered 2015-01-24 (×2): 25 ug via INTRAVENOUS
  Administered 2015-01-24 (×3): 50 ug via INTRAVENOUS

## 2015-01-24 MED ORDER — PROPOFOL 10 MG/ML IV BOLUS
INTRAVENOUS | Status: AC
  Filled 2015-01-24: qty 20

## 2015-01-24 MED ORDER — METHOCARBAMOL 500 MG PO TABS
ORAL_TABLET | ORAL | Status: AC
  Administered 2015-01-24: 500 mg via ORAL
  Filled 2015-01-24: qty 1

## 2015-01-24 MED ORDER — DEXAMETHASONE SODIUM PHOSPHATE 10 MG/ML IJ SOLN
10.0000 mg | Freq: Two times a day (BID) | INTRAMUSCULAR | Status: AC
  Administered 2015-01-24 (×2): 10 mg via INTRAVENOUS
  Filled 2015-01-24 (×2): qty 1

## 2015-01-24 MED ORDER — HYDROMORPHONE HCL 1 MG/ML IJ SOLN
0.2500 mg | INTRAMUSCULAR | Status: DC | PRN
  Administered 2015-01-24 (×4): 0.5 mg via INTRAVENOUS

## 2015-01-24 MED ORDER — METHOCARBAMOL 500 MG PO TABS
500.0000 mg | ORAL_TABLET | Freq: Four times a day (QID) | ORAL | Status: DC | PRN
  Administered 2015-01-24 – 2015-01-26 (×5): 500 mg via ORAL
  Filled 2015-01-24 (×4): qty 1

## 2015-01-24 MED ORDER — OXYCODONE HCL 5 MG/5ML PO SOLN: 5.0000 mg | Freq: Once | ORAL | Status: DC | PRN

## 2015-01-24 MED ORDER — EPHEDRINE SULFATE 50 MG/ML IJ SOLN
INTRAMUSCULAR | Status: AC
  Filled 2015-01-24: qty 1

## 2015-01-24 MED ORDER — PROPOFOL INFUSION 10 MG/ML OPTIME
INTRAVENOUS | Status: DC | PRN
  Administered 2015-01-24: 100 ug/kg/min via INTRAVENOUS

## 2015-01-24 MED ORDER — ESCITALOPRAM OXALATE 10 MG PO TABS
10.0000 mg | ORAL_TABLET | Freq: Every day | ORAL | Status: DC
  Administered 2015-01-24 – 2015-01-26 (×3): 10 mg via ORAL
  Filled 2015-01-24 (×3): qty 1

## 2015-01-24 MED ORDER — LAMOTRIGINE 100 MG PO TABS
200.0000 mg | ORAL_TABLET | Freq: Every day | ORAL | Status: DC
  Administered 2015-01-24 – 2015-01-26 (×3): 200 mg via ORAL
  Filled 2015-01-24 (×3): qty 2

## 2015-01-24 MED ORDER — SUCCINYLCHOLINE CHLORIDE 20 MG/ML IJ SOLN
INTRAMUSCULAR | Status: AC
  Filled 2015-01-24: qty 1

## 2015-01-24 MED ORDER — MIDAZOLAM HCL 5 MG/5ML IJ SOLN
INTRAMUSCULAR | Status: DC | PRN
  Administered 2015-01-24: 2 mg via INTRAVENOUS

## 2015-01-24 MED ORDER — ASPIRIN EC 325 MG PO TBEC: 325.0000 mg | DELAYED_RELEASE_TABLET | Freq: Two times a day (BID) | ORAL | Status: DC

## 2015-01-24 MED ORDER — SODIUM CHLORIDE 0.9 % IV SOLN: INTRAVENOUS | Status: DC

## 2015-01-24 MED ORDER — TRANEXAMIC ACID 1000 MG/10ML IV SOLN
1000.0000 mg | INTRAVENOUS | Status: AC
  Administered 2015-01-24: 1000 mg via INTRAVENOUS
  Filled 2015-01-24: qty 10

## 2015-01-24 MED ORDER — ONDANSETRON HCL 4 MG PO TABS: 4.0000 mg | ORAL_TABLET | Freq: Four times a day (QID) | ORAL | Status: DC | PRN

## 2015-01-24 MED ORDER — VENLAFAXINE HCL ER 37.5 MG PO CP24
37.5000 mg | ORAL_CAPSULE | Freq: Every day | ORAL | Status: DC
  Filled 2015-01-24: qty 1

## 2015-01-24 MED ORDER — HYDROMORPHONE HCL 1 MG/ML IJ SOLN
INTRAMUSCULAR | Status: AC
  Administered 2015-01-24: 0.5 mg via INTRAVENOUS
  Filled 2015-01-24: qty 2

## 2015-01-24 MED ORDER — ACETAMINOPHEN 325 MG PO TABS: 650.0000 mg | ORAL_TABLET | Freq: Four times a day (QID) | ORAL | Status: DC | PRN

## 2015-01-24 MED ORDER — BUPIVACAINE HCL 0.5 % IJ SOLN
INTRAMUSCULAR | Status: DC | PRN
  Administered 2015-01-24: 20 mL

## 2015-01-24 MED ORDER — BUPIVACAINE LIPOSOME 1.3 % IJ SUSP
20.0000 mL | Freq: Once | INTRAMUSCULAR | Status: DC
  Filled 2015-01-24: qty 20

## 2015-01-24 MED ORDER — 0.9 % SODIUM CHLORIDE (POUR BTL) OPTIME
TOPICAL | Status: DC | PRN
  Administered 2015-01-24: 1000 mL

## 2015-01-24 MED ORDER — KETOROLAC TROMETHAMINE 15 MG/ML IJ SOLN
15.0000 mg | Freq: Three times a day (TID) | INTRAMUSCULAR | Status: AC
  Administered 2015-01-24 – 2015-01-25 (×4): 15 mg via INTRAVENOUS
  Filled 2015-01-24 (×4): qty 1

## 2015-01-24 MED ORDER — SCOPOLAMINE 1 MG/3DAYS TD PT72
MEDICATED_PATCH | TRANSDERMAL | Status: DC | PRN
  Administered 2015-01-24: 1 via TRANSDERMAL

## 2015-01-24 MED ORDER — PROGESTERONE MICRONIZED 100 MG PO CAPS
100.0000 mg | ORAL_CAPSULE | Freq: Every day | ORAL | Status: DC
  Administered 2015-01-24 – 2015-01-25 (×2): 100 mg via ORAL
  Filled 2015-01-24 (×4): qty 1

## 2015-01-24 MED ORDER — OXYCODONE-ACETAMINOPHEN 5-325 MG PO TABS
ORAL_TABLET | ORAL | Status: AC
  Administered 2015-01-24: 2 via ORAL
  Filled 2015-01-24: qty 2

## 2015-01-24 MED ORDER — GLYCOPYRROLATE 0.2 MG/ML IJ SOLN
INTRAMUSCULAR | Status: AC
  Filled 2015-01-24: qty 3

## 2015-01-24 MED ORDER — NEOSTIGMINE METHYLSULFATE 10 MG/10ML IV SOLN
INTRAVENOUS | Status: AC
  Filled 2015-01-24: qty 1

## 2015-01-24 MED ORDER — SODIUM CHLORIDE 0.9 % IJ SOLN
INTRAMUSCULAR | Status: AC
  Filled 2015-01-24: qty 10

## 2015-01-24 MED ORDER — ONDANSETRON HCL 4 MG/2ML IJ SOLN
4.0000 mg | Freq: Four times a day (QID) | INTRAMUSCULAR | Status: DC | PRN
  Administered 2015-01-24: 4 mg via INTRAVENOUS
  Filled 2015-01-24: qty 2

## 2015-01-24 MED ORDER — BUPIVACAINE LIPOSOME 1.3 % IJ SUSP
20.0000 mL | INTRAMUSCULAR | Status: AC
  Administered 2015-01-24: 20 mL
  Filled 2015-01-24: qty 20

## 2015-01-24 MED ORDER — ROCURONIUM BROMIDE 50 MG/5ML IV SOLN
INTRAVENOUS | Status: AC
  Filled 2015-01-24: qty 1

## 2015-01-24 MED ORDER — POLYETHYLENE GLYCOL 3350 17 G PO PACK: 17.0000 g | PACK | Freq: Every day | ORAL | Status: DC | PRN

## 2015-01-24 MED ORDER — CEFAZOLIN SODIUM-DEXTROSE 2-3 GM-% IV SOLR
2.0000 g | Freq: Four times a day (QID) | INTRAVENOUS | Status: AC
  Administered 2015-01-24 (×2): 2 g via INTRAVENOUS
  Filled 2015-01-24 (×3): qty 50

## 2015-01-24 MED ORDER — HYDROMORPHONE HCL 1 MG/ML IJ SOLN
0.5000 mg | INTRAMUSCULAR | Status: DC | PRN
  Administered 2015-01-26: 1 mg via INTRAVENOUS
  Filled 2015-01-24: qty 1

## 2015-01-24 MED ORDER — KETOROLAC TROMETHAMINE 15 MG/ML IJ SOLN
INTRAMUSCULAR | Status: AC
  Administered 2015-01-24: 15 mg via INTRAVENOUS
  Filled 2015-01-24: qty 1

## 2015-01-24 MED ORDER — ONDANSETRON HCL 4 MG/2ML IJ SOLN
INTRAMUSCULAR | Status: DC | PRN
  Administered 2015-01-24: 4 mg via INTRAVENOUS

## 2015-01-24 MED ORDER — DIPHENHYDRAMINE HCL 12.5 MG/5ML PO ELIX: 12.5000 mg | ORAL_SOLUTION | ORAL | Status: DC | PRN

## 2015-01-24 MED ORDER — ZOLPIDEM TARTRATE 5 MG PO TABS: 10.0000 mg | ORAL_TABLET | Freq: Every evening | ORAL | Status: DC | PRN

## 2015-01-24 MED ORDER — LIDOCAINE HCL (CARDIAC) 20 MG/ML IV SOLN
INTRAVENOUS | Status: AC
  Filled 2015-01-24: qty 5

## 2015-01-24 MED ORDER — DOCUSATE SODIUM 100 MG PO CAPS
100.0000 mg | ORAL_CAPSULE | Freq: Two times a day (BID) | ORAL | Status: DC
  Administered 2015-01-24 – 2015-01-26 (×4): 100 mg via ORAL
  Filled 2015-01-24 (×4): qty 1

## 2015-01-24 MED ORDER — BUPIVACAINE HCL (PF) 0.75 % IJ SOLN
INTRAMUSCULAR | Status: DC | PRN
  Administered 2015-01-24: 2 mL via INTRATHECAL

## 2015-01-24 MED ORDER — ALPRAZOLAM 0.5 MG PO TABS: 0.5000 mg | ORAL_TABLET | Freq: Four times a day (QID) | ORAL | Status: DC | PRN

## 2015-01-24 MED ORDER — ACETAMINOPHEN 650 MG RE SUPP: 650.0000 mg | Freq: Four times a day (QID) | RECTAL | Status: DC | PRN

## 2015-01-24 MED ORDER — LACTATED RINGERS IV SOLN
INTRAVENOUS | Status: DC | PRN
  Administered 2015-01-24 (×2): via INTRAVENOUS

## 2015-01-24 MED ORDER — ALUM & MAG HYDROXIDE-SIMETH 200-200-20 MG/5ML PO SUSP: 30.0000 mL | ORAL | Status: DC | PRN

## 2015-01-24 MED ORDER — BISACODYL 5 MG PO TBEC
5.0000 mg | DELAYED_RELEASE_TABLET | Freq: Every day | ORAL | Status: DC | PRN
  Administered 2015-01-26: 5 mg via ORAL
  Filled 2015-01-24: qty 1

## 2015-01-24 MED ORDER — EPHEDRINE SULFATE 50 MG/ML IJ SOLN
INTRAMUSCULAR | Status: DC | PRN
  Administered 2015-01-24 (×6): 5 mg via INTRAVENOUS

## 2015-01-24 MED ORDER — LEVOTHYROXINE SODIUM 75 MCG PO TABS
75.0000 ug | ORAL_TABLET | Freq: Every day | ORAL | Status: DC
  Administered 2015-01-25 – 2015-01-26 (×2): 75 ug via ORAL
  Filled 2015-01-24 (×2): qty 1

## 2015-01-24 MED ORDER — OXYCODONE HCL 5 MG PO TABS: 5.0000 mg | ORAL_TABLET | Freq: Once | ORAL | Status: DC | PRN

## 2015-01-24 MED ORDER — PROMETHAZINE HCL 25 MG/ML IJ SOLN: 12.5000 mg | Freq: Four times a day (QID) | INTRAMUSCULAR | Status: DC | PRN

## 2015-01-24 MED ORDER — LIDOCAINE HCL (CARDIAC) 20 MG/ML IV SOLN
INTRAVENOUS | Status: DC | PRN
  Administered 2015-01-24: 40 mg via INTRAVENOUS

## 2015-01-24 MED ORDER — OXYCODONE-ACETAMINOPHEN 5-325 MG PO TABS: 1.0000 | ORAL_TABLET | Freq: Four times a day (QID) | ORAL | Status: DC | PRN

## 2015-01-24 SURGICAL SUPPLY — 59 items
BENZOIN TINCTURE PRP APPL 2/3 (GAUZE/BANDAGES/DRESSINGS) ×2 IMPLANT
BLADE SAW SGTL 18X1.27X75 (BLADE) ×2 IMPLANT
BLADE SURG ROTATE 9660 (MISCELLANEOUS) IMPLANT
CAPT HIP TOTAL 2 ×2 IMPLANT
CELLS DAT CNTRL 66122 CELL SVR (MISCELLANEOUS) ×1 IMPLANT
CLSR STERI-STRIP ANTIMIC 1/2X4 (GAUZE/BANDAGES/DRESSINGS) ×2 IMPLANT
COVER PERINEAL POST (MISCELLANEOUS) ×2 IMPLANT
COVER SURGICAL LIGHT HANDLE (MISCELLANEOUS) ×2 IMPLANT
DRAPE C-ARM 42X72 X-RAY (DRAPES) ×2 IMPLANT
DRAPE IMP U-DRAPE 54X76 (DRAPES) ×2 IMPLANT
DRAPE STERI IOBAN 125X83 (DRAPES) ×2 IMPLANT
DRAPE U-SHAPE 47X51 STRL (DRAPES) ×6 IMPLANT
DRESSING AQUACEL AQ EXTRA 4X5 (GAUZE/BANDAGES/DRESSINGS) ×2 IMPLANT
DRSG AQUACEL AG ADV 3.5X10 (GAUZE/BANDAGES/DRESSINGS) ×2 IMPLANT
DRSG MEPILEX BORDER 4X8 (GAUZE/BANDAGES/DRESSINGS) ×2 IMPLANT
DURAPREP 26ML APPLICATOR (WOUND CARE) ×2 IMPLANT
ELECT BLADE 4.0 EZ CLEAN MEGAD (MISCELLANEOUS) ×2
ELECT CAUTERY BLADE 6.4 (BLADE) ×2 IMPLANT
ELECT REM PT RETURN 9FT ADLT (ELECTROSURGICAL) ×2
ELECTRODE BLDE 4.0 EZ CLN MEGD (MISCELLANEOUS) ×1 IMPLANT
ELECTRODE REM PT RTRN 9FT ADLT (ELECTROSURGICAL) ×1 IMPLANT
GAUZE XEROFORM 1X8 LF (GAUZE/BANDAGES/DRESSINGS) ×2 IMPLANT
GLOVE BIO SURGEON STRL SZ 6.5 (GLOVE) ×6 IMPLANT
GLOVE BIO SURGEON STRL SZ7.5 (GLOVE) ×4 IMPLANT
GLOVE BIOGEL PI IND STRL 8 (GLOVE) ×2 IMPLANT
GLOVE BIOGEL PI INDICATOR 8 (GLOVE) ×2
GLOVE ECLIPSE 7.5 STRL STRAW (GLOVE) ×8 IMPLANT
GLOVE SURG SS PI 7.0 STRL IVOR (GLOVE) ×2 IMPLANT
GOWN STRL REUS W/ TWL LRG LVL3 (GOWN DISPOSABLE) ×2 IMPLANT
GOWN STRL REUS W/ TWL XL LVL3 (GOWN DISPOSABLE) ×2 IMPLANT
GOWN STRL REUS W/TWL LRG LVL3 (GOWN DISPOSABLE) ×2
GOWN STRL REUS W/TWL XL LVL3 (GOWN DISPOSABLE) ×2
HOOD PEEL AWAY FACE SHEILD DIS (HOOD) ×4 IMPLANT
KIT BASIN OR (CUSTOM PROCEDURE TRAY) ×2 IMPLANT
KIT ROOM TURNOVER OR (KITS) ×2 IMPLANT
MANIFOLD NEPTUNE II (INSTRUMENTS) ×2 IMPLANT
NEEDLE SPNL 22GX3.5 QUINCKE BK (NEEDLE) ×2 IMPLANT
NS IRRIG 1000ML POUR BTL (IV SOLUTION) ×2 IMPLANT
PACK TOTAL JOINT (CUSTOM PROCEDURE TRAY) ×2 IMPLANT
PACK UNIVERSAL I (CUSTOM PROCEDURE TRAY) ×2 IMPLANT
PAD ARMBOARD 7.5X6 YLW CONV (MISCELLANEOUS) ×4 IMPLANT
RTRCTR WOUND ALEXIS 18CM MED (MISCELLANEOUS) ×2
RTRCTR WOUND ALEXIS 18CM SML (INSTRUMENTS) ×2
SAVER CELL AAL HAEMONETICS (INSTRUMENTS) ×1 IMPLANT
SPONGE LAP 18X18 X RAY DECT (DISPOSABLE) ×2 IMPLANT
STAPLER VISISTAT 35W (STAPLE) IMPLANT
SUT ETHIBOND NAB CT1 #1 30IN (SUTURE) ×4 IMPLANT
SUT MNCRL AB 3-0 PS2 18 (SUTURE) IMPLANT
SUT VIC AB 0 CT1 27 (SUTURE) ×1
SUT VIC AB 0 CT1 27XBRD ANBCTR (SUTURE) ×1 IMPLANT
SUT VIC AB 1 CT1 27 (SUTURE) ×2
SUT VIC AB 1 CT1 27XBRD ANBCTR (SUTURE) ×2 IMPLANT
SUT VIC AB 2-0 CT1 27 (SUTURE) ×1
SUT VIC AB 2-0 CT1 TAPERPNT 27 (SUTURE) ×1 IMPLANT
SYR 50ML LL SCALE MARK (SYRINGE) ×2 IMPLANT
TOWEL OR 17X24 6PK STRL BLUE (TOWEL DISPOSABLE) ×2 IMPLANT
TOWEL OR 17X26 10 PK STRL BLUE (TOWEL DISPOSABLE) ×2 IMPLANT
TRAY CATH 16FR W/PLASTIC CATH (SET/KITS/TRAYS/PACK) ×2 IMPLANT
WATER STERILE IRR 1000ML POUR (IV SOLUTION) ×4 IMPLANT

## 2015-01-24 NOTE — Care Management Note (Signed)
Case Management Note  Patient Details  Name: Dorothy Lynch MRN: [id number] Date of Birth: 1960-06-29  Subjective/Objective:      S/p left total hip arthroplasty              Action/Plan: Set up with [facility] for HHPT by MD office.   Expected Discharge Date:                  Expected Discharge Plan:  [name]  In-House Referral:     Discharge planning Services  CM Consult  Post Acute Care Choice:  Home Health Choice offered to:     DME Arranged:    DME Agency:     HH Arranged:  PT [place]:  [place]  Status of Service:  In process, will continue to follow  Medicare Important Message Given:    Date Medicare IM Given:    Medicare IM give by:    Date Additional Medicare IM Given:    Additional Medicare Important Message give by:     If discussed at [place] of Stay Meetings, dates discussed:    Additional Comments:  Nila Nephew, RN 01/24/2015, 11:52 AM

## 2015-01-24 NOTE — Progress Notes (Signed)
Utilization review completed.  

## 2015-01-24 NOTE — H&P (Signed)
TOTAL HIP ADMISSION H&P  Patient is admitted for left total hip arthroplasty.  Subjective:  Chief Complaint: left hip pain  HPI: Dorothy Lynch, 54 y.o. female, has a history of pain and functional disability in the left hip(s) due to arthritis and patient has failed non-surgical conservative treatments for greater than 12 weeks to include NSAID's and/or analgesics, corticosteriod injections, flexibility and strengthening excercises, supervised PT with diminished ADL's post treatment and activity modification.  Onset of symptoms was gradual starting 3 years ago with gradually worsening course since that time.The patient noted no past surgery on the left hip(s).  Patient currently rates pain in the left hip at 8 out of 10 with activity. Patient has night pain, worsening of pain with activity and weight bearing, trendelenberg gait, pain that interfers with activities of daily living, pain with passive range of motion, crepitus and joint swelling. Patient has evidence of subchondral cysts, subchondral sclerosis and joint space narrowing by imaging studies. This condition presents safety issues increasing the risk of falls. This patient has had failure of all reasonable conservative care.  There is no current active infection.  Patient Active Problem List   Diagnosis Date Noted  . Cancer   . Mood disorder 01/18/2011  . Insomnia 01/18/2011   Past Medical History  Diagnosis Date  . Allergy   . Hypothyroidism 07/2012  . Scoliosis   . PONV (postoperative nausea and vomiting)   . Migraines   . Headache(784.0)   . DDD (degenerative disc disease)   . Cancer     squamous cell skin cancer-shoulder x 3  . Anemia 07/2012  . Episodic low back pain   . Depression   . Anxiety   . Disease of vein   . History of bronchitis     Past Surgical History  Procedure Laterality Date  . Achilles tendon surgery  2001  . Abdominal surgery  1995    inguinal hernia, double  . Vein injections    . Breast surgery   march 2011    breast [id number], fibrocyctic changes  . Facial cosmetic surgery  2012  . Colonoscopy      Prescriptions prior to admission  Medication Sig Dispense Refill Last Dose  . BIOTIN PO Take 1 tablet by mouth daily.    Taking  . cholecalciferol (VITAMIN D) 1000 UNITS tablet Take 1,000 Units by mouth daily.   Taking  . escitalopram (LEXAPRO) 10 MG tablet Take 1 tablet (10 mg total) by mouth daily. 30 tablet 12 Taking  . estradiol (ESTRACE) 1 MG tablet Take 3 tablets (3 mg total) by mouth every evening. 90 tablet 12 Taking  . lamoTRIgine (LAMICTAL) 100 MG tablet TAKE ONE TABLET BY MOUTH TWICE DAILY (Patient taking differently: Take 2 tablets by mouth once daily) 60 tablet 5 Taking  . Multiple Vitamins-Minerals (PRESERVISION AREDS PO) Take 1 tablet by mouth daily.   Taking  . Naproxen Sodium (ALEVE PO) Take 220 mg by mouth 4 (four) times daily.    Taking  . Omega-3 Fatty Acids (FISH OIL PO) Take 1 capsule by mouth daily.    Taking  . OVER THE COUNTER MEDICATION Apply 1 application topically as directed. Glucosamine cream   Taking  . Polyethyl Glycol-Propyl Glycol (SYSTANE OP) Place 1 drop into both eyes daily as needed (for dry eyes).   Taking  . Probiotic Product (PROBIOTIC PO) Take 1 tablet by mouth daily.    Taking  . progesterone (PROMETRIUM) 100 MG capsule Take 1 capsule (100 mg total) by  mouth at bedtime. 30 capsule 12 Taking  . tretinoin (RETIN-A) 0.05 % cream Apply topically at bedtime. 45 g 1 Taking  . venlafaxine XR (EFFEXOR-XR) 37.5 MG 24 hr capsule TAKE 1 CAPSULE by mouth EVERY DAY 30 capsule 12 Taking  . zolpidem (AMBIEN) 10 MG tablet TAKE ONE TABLET BY MOUTH AT BEDTIME AS NEEDED FOR SLEEP 90 tablet 1 Taking  . ALPRAZolam (XANAX) 0.5 MG tablet as needed.   Taking  . Glucosamine HCl (GLUCOSAMINE PO) Take 1 tablet by mouth daily.   Taking  . levothyroxine (SYNTHROID, LEVOTHROID) 75 MCG tablet TAKE 1 TABLET BY MOUTH EVERY DAY BEFORE BREAKFAST 90 tablet 1   . terconazole (TERAZOL  3) 0.8 % vaginal cream Place 1 applicator vaginally at bedtime. 20 g 1    Allergies  Allergen Reactions  . Penicillins Other (See Comments)    Headaches   . Sulfonamide Derivatives Other (See Comments)    Migraines    Social History  Substance Use Topics  . Smoking status: Never Smoker   . Smokeless tobacco: Never Used  . Alcohol Use: 4.2 oz/week    7 Standard drinks or equivalent per week     Comment: daily- wine & beer    Family History  Problem Relation Age of Onset  . Diabetes Father   . Hypertension Father   . Stroke Father   . Cancer Maternal Grandmother 43    lung  . Stroke Maternal Grandfather   . Heart disease Paternal Grandmother     mi  . Stroke Paternal Grandfather   . Thyroid disease Mother   . Breast cancer Mother 78     ROS ROS: I have reviewed the patient's review of systems thoroughly and there are no positive responses as relates to the HPI.  Objective:  Physical Exam  Vital signs in last 24 hours:   Well-developed well-nourished patient in no acute distress. Alert and oriented x3 HEENT:within normal limits Cardiac: Regular rate and rhythm Pulmonary: Lungs clear to auscultation Abdomen: Soft and nontender.  Normal active bowel sounds  Musculoskeletal: (left hip: Painful range of motion, limited internal rotation, neurovascularly intact distally.  Labs: Recent Results (from the past 2160 hour(s))  Surgical pcr screen     Status: None   Collection Time: 01/15/15  4:09 PM  Result Value Ref Range   MRSA, PCR NEGATIVE NEGATIVE   Staphylococcus aureus NEGATIVE NEGATIVE    Comment:        The Xpert SA Assay (FDA approved for NASAL specimens in patients over 94 years of age), is one component of a comprehensive surveillance program.  Test performance has been validated by [facility] for patients greater than or equal to 2 year old. It is not intended to diagnose infection nor to guide or monitor treatment.   APTT     Status: None    Collection Time: 01/15/15  4:10 PM  Result Value Ref Range   aPTT 30 24 - 37 seconds  CBC WITH DIFFERENTIAL     Status: None   Collection Time: 01/15/15  4:10 PM  Result Value Ref Range   WBC 7.4 4.0 - 10.5 K/uL   RBC 3.98 3.87 - 5.11 MIL/uL   Hemoglobin 12.7 12.0 - 15.0 g/dL   HCT 38.4 36.0 - 46.0 %   MCV 96.5 78.0 - 100.0 fL   MCH 31.9 26.0 - 34.0 pg   MCHC 33.1 30.0 - 36.0 g/dL   RDW 12.7 11.5 - 15.5 %   Platelets 258 150 -  400 K/uL   Neutrophils Relative % 57 43 - 77 %   Neutro Abs 4.3 1.7 - 7.7 K/uL   Lymphocytes Relative 32 12 - 46 %   Lymphs Abs 2.3 0.7 - 4.0 K/uL   Monocytes Relative 8 3 - 12 %   Monocytes Absolute 0.6 0.1 - 1.0 K/uL   Eosinophils Relative 2 0 - 5 %   Eosinophils Absolute 0.2 0.0 - 0.7 K/uL   Basophils Relative 1 0 - 1 %   Basophils Absolute 0.0 0.0 - 0.1 K/uL  Comprehensive metabolic panel     Status: Abnormal   Collection Time: 01/15/15  4:10 PM  Result Value Ref Range   Sodium 136 135 - 145 mmol/L   Potassium 4.0 3.5 - 5.1 mmol/L   Chloride 103 101 - 111 mmol/L   CO2 26 22 - 32 mmol/L   Glucose, Bld 113 (H) 65 - 99 mg/dL   BUN 12 6 - 20 mg/dL   Creatinine, Ser 1.02 (H) 0.44 - 1.00 mg/dL   Calcium 8.8 (L) 8.9 - 10.3 mg/dL   Total Protein 6.4 (L) 6.5 - 8.1 g/dL   Albumin 3.4 (L) 3.5 - 5.0 g/dL   AST 18 15 - 41 U/L   ALT 15 14 - 54 U/L   Alkaline Phosphatase 59 38 - 126 U/L   Total Bilirubin 0.3 0.3 - 1.2 mg/dL   GFR calc non Af Amer >60 >60 mL/min   GFR calc Af Amer >60 >60 mL/min    Comment: (NOTE) The eGFR has been calculated using the CKD EPI equation. This calculation has not been validated in all clinical situations. eGFR's persistently <60 mL/min signify possible Chronic Kidney Disease.    Anion gap 7 5 - 15  Protime-INR     Status: None   Collection Time: 01/15/15  4:10 PM  Result Value Ref Range   Prothrombin Time 12.5 11.6 - 15.2 seconds   INR 0.91 0.00 - 1.49  Urinalysis, Routine w reflex microscopic (not at [facility])     Status:  None   Collection Time: 01/15/15  4:10 PM  Result Value Ref Range   Color, Urine YELLOW YELLOW   APPearance CLEAR CLEAR   Specific Gravity, Urine 1.012 1.005 - 1.030   pH 6.0 5.0 - 8.0   Glucose, UA NEGATIVE NEGATIVE mg/dL   Hgb urine dipstick NEGATIVE NEGATIVE   Bilirubin Urine NEGATIVE NEGATIVE   Ketones, ur NEGATIVE NEGATIVE mg/dL   Protein, ur NEGATIVE NEGATIVE mg/dL   Urobilinogen, UA 0.2 0.0 - 1.0 mg/dL   Nitrite NEGATIVE NEGATIVE   Leukocytes, UA NEGATIVE NEGATIVE    Comment: MICROSCOPIC NOT DONE ON URINES WITH NEGATIVE PROTEIN, BLOOD, LEUKOCYTES, NITRITE, OR GLUCOSE <1000 mg/dL.  Type and screen     Status: None   Collection Time: 01/15/15  4:25 PM  Result Value Ref Range   ABO/RH(D) A POS    Antibody Screen NEG    Sample Expiration 01/29/2015   ABO/Rh     Status: None   Collection Time: 01/15/15  4:25 PM  Result Value Ref Range   ABO/RH(D) A POS   Urinalysis with Culture Reflex     Status: Abnormal   Collection Time: 01/22/15 12:05 PM  Result Value Ref Range   Color, Urine YELLOW YELLOW    Comment: ** Please note change in unit of measure and reference range(s). **      APPearance CLEAR CLEAR   Specific Gravity, Urine <1.005 1.001 - 1.035   pH 7.0  5.0 - 8.0   Glucose, UA NEGATIVE NEGATIVE   Bilirubin Urine NEGATIVE NEGATIVE   Ketones, ur NEGATIVE NEGATIVE   Hgb urine dipstick TRACE (A) NEGATIVE   Protein, ur NEGATIVE NEGATIVE   Nitrite NEGATIVE NEGATIVE   Leukocytes, UA TRACE (A) NEGATIVE   WBC, UA 0-5 <=5 WBC/HPF   RBC / HPF 0-2 <=2 RBC/HPF   Squamous Epithelial / LPF 0-5 <=5 HPF   Bacteria, UA FEW (A) NONE SEEN HPF   Crystals NONE SEEN NONE SEEN HPF   Casts NONE SEEN NONE SEEN LPF   Yeast NONE SEEN NONE SEEN HPF   Urine-Other SEE NOTE     Comment: *URINE CULTURE PENDING*  TSH     Status: None   Collection Time: 01/22/15 12:38 PM  Result Value Ref Range   TSH 2.137 0.350 - 4.500 uIU/mL  WET PREP FOR TRICH, YEAST, CLUE     Status: Abnormal    Collection Time: 01/23/15 10:40 AM  Result Value Ref Range   Yeast Wet Prep HPF POC MOD (A) NONE SEEN   Trich, Wet Prep NONE SEEN NONE SEEN   Clue Cells Wet Prep HPF POC NONE SEEN NONE SEEN   WBC, Wet Prep HPF POC MOD (A) NONE SEEN    Comment: MODERATE BACTERIA SEEN (7-12) EPITH. CELLS PER HPF AMINE NEGATIVE      Estimated body mass index is 23.32 kg/(m^2) as calculated from the following:   Height as of 09/12/14: 5' 9"  (1.753 m).   Weight as of 09/12/14: 158 lb (71.668 kg).   Imaging Review Plain radiographs demonstrate severe degenerative joint disease of the left hip(s). The bone quality appears to be good for age and reported activity level.  Assessment/Plan:  End stage arthritis, left hip(s)  The patient history, physical examination, clinical judgement of the provider and imaging studies are consistent with end stage degenerative joint disease of the left hip(s) and total hip arthroplasty is deemed medically necessary. The treatment options including medical management, injection therapy, arthroscopy and arthroplasty were discussed at length. The risks and benefits of total hip arthroplasty were presented and reviewed. The risks due to aseptic loosening, infection, stiffness, dislocation/subluxation,  thromboembolic complications and other imponderables were discussed.  The patient acknowledged the explanation, agreed to proceed with the plan and consent was signed. Patient is being admitted for inpatient treatment for surgery, pain control, PT, OT, prophylactic antibiotics, VTE prophylaxis, progressive ambulation and ADL's and discharge planning.The patient is planning to be discharged home with home health services

## 2015-01-24 NOTE — Brief Op Note (Signed)
01/24/2015  9:29 AM  PATIENT:  Dorothy Lynch  54 y.o. female  PRE-OPERATIVE DIAGNOSIS:  degenerative joint disease left hip  POST-OPERATIVE DIAGNOSIS:  degenerative joint disease left hip  PROCEDURE:  Procedure(s): TOTAL HIP ARTHROPLASTY ANTERIOR APPROACH (Left)  SURGEON:  Surgeon(s) and Role:    * Dorna Leitz, MD - Primary  PHYSICIAN ASSISTANT:   ASSISTANTS: bethune   ANESTHESIA:   spinal  EBL:  Total I/O In: 1100 [I.V.:1100] Out: 250 [Blood:250]  BLOOD ADMINISTERED:none  DRAINS: none   LOCAL MEDICATIONS USED:  OTHER experel  SPECIMEN:  No Specimen  DISPOSITION OF SPECIMEN:  N/A  COUNTS:  YES  TOURNIQUET:  * No tourniquets in log *  DICTATION: .Other Dictation: Dictation Number [id number]  PLAN OF CARE: Admit to inpatient   PATIENT DISPOSITION:  PACU - hemodynamically stable.   Delay start of Pharmacological VTE agent (>24hrs) due to surgical blood loss or risk of bleeding: no

## 2015-01-24 NOTE — Evaluation (Signed)
Physical Therapy Evaluation Patient Details Name: Dorothy Lynch MRN: [id number] DOB: [birth year] Today's Date: 01/24/2015   History of Present Illness  TOTAL HIP ARTHROPLASTY ANTERIOR APPROACH (Left)  Clinical Impression  Pt is s/p anterior THA resulting in the deficits listed below (see PT Problem List). Pt will benefit from skilled PT to increase their independence and safety with mobility to allow discharge to home with family assistance.       Follow Up Recommendations Home health PT    Equipment Recommendations  None recommended by PT (reports having rw at home)    Recommendations for Other Services       Precautions / Restrictions Precautions Precautions: Other (comment) (no hip precautions) Restrictions Weight Bearing Restrictions: Yes LLE Weight Bearing: Weight bearing as tolerated      Mobility  Bed Mobility Overal bed mobility: Needs Assistance Bed Mobility: Supine to Sit     Supine to sit: Supervision;HOB elevated        Transfers Overall transfer level: Needs assistance Equipment used: Rolling walker (2 wheeled) Transfers: Sit to/from Stand Sit to Stand: Min assist         General transfer comment: cues for hand placement  Ambulation/Gait Ambulation/Gait assistance: Min guard Ambulation Distance (Feet): 20 Feet Assistive device: Rolling walker (2 wheeled) Gait Pattern/deviations: Step-to pattern Gait velocity: decreased      Stairs            Wheelchair Mobility    Modified Rankin (Stroke Patients Only)       Balance Overall balance assessment: Needs assistance Sitting-balance support: No upper extremity supported Sitting balance-Leahy Scale: Good     Standing balance support: Bilateral upper extremity supported Standing balance-Leahy Scale: Poor Standing balance comment: using rw                             Pertinent Vitals/Pain Pain Assessment: 0-10 Pain Score: 3  Pain Location: Lt hip Pain Descriptors /  Indicators: Aching Pain Intervention(s): Monitored during session    Home Living Family/patient expects to be discharged to:: Private residence Living Arrangements: Spouse/significant other Available Help at Discharge: Family Type of Home: House Home Access: Stairs to enter Entrance Stairs-Rails: None Technical brewer of Steps: 3 Home Layout: Able to live on main level with bedroom/bathroom Home Equipment: Walker - 2 wheels      Prior Function Level of Independence: Independent               Hand Dominance        Extremity/Trunk Assessment               Lower Extremity Assessment: LLE deficits/detail   LLE Deficits / Details: fair quad activation on Lt     Communication   Communication: No difficulties  Cognition Arousal/Alertness: Awake/alert Behavior During Therapy: WFL for tasks assessed/performed Overall Cognitive Status: Within Functional Limits for tasks assessed                      General Comments      Exercises Total Joint Exercises Ankle Circles/Pumps: Both;AROM;10 reps Quad Sets: Left;Strengthening;10 reps Gluteal Sets: Strengthening;Both;10 reps Other Exercises Other Exercises: Extended time needed to review exercises. Patient with multiple questions during session. Family present and received education in addition to patient.       Assessment/Plan    PT Assessment Patient needs continued PT services  PT Diagnosis Difficulty walking   PT Problem List Decreased strength;Decreased range of motion;Decreased activity  tolerance;Decreased balance;Decreased mobility;Decreased knowledge of use of DME  PT Treatment Interventions DME instruction;Gait training;Stair training;Functional mobility training;Therapeutic activities;Therapeutic exercise;Balance training;Patient/family education   PT Goals (Current goals can be found in the Care Plan section) Acute Rehab PT Goals Patient Stated Goal: return home  PT Goal Formulation: With  patient Time For Goal Achievement: 02/07/15 Potential to Achieve Goals: Good    Frequency 7X/week   Barriers to discharge        Co-evaluation               End of Session Equipment Utilized During Treatment: Gait belt Activity Tolerance: No increased pain;Patient tolerated treatment well Patient left: in chair;with call bell/phone within reach;with family/visitor present Nurse Communication: Mobility status         Time: 2774-1287 PT Time Calculation (min) (ACUTE ONLY): 38 min   Charges:   PT Evaluation $Initial PT Evaluation Tier I: 1 Procedure PT Treatments $Gait Training: 8-22 mins $Therapeutic Exercise: 8-22 mins   PT G Codes:        Cassell Clement, PT, CSCS Pager [phone] Office [phone]  01/24/2015, 2:51 PM

## 2015-01-24 NOTE — Anesthesia Preprocedure Evaluation (Addendum)
Anesthesia Evaluation  Patient identified by MRN, date of birth, ID band Patient awake    Reviewed: Allergy & Precautions, H&P , NPO status , Patient's Chart, lab work & pertinent test results  History of Anesthesia Complications (+) PONV and history of anesthetic complications  Airway Mallampati: II  TM Distance: >3 FB Neck ROM: full    Dental no notable dental hx.    Pulmonary neg pulmonary ROS,  breath sounds clear to auscultation  Pulmonary exam normal       Cardiovascular negative cardio ROS Normal cardiovascular examRhythm:regular Rate:Normal     Neuro/Psych  Headaches, PSYCHIATRIC DISORDERS insomnia, anxiety on venlafaxine, ambien, lamictal, lexapro   GI/Hepatic negative GI ROS, Neg liver ROS,   Endo/Other  Hypothyroidism   Renal/GU negative Renal ROS     Musculoskeletal  (+) Arthritis -,   Abdominal   Peds  Hematology  (+) anemia ,   Anesthesia Other Findings   Reproductive/Obstetrics negative OB ROS                             Anesthesia Physical Anesthesia Plan  ASA: III  Anesthesia Plan: General   Post-op Pain Management:    Induction: Intravenous  Airway Management Planned: LMA  Additional Equipment:   Intra-op Plan:   Post-operative Plan: Extubation in OR  Informed Consent: I have reviewed the patients History and Physical, chart, labs and discussed the procedure including the risks, benefits and alternatives for the proposed anesthesia with the patient or authorized representative who has indicated his/her understanding and acceptance.     Plan Discussed with: Anesthesiologist, CRNA and Surgeon  Anesthesia Plan Comments:        Anesthesia Quick Evaluation

## 2015-01-24 NOTE — Discharge Instructions (Signed)

## 2015-01-24 NOTE — Anesthesia Postprocedure Evaluation (Signed)
  Anesthesia Post-op Note  Patient: Dorothy Lynch  Procedure(s) Performed: Procedure(s) (LRB): TOTAL HIP ARTHROPLASTY ANTERIOR APPROACH (Left)  Patient Location: PACU  Anesthesia Type: Spinal  Level of Consciousness: awake and alert   Airway and Oxygen Therapy: Patient Spontanous Breathing  Post-op Pain: mild  Post-op Assessment: Post-op Vital signs reviewed, Patient's Cardiovascular Status Stable, Respiratory Function Stable, Patent Airway and No signs of Nausea or vomiting  Last Vitals:  Filed Vitals:   01/24/15 1026  BP: 101/59  Pulse: 52  Temp:   Resp: 14    Post-op Vital Signs: stable   Complications: No apparent anesthesia complications

## 2015-01-24 NOTE — Anesthesia Procedure Notes (Addendum)
Procedure Name: MAC Date/Time: 01/24/2015 7:35 AM Performed by: Willeen Cass P Pre-anesthesia Checklist: Patient identified, Emergency Drugs available, Suction available, Timeout performed and Patient being monitored Patient Re-evaluated:Patient Re-evaluated prior to inductionOxygen Delivery Method: Nasal cannula Intubation Type: IV induction Ventilation: Mask ventilation without difficulty Dental Injury: Teeth and Oropharynx as per pre-operative assessment    Spinal Patient location during procedure: OR Staffing Anesthesiologist: Zyliah Schier Performed by: anesthesiologist  Preanesthetic Checklist Completed: patient identified, site marked, surgical consent, pre-op evaluation, timeout performed, IV checked, risks and benefits discussed and monitors and equipment checked Spinal Block Patient position: sitting Prep: DuraPrep Patient monitoring: heart rate, continuous pulse ox and blood pressure Approach: right paramedian Location: L4-5 Injection technique: single-shot Needle Needle type: Spinocan  Needle gauge: 24 G Needle length: 9 cm Additional Notes Functioning IV was confirmed and monitors were applied. Sterile prep and drape, including hand hygiene, mask and sterile gloves were used. The patient was positioned and the spine was prepped. The skin was anesthetized with lidocaine.  Free flow of clear CSF was obtained prior to injecting local anesthetic into the CSF.  The spinal needle aspirated freely following injection.  The needle was carefully withdrawn.  The patient tolerated the procedure well. Consent was obtained prior to procedure with all questions answered and concerns addressed.  [place] Pink, MD

## 2015-01-24 NOTE — Transfer of Care (Signed)
Immediate Anesthesia Transfer of Care Note  Patient: Dorothy Lynch  Procedure(s) Performed: Procedure(s): TOTAL HIP ARTHROPLASTY ANTERIOR APPROACH (Left)  Patient Location: PACU  Anesthesia Type:MAC  Level of Consciousness: awake, alert , oriented and patient cooperative  Airway & Oxygen Therapy: Patient Spontanous Breathing and Patient connected to nasal cannula oxygen  Post-op Assessment: Report given to RN and Post -op Vital signs reviewed and stable  Post vital signs: Reviewed and stable  Last Vitals:  Filed Vitals:   01/24/15 0602  BP: 105/48  Pulse: 55  Temp: 36.4 C  Resp: 20    Complications: No apparent anesthesia complications

## 2015-01-25 LAB — BASIC METABOLIC PANEL
Anion gap: 9 (ref 5–15)
BUN: 6 mg/dL (ref 6–20)
CALCIUM: 8.9 mg/dL (ref 8.9–10.3)
CO2: 26 mmol/L (ref 22–32)
CREATININE: 0.76 mg/dL (ref 0.44–1.00)
Chloride: 99 mmol/L — ABNORMAL LOW (ref 101–111)
GFR calc non Af Amer: >60 mL/min (ref >60)
Glucose, Bld: 147 mg/dL — ABNORMAL HIGH (ref 65–99)
Potassium: 4.6 mmol/L (ref 3.5–5.1)
SODIUM: 134 mmol/L — AB (ref 135–145)

## 2015-01-25 LAB — CBC
HCT: 31.2 % — ABNORMAL LOW (ref 36.0–46.0)
HEMOGLOBIN: 10.4 g/dL — AB (ref 12.0–15.0)
MCH: 31.7 pg (ref 26.0–34.0)
MCHC: 33.3 g/dL (ref 30.0–36.0)
MCV: 95.1 fL (ref 78.0–100.0)
PLATELETS: 194 10*3/uL (ref 150–400)
RBC: 3.28 MIL/uL — ABNORMAL LOW (ref 3.87–5.11)
RDW: 12.8 % (ref 11.5–15.5)
WBC: 8.7 10*3/uL (ref 4.0–10.5)

## 2015-01-25 NOTE — Op Note (Signed)
NAMEELFA, WOOTON                  ACCOUNT NO.:  [id number]  MEDICAL RECORD NO.:  [id number]  LOCATION:  [id number]                        FACILITY:  [facility]  PHYSICIAN:  Alta Corning, M.D.   DATE OF BIRTH:  [birth year]  DATE OF PROCEDURE:  01/24/2015 DATE OF DISCHARGE:                              OPERATIVE REPORT   PREOPERATIVE DIAGNOSIS:  End-stage degenerative joint disease, left hip.  POSTOPERATIVE DIAGNOSIS:  End-stage degenerative joint disease, left hip.  PRINCIPAL PROCEDURE:1.  Left total hip replacement with a Corail size 11 femur, 52-mm Pinnacle porous-coated cup with a 36 delta ceramic hip ball and a liner, which is a +4 neutral liner. 2.interpretation of multiple intraoperative fluoroscopic images  SURGEON:  Alta Corning, M.D.  ASSISTANT:  Gary Fleet, P.A.  ANESTHESIA:  General.  BRIEF HISTORY:  Ms. Kemmerer is a 54 year old female with a long history of significant complaints of left hip pain.  She had been treated conservatively for prolonged period of time, and after failure of all conservative care, she was taken to the operating room for left total hip replacement.  The patient was having night pain, light activity pain and was really having just a very difficult time getting around.  Once the decision was made for this, we talked about anterior versus posterior approach, and given her age, we felt that anterior approach was appropriate and this was chosen to be using preoperatively.  DESCRIPTION OF PROCEDURE:  The patient was taken to the operating room. After adequate anesthesia was obtained with general anesthetic, the patient was placed supine on the operating table.  The patient was then moved down to the Hana bed and boots were applied and she was positioned on the bed appropriately.  Preoperative imaging was taken at that point assessing the hip for leg length and rotation.  At that point, the hip was prepped and draped in usual sterile fashion.   Following this, an incision was made for an anterior approach to the hip, subcutaneous tissue down the level of the tensor fascia, which was clearly identified and divided, and lined with its fibers.  The muscle was then split allowing access to the anterior portion of the hip.  Retractors were put in place anterior and posterior to the neck and then the vessels were cauterized anteriorly.  Once this was completed, the releases were made and attention was then turned with retractors put in place to the hip capsule.  Hip capsule was opened and tagged and then at this point, the hip was externally rotated and hip skid was used to help release superiorly and inferiorly.  Then, the hip was put in a neutral position under fluoroscopic guidance.  So, provisional neck cut was made. Attention at this time was turned towards the acetabulum.  After the head was removed, the acetabulum was sequentially reamed to a level of 51 mm and a 52-mm porous-coated cup was put in 45 degrees of lateral opening and 20 degrees of anteversion.  Once this was done, attention was turned, retractors were put in place and the Hana bed was used to expose the proximal femur.  Proximal femur was exposed and the cookie cutter was  used followed by the chili pepper, followed by the rongeur and lateralization in the trochanteric area.  Once this was completed, we sequentially stemmed up to a level of 10 mm, pretty good fit with a 10.  We calcar planed, and felt like we could get an 11 and we went ahead with an 11 and we were able to get that down to an appropriate level.  She was dead center on her x-rays.  So, we felt like we were in the dead center of the stem, a nice cancellous bed for the stem.  Once this was put in place, the hip was reduced.  Initially, bit of a difficult reduction.  She did appear to be a little bit long, went back in with a 10.  Calcar plane to down about 5 mm.  I got the 11 down to this dead level  set, repeated the trial reduction, excellent reduction now was available and appropriate leg length and offset.  Once this was completed, the hip was irrigated copiously, then the hip was re- dislocated.  A size 11 Corail stem was put down, a delta ceramic +0 hip ball was placed and the hip was again reduced.  Final images were taken at this point.  Perfect symmetrical leg length and excellent fill of the implant were achieved and at this point, we felt that very nice total hip replacement.  The anterior capsule was repaired with 1 Vicryl running, the tensor fascia was then closed with 1 Vicryl running, skin with 0 and 2-0 Vicryl, and 3-0 Monocryl subcuticular.  Benzoin and Steri- Strips were applied.  Sterile compressive dressing was applied.  The patient was taken to the recovery room, she was noted to be in satisfactory condition.  Estimated blood loss for the procedure was approximately 500 mL.  Final estimates can be gotten from her anesthetic record.     Alta Corning, M.D.     Corliss Skains  D:  01/24/2015  T:  01/25/2015  Job:  [id number]

## 2015-01-25 NOTE — Evaluation (Signed)
Occupational Therapy Evaluation Patient Details Name: Dorothy Lynch MRN: [id number] DOB: 03-Mar-1961 Today's Date: 01/25/2015    History of Present Illness 54 y.o. s/p direct TOTAL HIP ARTHROPLASTY ANTERIOR APPROACH (Left)   Clinical Impression   Pt s/p above. Pt independent with ADLs, PTA. Plan to practice shower transfer tomorrow and review information. Do not feel pt will need follow up OT upon d/c.     Follow Up Recommendations  No OT follow up;Supervision - Intermittent    Equipment Recommendations  3 in 1 bedside comode    Recommendations for Other Services       Precautions / Restrictions Precautions Precautions: None Restrictions Weight Bearing Restrictions: Yes LLE Weight Bearing: Weight bearing as tolerated      Mobility Bed Mobility   General bed mobility comments: not assessed  Transfers Overall transfer level: Needs assistance Equipment used: Rolling walker (2 wheeled) Transfers: Sit to/from Stand Sit to Stand: Min assist         General transfer comment: assist to boost from chair. Cues for technique.    Balance    Used RW for ambulation and for sit to stand transfer.                                        ADL Overall ADL's : Needs assistance/impaired                     Lower Body Dressing: Sit to/from stand;Minimal assistance   Toilet Transfer: Minimal assistance;Supervision/safety;Ambulation;RW (Min assist-sit to stand; Supervision for ambulation)           Functional mobility during ADLs: Supervision/safety;Rolling walker General ADL Comments: Educated on LB dressing technique. Educated on AE. Educated on safety such as use of bag on walker and safe footwear. Briefly discussed shower transfer technique.  Pt practiced with sockaid.     Vision     Perception     Praxis      Pertinent Vitals/Pain Pain Assessment: 0-10 Pain Score: 3  Pain Location: LLE Pain Descriptors / Indicators: Sore Pain  Intervention(s): Monitored during session;Repositioned     Hand Dominance     Extremity/Trunk Assessment Upper Extremity Assessment Upper Extremity Assessment: Overall WFL for tasks assessed   Lower Extremity Assessment Lower Extremity Assessment: Defer to PT evaluation       Communication Communication Communication: No difficulties   Cognition Arousal/Alertness: Awake/alert Behavior During Therapy: WFL for tasks assessed/performed Overall Cognitive Status: Within Functional Limits for tasks assessed                     General Comments       Exercises       Shoulder Instructions      Home Living Family/patient expects to be discharged to:: Private residence Living Arrangements: Spouse/significant other Available Help at Discharge: Family Type of Home: House Home Access: Stairs to enter Technical brewer of Steps: 3 Entrance Stairs-Rails: None Home Layout: Able to live on main level with bedroom/bathroom     Bathroom Shower/Tub: Occupational psychologist: Standard     Home Equipment: Environmental consultant - 2 wheels          Prior Functioning/Environment Level of Independence: Independent             OT Diagnosis: Acute pain   OT Problem List: Decreased range of motion;Pain;Decreased knowledge of use of DME or AE;Decreased activity  tolerance   OT Treatment/Interventions: Self-care/ADL training;DME and/or AE instruction;Therapeutic activities;Patient/family education;Balance training    OT Goals(Current goals can be found in the care plan section) Acute Rehab OT Goals Patient Stated Goal: not stated OT Goal Formulation: With patient Time For Goal Achievement: 02/01/15 Potential to Achieve Goals: Good ADL Goals Pt Will Perform Lower Body Dressing: with set-up;sit to/from stand Pt Will Transfer to Toilet: with modified independence;ambulating (3 in 1 over commode) Pt Will Perform Tub/Shower Transfer: Shower transfer;with  supervision;ambulating;shower seat  OT Frequency: Min 2X/week   Barriers to D/C:            Co-evaluation              End of Session Equipment Utilized During Treatment: Gait belt;Rolling walker  Activity Tolerance: Patient tolerated treatment well Patient left: in chair;with call bell/phone within reach   Time: 9326-7124 OT Time Calculation (min): 17 min Charges:  OT General Charges $OT Visit: 1 Procedure OT Evaluation $Initial OT Evaluation Tier I: 1 Procedure G-CodesBenito Mccreedy OTR/L [id number] 01/25/2015, 11:43 AM

## 2015-01-25 NOTE — Progress Notes (Signed)
Subjective: L anterior THA 01-24-15 Sitting in chair, doing well, SCDs off, did well in PT, possible d/c tomorrow   Objective: Vital signs in last 24 hours: Temp:  [97.7 F (36.5 C)-97.8 F (36.6 C)] 97.7 F (36.5 C) (08/27 0451) Pulse Rate:  [54-60] 54 (08/27 0451) Resp:  [16] 16 (08/27 0451) BP: (99-102)/(36-50) 99/50 mmHg (08/27 0451) SpO2:  [97 %-98 %] 97 % (08/27 0451)  Intake/Output from previous day: 08/26 0701 - 08/27 0700 In: 1580 [P.O.:480; I.V.:1100] Out: 650 [Urine:400; Blood:250] Intake/Output this shift:     Recent Labs  01/25/15 0340  HGB 10.4*    Recent Labs  01/25/15 0340  WBC 8.7  RBC 3.28*  HCT 31.2*  PLT 194    Recent Labs  01/25/15 0340  NA 134*  K 4.6  CL 99*  CO2 26  BUN 6  CREATININE 0.76  GLUCOSE 147*  CALCIUM 8.9   No results for input(s): LABPT, INR in the last 72 hours.  Neurovascular intact Intact pulses distally Dorsiflexion/Plantar flexion intact Compartment soft dressing c/d/i  Assessment/Plan: Will try muscle relaxant today Continue pathway--possible d/c tomorrow.   Nishtha Raider A. 01/25/2015, 10:59 AM

## 2015-01-25 NOTE — Care Management Note (Signed)
Case Management Note  Patient Details  Name: Dorothy Lynch MRN: [id number] Date of Birth: [birth year]  Subjective/Objective:    54 yr old female s/p Left total hip arthroplasty.                Action/Plan: Case manager spoke with patient concerning DME needs. Patient states she has a rolling walker and a 3in1, requests a cane.    Expected Discharge Date:    01/26/15              Expected Discharge Plan:  [place]  In-House Referral:     Discharge planning Services  CM Consult  Post Acute Care Choice:  Home Health Choice offered to:     DME Arranged:   [name] DME Agency:   [facility]  HH Arranged:  PT [facility] Agency:  [place]  Status of Service:  Completed.   Medicare Important Message Given:    Date Medicare IM Given:    Medicare IM give by:    Date Additional Medicare IM Given:    Additional Medicare Important Message give by:     If discussed at [place] of Stay Meetings, dates discussed:    Additional Comments:  [name], RN 01/25/2015, 12:30 PM

## 2015-01-25 NOTE — Progress Notes (Signed)
Physical Therapy Treatment Patient Details Name: Dorothy Lynch MRN: [id number] DOB: 1961-03-16 Today's Date: 01/25/2015    History of Present Illness 54 y.o. s/p direct TOTAL HIP ARTHROPLASTY ANTERIOR APPROACH (Left)    PT Comments    Patient continues to be highly motivated and making great progress. Able to walk unit and practice steps this afternoon. Plan is to DC home tomorrow.   Follow Up Recommendations  Home health PT     Equipment Recommendations  None recommended by PT    Recommendations for Other Services       Precautions / Restrictions Precautions Precautions: None Restrictions Weight Bearing Restrictions: Yes LLE Weight Bearing: Weight bearing as tolerated    Mobility  Bed Mobility Overal bed mobility: Modified Independent             General bed mobility comments: not assessed  Transfers Overall transfer level: Needs assistance Equipment used: Rolling walker (2 wheeled) Transfers: Sit to/from Stand Sit to Stand: Supervision         General transfer comment: Cues for safe hand placement   Ambulation/Gait Ambulation/Gait assistance: Min guard Ambulation Distance (Feet): 600 Feet Assistive device: Rolling walker (2 wheeled) Gait Pattern/deviations: Step-through pattern;Decreased stride length Gait velocity: decreased Gait velocity interpretation: at or above normal speed for age/gender General Gait Details: Patient with safe use of RW. Wants to attempt cane tomorrow. Was not confident enough to try today   Stairs Stairs: Yes Stairs assistance: Min guard Stair Management: Step to pattern;Forwards;Two rails Number of Stairs: 5 General stair comments: Cues for sequence.   Wheelchair Mobility    Modified Rankin (Stroke Patients Only)       Balance                                    Cognition Arousal/Alertness: Awake/alert Behavior During Therapy: WFL for tasks assessed/performed Overall Cognitive Status: Within  Functional Limits for tasks assessed                      Exercises Total Joint Exercises Ankle Circles/Pumps: Both;AROM;10 reps Quad Sets: Left;Strengthening;10 reps Heel Slides: AAROM;Left;10 reps Long Arc Quad: AROM;Left;10 reps    General Comments        Pertinent Vitals/Pain Pain Assessment: 0-10 Pain Score: 3  Pain Location: LLE Pain Descriptors / Indicators: Aching;Sore Pain Intervention(s): Monitored during session;RN gave pain meds during session    [place] expects to be discharged to:: Private residence Living Arrangements: Spouse/significant other Available Help at Discharge: Family Type of Home: House Home Access: Stairs to enter Entrance Stairs-Rails: None Home Layout: Able to live on main level with bedroom/bathroom Home Equipment: Environmental consultant - 2 wheels      Prior Function Level of Independence: Independent          PT Goals (current goals can now be found in the care plan section) Acute Rehab PT Goals Patient Stated Goal: not stated Progress towards PT goals: Progressing toward goals    Frequency  7X/week    PT Plan Current plan remains appropriate    Co-evaluation             End of Session Equipment Utilized During Treatment: Gait belt Activity Tolerance: Patient tolerated treatment well Patient left: in chair;with call bell/phone within reach     Time: 1320-1341 PT Time Calculation (min) (ACUTE ONLY): 21 min  Charges:  $Gait Training: 8-22 mins $Therapeutic Exercise: 8-22 mins  G Codes:      Jacqualyn Posey 01/25/2015, 1:55 PM 01/25/2015 Jacqualyn Posey PTA [phone] pager [phone] office

## 2015-01-25 NOTE — Progress Notes (Signed)
Physical Therapy Treatment Patient Details Name: Dorothy Lynch MRN: [id number] DOB: December 15, 1960 Today's Date: 01/25/2015    History of Present Illness TOTAL HIP ARTHROPLASTY ANTERIOR APPROACH (Left)    PT Comments    Patient is progressing very well. The plan is for her to go home tomorrow morning. Will continue working on long hall ambulation and attempt steps later today  Follow Up Recommendations  Home health PT     Equipment Recommendations  None recommended by PT    Recommendations for Other Services       Precautions / Restrictions Precautions Precautions: None Restrictions LLE Weight Bearing: Weight bearing as tolerated    Mobility  Bed Mobility Overal bed mobility: Modified Independent                Transfers       Sit to Stand: Min guard         General transfer comment: cues for hand placement. Patient wants to pull up on RW  Ambulation/Gait Ambulation/Gait assistance: Min guard Ambulation Distance (Feet): 300 Feet Assistive device: Rolling walker (2 wheeled) Gait Pattern/deviations: Step-through pattern;Decreased stride length   Gait velocity interpretation: at or above normal speed for age/gender General Gait Details: Patient with safe use of RW.    Stairs            Wheelchair Mobility    Modified Rankin (Stroke Patients Only)       Balance                                    Cognition Arousal/Alertness: Awake/alert Behavior During Therapy: WFL for tasks assessed/performed Overall Cognitive Status: Within Functional Limits for tasks assessed                      Exercises Total Joint Exercises Ankle Circles/Pumps: Both;AROM;10 reps Quad Sets: Left;Strengthening;10 reps Heel Slides: AAROM;Left;10 reps Long Arc Quad: AROM;Left;10 reps    General Comments        Pertinent Vitals/Pain Pain Score: 3  Pain Location: Lt hip Pain Descriptors / Indicators: Sore Pain Intervention(s): Monitored  during session    Home Living                      Prior Function            PT Goals (current goals can now be found in the care plan section) Progress towards PT goals: Progressing toward goals    Frequency  7X/week    PT Plan Current plan remains appropriate    Co-evaluation             End of Session Equipment Utilized During Treatment: Gait belt Activity Tolerance: Patient tolerated treatment well Patient left: in chair;with call bell/phone within reach     Time: 1004-1029 PT Time Calculation (min) (ACUTE ONLY): 25 min  Charges:  $Gait Training: 8-22 mins $Therapeutic Exercise: 8-22 mins                    G Codes:      Jacqualyn Posey 01/25/2015, 10:41 AM  01/25/2015 Jacqualyn Posey PTA [phone] pager [phone] office

## 2015-01-26 LAB — CBC
HEMATOCRIT: 28.6 % — AB (ref 36.0–46.0)
HEMOGLOBIN: 9.6 g/dL — AB (ref 12.0–15.0)
MCH: 32.8 pg (ref 26.0–34.0)
MCHC: 33.6 g/dL (ref 30.0–36.0)
MCV: 97.6 fL (ref 78.0–100.0)
Platelets: 176 10*3/uL (ref 150–400)
RBC: 2.93 MIL/uL — ABNORMAL LOW (ref 3.87–5.11)
RDW: 12.9 % (ref 11.5–15.5)
WBC: 10.2 10*3/uL (ref 4.0–10.5)

## 2015-01-26 NOTE — Progress Notes (Signed)
Occupational Therapy Treatment Patient Details Name: Dorothy Lynch MRN: [id number] DOB: 19-Jan-1961 Today's Date: 01/26/2015    History of present illness 54 y.o. s/p direct TOTAL HIP ARTHROPLASTY ANTERIOR APPROACH (Left)   OT comments  Pt progressing. Education provided in session and feel pt is safe to d/c home, from OT standpoint.  Follow Up Recommendations  No OT follow up;Supervision - Intermittent    Equipment Recommendations  3 in 1 bedside comode    Recommendations for Other Services      Precautions / Restrictions Precautions Precautions: Fall Restrictions Weight Bearing Restrictions: Yes LLE Weight Bearing: Weight bearing as tolerated       Mobility Bed Mobility               General bed mobility comments: not assessed  Transfers Overall transfer level: Needs assistance Equipment used: Rolling walker (2 wheeled) Transfers: Sit to/from Stand Sit to Stand: Supervision;Modified independent (Device/Increase time)         General transfer comment: suggested another position for left leg to help with pain if needed. Recommended pt stand with walker in front of her.    Balance         Min guard-Mod assist for simulated shower transfer (less assist with RW)                  ADL Overall ADL's : Needs assistance/impaired                 Upper Body Dressing : Set up;Supervision/safety;Standing   Lower Body Dressing: Sit to/from stand;Minimal assistance (pt unable to fully doff left sock today- Pt donned panties)   Toilet Transfer: Supervision/safety;Ambulation;RW (chair)       Tub/ Shower Transfer: Walk-in shower;Min guard;Minimal assistance;Moderate assistance;Ambulation;Rolling walker (practiced with and without RW)   Functional mobility during ADLs: Supervision/safety;Rolling walker;Min guard;Minimal assistance;Moderate assistance (Min guard-Mod assist-shower transfer;otherwise supervision) General ADL Comments: Educated on safety.  Educated on LB dressing technique. Recommended someone be with her for shower transfer. OT mentioned AE but pt not interested.      Vision                     Perception     Praxis      Cognition   Awake/Alert  Behavior During Therapy: WFL for tasks assessed/performed Overall Cognitive Status: Within Functional Limits for tasks assessed                       Extremity/Trunk Assessment               Exercises     Shoulder Instructions       General Comments      Pertinent Vitals/ Pain       Pain Assessment: 0-10 Pain Score: 7  Pain Location: left hip Pain Descriptors / Indicators: Sore;Stabbing Pain Intervention(s): Monitored during session;Repositioned  Home Living                                          Prior Functioning/Environment              Frequency Min 2X/week     Progress Toward Goals  OT Goals(current goals can now be found in the care plan section)  Progress towards OT goals: Progressing toward goals  Acute Rehab OT Goals Patient Stated Goal: not stated OT Goal Formulation: With patient Time  For Goal Achievement: 02/01/15 Potential to Achieve Goals: Good ADL Goals Pt Will Perform Lower Body Dressing: with set-up;sit to/from stand Pt Will Transfer to Toilet: with modified independence;ambulating (3 in 1 over commode) Pt Will Perform Tub/Shower Transfer: Shower transfer;with supervision;ambulating;shower seat  Plan Discharge plan remains appropriate    Co-evaluation                 End of Session Equipment Utilized During Treatment: Gait belt;Rolling walker   Activity Tolerance Patient tolerated treatment well   Patient Left in chair;with call bell/phone within reach   Nurse Communication          Time: 9390-3009 OT Time Calculation (min): 20 min  Charges: OT General Charges $OT Visit: 1 Procedure OT Treatments $Self Care/Home Management : 8-22 mins  Benito Mccreedy  OTR/L [phone] 01/26/2015, 10:20 AM

## 2015-01-26 NOTE — Progress Notes (Signed)
PT Cancellation Note  Patient Details Name: Dorothy Lynch MRN: [id number] DOB: [birth year]   Cancelled Treatment:     Pt politely deferring 2nd PT session requesting to rest before d/cing home.  Pt states she has no questions/concerns & feels more comfortable with mobility & d/cing today after PT/OT session earlier this AM & speaking with MD.      Sarajane Marek, PTA [phone] 01/26/2015

## 2015-01-26 NOTE — Progress Notes (Signed)
Physical Therapy Treatment Patient Details Name: Dorothy Lynch MRN: [id number] DOB: [birth year] Today's Date: 01/26/2015    History of Present Illness 54 y.o. s/p direct TOTAL HIP ARTHROPLASTY ANTERIOR APPROACH (Left)    PT Comments    Pt reports she is experiencing increased pain today compared to yesterday however, she did not require increased assistance for any mobility throughout session.  Pt states she is hesitant about possible d/c home today due to increased pain.      Follow Up Recommendations  Home health PT     Equipment Recommendations  None recommended by PT    Recommendations for Other Services       Precautions / Restrictions Precautions Precautions: None Restrictions LLE Weight Bearing: Weight bearing as tolerated    Mobility  Bed Mobility                  Transfers Overall transfer level: Modified independent Equipment used: Rolling walker (2 wheeled)                Ambulation/Gait Ambulation/Gait assistance: Supervision Ambulation Distance (Feet): 300 Feet Assistive device: Rolling walker (2 wheeled) Gait Pattern/deviations: Decreased weight shift to left;Decreased stride length Gait velocity: decreased   General Gait Details: Pt reports increased pain in Lt hip today compared to previous session, states she is having to rely on UE's more.  Cues for increased heel strike LLE   Stairs            Wheelchair Mobility    Modified Rankin (Stroke Patients Only)       Balance           Standing balance support: No upper extremity supported Standing balance-Leahy Scale: Good                      Cognition Arousal/Alertness: Awake/alert Behavior During Therapy: WFL for tasks assessed/performed Overall Cognitive Status: Within Functional Limits for tasks assessed                      Exercises      General Comments        Pertinent Vitals/Pain Pain Score: 7  Pain Location: LLE with ambulation Pain  Descriptors / Indicators: Aching Pain Intervention(s): Monitored during session;Repositioned;Patient requesting pain meds-RN notified;RN gave pain meds during session    Home Living                      Prior Function            PT Goals (current goals can now be found in the care plan section) Acute Rehab PT Goals Patient Stated Goal: to go home PT Goal Formulation: With patient Time For Goal Achievement: 02/07/15 Potential to Achieve Goals: Good Progress towards PT goals: Progressing toward goals    Frequency  7X/week    PT Plan Current plan remains appropriate    Co-evaluation             End of Session   Activity Tolerance: Patient tolerated treatment well Patient left: in chair;with call bell/phone within reach     Time: 0830-0856 PT Time Calculation (min) (ACUTE ONLY): 26 min  Charges:  $Gait Training: 23-37 mins                    G Codes:      Sena Hitch 01/26/2015, 9:04 AM   Sarajane Marek, PTA [phone] 01/26/2015

## 2015-01-26 NOTE — Discharge Summary (Signed)
Physician Discharge Summary  Patient ID: Tamanika Heiney MRN: [id number] DOB/AGE: 1960/07/28 54 y.o.  Admit date: 01/24/2015 Discharge date: 01/26/2015  Admission Diagnoses:  Primary osteoarthritis of left hip  Discharge Diagnoses:  Principal Problem:   Primary osteoarthritis of left hip   Past Medical History  Diagnosis Date  . Allergy   . Hypothyroidism 07/2012  . Scoliosis   . PONV (postoperative nausea and vomiting)   . Migraines   . Headache(784.0)   . DDD (degenerative disc disease)   . Cancer     squamous cell skin cancer-shoulder x 3  . Anemia 07/2012  . Episodic low back pain   . Depression   . Anxiety   . Disease of vein   . History of bronchitis     Surgeries: Procedure(s): TOTAL HIP ARTHROPLASTY ANTERIOR APPROACH on 01/24/2015   Consultants (if any):    Discharged Condition: Improved  Hospital Course: Johni Narine is an 54 y.o. female who was admitted 01/24/2015 with a diagnosis of Primary osteoarthritis of left hip and went to the operating room on 01/24/2015 and underwent the above named procedures.    She was given perioperative antibiotics:  Anti-infectives    Start     Dose/Rate Route Frequency Ordered Stop   01/24/15 1200  ceFAZolin (ANCEF) IVPB 2 g/50 mL premix     2 g 100 mL/hr over 30 Minutes Intravenous Every 6 hours 01/24/15 1052 01/25/15 0011   01/24/15 0700  clindamycin (CLEOCIN) IVPB 900 mg     900 mg 100 mL/hr over 30 Minutes Intravenous To [facility] 01/23/15 1308 01/24/15 0726    .  She was given sequential compression devices, early ambulation, and ASA for DVT prophylaxis.  She benefited maximally from the hospital stay and there were no complications.  At the time of her d/c, she had cleared PT for d/c home, was NVI with a clean dressing externally, and with mild-to-moderate left thigh edema.  TED hose were prescribed.  No signficant calf edema.  She was tolerating PO diet.  Recent vital signs:  Filed Vitals:   01/26/15 0552  BP:  95/38  Pulse: 54  Temp: 98.2 F (36.8 C)  Resp: 16    Recent laboratory studies:  Lab Results  Component Value Date   HGB 9.6* 01/26/2015   HGB 10.4* 01/25/2015   HGB 12.7 01/15/2015   Lab Results  Component Value Date   WBC 10.2 01/26/2015   PLT 176 01/26/2015   Lab Results  Component Value Date   INR 0.91 01/15/2015   Lab Results  Component Value Date   NA 134* 01/25/2015   K 4.6 01/25/2015   CL 99* 01/25/2015   CO2 26 01/25/2015   BUN 6 01/25/2015   CREATININE 0.76 01/25/2015   GLUCOSE 147* 01/25/2015    Discharge Medications:     Medication List    TAKE these medications        aspirin EC 325 MG tablet  Take 1 tablet (325 mg total) by mouth 2 (two) times daily after a meal. Take x 1 month post op to decrease risk of blood clots.     methocarbamol 500 MG tablet  Commonly known as:  ROBAXIN  Take 1 tablet (500 mg total) by mouth every 8 (eight) hours as needed for muscle spasms.     oxyCODONE-acetaminophen 5-325 MG per tablet  Commonly known as:  PERCOCET/ROXICET  Take 1-2 tablets by mouth every 6 (six) hours as needed for severe pain.  ASK your doctor about these medications        ALEVE PO  Take 220 mg by mouth 4 (four) times daily.     ALPRAZolam 0.5 MG tablet  Commonly known as:  XANAX  as needed.     BIOTIN PO  Take 1 tablet by mouth daily.     cholecalciferol 1000 UNITS tablet  Commonly known as:  VITAMIN D  Take 1,000 Units by mouth daily.     escitalopram 10 MG tablet  Commonly known as:  LEXAPRO  Take 1 tablet (10 mg total) by mouth daily.     estradiol 1 MG tablet  Commonly known as:  ESTRACE  Take 3 tablets (3 mg total) by mouth every evening.     FISH OIL PO  Take 1 capsule by mouth daily.     GLUCOSAMINE PO  Take 1 tablet by mouth daily.     lamoTRIgine 100 MG tablet  Commonly known as:  LAMICTAL  TAKE ONE TABLET BY MOUTH TWICE DAILY     levothyroxine 75 MCG tablet  Commonly known as:  SYNTHROID, LEVOTHROID   TAKE 1 TABLET BY MOUTH EVERY DAY BEFORE BREAKFAST     OVER THE COUNTER MEDICATION  Apply 1 application topically as directed. Glucosamine cream     PRESERVISION AREDS PO  Take 1 tablet by mouth daily.     PROBIOTIC PO  Take 1 tablet by mouth daily.     progesterone 100 MG capsule  Commonly known as:  PROMETRIUM  Take 1 capsule (100 mg total) by mouth at bedtime.     SYSTANE OP  Place 1 drop into both eyes daily as needed (for dry eyes).     terconazole 0.8 % vaginal cream  Commonly known as:  TERAZOL 3  Place 1 applicator vaginally at bedtime.     tretinoin 0.05 % cream  Commonly known as:  RETIN-A  Apply topically at bedtime.     venlafaxine XR 37.5 MG 24 hr capsule  Commonly known as:  EFFEXOR-XR  TAKE 1 CAPSULE by mouth EVERY DAY     zolpidem 10 MG tablet  Commonly known as:  AMBIEN  TAKE ONE TABLET BY MOUTH AT BEDTIME AS NEEDED FOR SLEEP        Diagnostic Studies: Dg Chest 2 View  01/15/2015   CLINICAL DATA:  Left hip replacement.  Preoperative exam .  EXAM: CHEST  2 VIEW  COMPARISON:  None.  FINDINGS: Mediastinum hilar structures normal. Lungs are clear. Heart size normal. No pleural effusion or pneumothorax. Biapical pleural parenchymal thickening noted most consistent with scarring. Prominent thoracolumbar spine scoliosis. No acute abnormality .  IMPRESSION: 1. No acute cardiopulmonary disease. Biapical pleural parenchymal thickening noted most consistent with scarring. 2. Prominent thoracolumbar spine scoliosis.   Electronically Signed   By: Marcello Moores  Register   On: 01/15/2015 16:55   Dg Hip Operative Unilat With Pelvis Left  01/24/2015   CLINICAL DATA:  Left hip replacement.  EXAM: OPERATIVE LEFT HIP (WITH PELVIS IF PERFORMED)  VIEWS  TECHNIQUE: Fluoroscopic spot image(s) were submitted for interpretation post-operatively.  COMPARISON:  None.  FINDINGS: Total left hip replacement with good anatomic alignment. No acute abnormality identified. Hardware intact.   IMPRESSION: Total left hip replacement with good anatomic alignment.   Electronically Signed   By: Marcello Moores  Register   On: 01/24/2015 09:26    Disposition: Final discharge disposition not confirmed      Discharge Instructions    Weight bearing as tolerated  Complete by:  As directed   Extremity:  Lower           Follow-up Information    Follow up with GRAVES,JOHN L, MD. Schedule an appointment as soon as possible for a visit in 2 weeks.   Specialty:  Orthopedic Surgery   Contact information:   [place] [address] [phone]       Follow up with [facility].   Why:  Someone from [facility] will contact you concerning start date for therapy.   Contact information:   [place] [address] [phone]        Signed: Grandville Silos, Darilyn Storbeck A. 01/26/2015, 9:36 AM

## 2015-01-27 ENCOUNTER — Encounter (HOSPITAL_COMMUNITY): Payer: Self-pay | Admitting: Orthopedic Surgery

## 2015-02-14 ENCOUNTER — Other Ambulatory Visit: Payer: Self-pay

## 2015-02-14 DIAGNOSIS — B3731 Acute candidiasis of vulva and vagina: Secondary | ICD-10-CM

## 2015-02-14 DIAGNOSIS — B373 Candidiasis of vulva and vagina: Secondary | ICD-10-CM

## 2015-02-28 ENCOUNTER — Telehealth: Payer: Self-pay | Admitting: *Deleted

## 2015-02-28 ENCOUNTER — Other Ambulatory Visit: Payer: Self-pay | Admitting: Gynecology

## 2015-02-28 MED ORDER — TERCONAZOLE 0.4 % VA CREA: 1.0000 | TOPICAL_CREAM | Freq: Every day | VAGINAL | Status: DC

## 2015-02-28 NOTE — Telephone Encounter (Signed)
Pt called requesting refill on Terazol 7 day cream having had time getting rid of yeast infection. See on /24/16 for this,had hip replacement on 01/24/15. Please advise

## 2015-02-28 NOTE — Telephone Encounter (Signed)
Okay for Terazol 7 with 1 refill. Tell her we hope she is doing well from her surgery.

## 2015-02-28 NOTE — Telephone Encounter (Signed)
Pt aware Rx sent.  

## 2015-04-01 ENCOUNTER — Other Ambulatory Visit: Payer: Self-pay | Admitting: *Deleted

## 2015-04-03 ENCOUNTER — Other Ambulatory Visit: Payer: Self-pay

## 2015-04-03 ENCOUNTER — Other Ambulatory Visit: Payer: Self-pay | Admitting: Gynecology

## 2015-04-03 NOTE — Telephone Encounter (Signed)
Patient called about getting refill on medication. It sounded like she was saying Fexafenadine.  I left message for her to call me to clarify. I suspect she might be referring to her Effexor but I do not want to assume.

## 2015-04-05 ENCOUNTER — Other Ambulatory Visit: Payer: Self-pay | Admitting: Family Medicine

## 2015-04-22 ENCOUNTER — Other Ambulatory Visit: Payer: Self-pay | Admitting: Gynecology

## 2015-04-28 ENCOUNTER — Other Ambulatory Visit: Payer: Self-pay | Admitting: Gynecology

## 2015-04-30 ENCOUNTER — Ambulatory Visit (INDEPENDENT_AMBULATORY_CARE_PROVIDER_SITE_OTHER): Payer: PRIVATE HEALTH INSURANCE | Admitting: Women's Health

## 2015-04-30 VITALS — BP 115/74

## 2015-04-30 DIAGNOSIS — B373 Candidiasis of vulva and vagina: Secondary | ICD-10-CM | POA: Diagnosis not present

## 2015-04-30 DIAGNOSIS — R350 Frequency of micturition: Secondary | ICD-10-CM | POA: Diagnosis not present

## 2015-04-30 DIAGNOSIS — B3731 Acute candidiasis of vulva and vagina: Secondary | ICD-10-CM

## 2015-04-30 LAB — URINALYSIS W MICROSCOPIC + REFLEX CULTURE
BACTERIA UA: NONE SEEN [HPF]
BILIRUBIN URINE: NEGATIVE
Casts: NONE SEEN [LPF]
Crystals: NONE SEEN [HPF]
Glucose, UA: NEGATIVE
Hgb urine dipstick: NEGATIVE
KETONES UR: NEGATIVE
LEUKOCYTES UA: NEGATIVE
Nitrite: NEGATIVE
PH: 6 (ref 5.0–8.0)
Protein, ur: NEGATIVE
RBC / HPF: NONE SEEN RBC/HPF (ref <=2)
Specific Gravity, Urine: <1.005 (ref 1.001–1.035)
WBC, UA: NONE SEEN WBC/HPF (ref <=5)
YEAST: NONE SEEN [HPF]

## 2015-04-30 LAB — WET PREP FOR TRICH, YEAST, CLUE
Clue Cells Wet Prep HPF POC: NONE SEEN
Trich, Wet Prep: NONE SEEN
Yeast Wet Prep HPF POC: NONE SEEN

## 2015-04-30 MED ORDER — FEXOFENADINE HCL 180 MG PO TABS: 180.0000 mg | ORAL_TABLET | Freq: Every day | ORAL | Status: DC

## 2015-04-30 MED ORDER — FLUCONAZOLE 100 MG PO TABS: ORAL_TABLET | ORAL | Status: DC

## 2015-04-30 NOTE — Addendum Note (Signed)
Addended by: Thamas Jaegers on: 04/30/2015 02:21 PM   Modules accepted: Orders

## 2015-04-30 NOTE — Progress Notes (Signed)
Patient ID: Dorothy Lynch, female   DOB: August 28, 1960, 54 y.o.   MRN: [id number] Presents with complaint of vaginal itching, irritation, burning vaginally and discomfort with intercourse for 2 weeks. Has used Terazol and Diflucan with some relief. Postmenopausal with no bleeding on HRT. Mild urinary frequency without pain at end of stream. Denies abdominal pain or fever.  Exam: Appears well. External genitalia erythematous at introitus, speculum exam scant discharge wet prep negative. UA: Negative  Vaginal atrophy with questionable unresolved yeast.  Plan: Diflucan 100 by mouth daily every other day for 3 doses. Prescription, proper use given and reviewed. Vaginal lubricants with intercourse. Reviewed possibly using a vaginal estrogen after symptoms resolve. Instructed to call if symptoms persist.

## 2015-05-12 ENCOUNTER — Encounter: Payer: Self-pay | Admitting: Gynecology

## 2015-05-27 ENCOUNTER — Other Ambulatory Visit: Payer: Self-pay | Admitting: Gynecology

## 2015-05-27 NOTE — Telephone Encounter (Signed)
Called into pharmacy

## 2015-08-04 ENCOUNTER — Other Ambulatory Visit: Payer: Self-pay | Admitting: Gynecology

## 2015-08-11 ENCOUNTER — Other Ambulatory Visit: Payer: Self-pay | Admitting: *Deleted

## 2015-08-11 DIAGNOSIS — E038 Other specified hypothyroidism: Secondary | ICD-10-CM

## 2015-08-11 MED ORDER — LEVOTHYROXINE SODIUM 75 MCG PO TABS: ORAL_TABLET | ORAL | Status: DC

## 2015-08-18 ENCOUNTER — Ambulatory Visit (INDEPENDENT_AMBULATORY_CARE_PROVIDER_SITE_OTHER): Payer: PRIVATE HEALTH INSURANCE | Admitting: Family Medicine

## 2015-08-18 ENCOUNTER — Encounter: Payer: Self-pay | Admitting: Family Medicine

## 2015-08-18 VITALS — BP 110/70 | HR 77 | Temp 97.6°F | Wt 157.0 lb

## 2015-08-18 DIAGNOSIS — R05 Cough: Secondary | ICD-10-CM

## 2015-08-18 DIAGNOSIS — J111 Influenza due to unidentified influenza virus with other respiratory manifestations: Secondary | ICD-10-CM

## 2015-08-18 DIAGNOSIS — R059 Cough, unspecified: Secondary | ICD-10-CM

## 2015-08-18 LAB — POCT INFLUENZA A: Rapid Influenza A Ag: POSITIVE

## 2015-08-18 MED ORDER — GUAIFENESIN-CODEINE 100-10 MG/5ML PO SOLN: 5.0000 mL | Freq: Four times a day (QID) | ORAL | Status: DC | PRN

## 2015-08-18 NOTE — Progress Notes (Signed)
PCP: No primary care provider on file. will establish with me  Subjective:  Dorothy Lynch is a 55 y.o. year old very pleasant female patient who presents with flu like symptoms including body aches, severe fatigue -other symptoms: Body aches started on Wednesday of last week but no fever at first, felt very lethargic. Coughing fits. Ears hurt and has headache. Nasal congestion with dark phlegm that feels like it is coming from chest. Has been taking aleve whole time. Did not get flu shot this year  -inside 48 hour treatment window if needed for tamiflu: no -high risk condition (children <5, adults >65, chronic pulmonary or cardiac condition, immunosuppression, pregnancy, nursing home resident, morbid obesity) : no -symptoms are improving -previous treatments: acetaminophen, ibuprofen - denies sick contact; specifically influenza: no  ROS-denies SOB, mild nausea, no vomiting, diarrhea, sinus or dental pain  Pertinent Past Medical History-  Patient Active Problem List   Diagnosis Date Noted  . Primary osteoarthritis of left hip 01/24/2015  . Cancer ([place]) skin   . Mood disorder ([place]) 01/18/2011  . Insomnia 01/18/2011    Medications- reviewed  Current Outpatient Prescriptions  Medication Sig Dispense Refill  . BIOTIN PO Take 1 tablet by mouth daily.     . cholecalciferol (VITAMIN D) 1000 UNITS tablet Take 1,000 Units by mouth daily.    Marland Kitchen escitalopram (LEXAPRO) 10 MG tablet TAKE 1 TABLET BY MOUTH EVERY DAY 30 tablet 0  . estradiol (ESTRACE) 1 MG tablet TAKE 3 TABLETS BY MOUTH EVERY EVENING 90 tablet 0  . fexofenadine (ALLEGRA) 180 MG tablet Take 1 tablet (180 mg total) by mouth daily. 30 tablet 6  . Glucosamine HCl (GLUCOSAMINE PO) Take 1 tablet by mouth daily.    Marland Kitchen lamoTRIgine (LAMICTAL) 100 MG tablet TAKE 1 TABLET BY MOUTH 2 TIMES DAILY 60 tablet 5  . levothyroxine (SYNTHROID, LEVOTHROID) 75 MCG tablet TAKE 1 TABLET BY MOUTH EVERY DAY BEFORE BREAKFAST 90 tablet 0  . Naproxen Sodium (ALEVE  PO) Take 220 mg by mouth 4 (four) times daily.     Marland Kitchen OVER THE COUNTER MEDICATION Apply 1 application topically as directed. Glucosamine cream    . Polyethyl Glycol-Propyl Glycol (SYSTANE OP) Place 1 drop into both eyes daily as needed (for dry eyes).    . Probiotic Product (PROBIOTIC PO) Take 1 tablet by mouth daily.     . progesterone (PROMETRIUM) 100 MG capsule TAKE 1 CAPSULE BY MOUTH AT BEDTIME 30 capsule 0  . terconazole (TERAZOL 7) 0.4 % vaginal cream Place 1 applicator vaginally at bedtime. 45 g 0  . tretinoin (RETIN-A) 0.05 % cream Apply topically at bedtime. 45 g 1  . venlafaxine XR (EFFEXOR-XR) 37.5 MG 24 hr capsule TAKE 1 CAPSULE by mouth EVERY DAY 30 capsule 12  . fluconazole (DIFLUCAN) 100 MG tablet Take one tablet every other day for 3 doses (Patient not taking: Reported on 08/18/2015) 20 tablet 0  . Omega-3 Fatty Acids (FISH OIL PO) Take 1 capsule by mouth daily.     Marland Kitchen zolpidem (AMBIEN) 10 MG tablet TAKE 1 TABLET BY MOUTH AT BEDTIME AS NEEDED FOR SLEEP (Patient not taking: Reported on 08/18/2015) 90 tablet 1  . [DISCONTINUED] fluticasone (VERAMYST) 27.5 MCG/SPRAY nasal spray Place 2 sprays into the nose daily.       No current facility-administered medications for this visit.    Objective: BP 110/70 mmHg  Pulse 77  Temp(Src) 97.6 F (36.4 C)  Wt 157 lb (71.215 kg)  LMP 09/28/2008 Gen: NAD, appears fatigued  HEENT: Turbinates erythematous, TM normal, pharynx mildly erythematous with no tonsilar exudate or edema, no sinus tenderness CV: RRR no murmurs rubs or gallops Lungs: CTAB no crackles, wheeze, rhonchi Abdomen: soft/nontender/nondistended/normal bowel sounds. Ext: no edema Skin: warm, dry, no rash  Results for orders placed or performed in visit on 08/18/15 (from the past 24 hour(s))  POCT Influenza A     Status: None   Collection Time: 08/18/15 11:41 AM  Result Value Ref Range   Rapid Influenza A Ag Positive     Assessment/Plan:  Flu-like illness: Influenza   History and exam today are suggestive of viral process. Patients influenza test was positive.  Pretest probability of influenza was moderate.   Patient will not be treated with Tamiflu. Prophylaxis for other [place] patients: yes husband- sent to [facility] Symptomatic treatment with: codeine cough syrup (do not take if will be driving within 8 hours)  Finally, we reviewed reasons to return to care including if symptoms worsen or persist (into next week) or new concerns arise (including new fever or shortness of breath)  Meds ordered this encounter  Medications  . guaiFENesin-codeine 100-10 MG/5ML syrup    Sig: Take 5 mLs by mouth every 6 (six) hours as needed for cough.    Dispense:  120 mL    Refill:  0

## 2015-08-18 NOTE — Patient Instructions (Signed)
Influenza  (flu)  Patients influenza test was positive.  Pretest probability of influenza was moderate.   Patient will not be treated with Tamiflu as outside of window of 48 hours and no chronic lung or cardiac disease Prophylaxis for other [place] patients: yes husband- sent to [facility] Symptomatic treatment with: codeine cough syrup (do not take if will be driving within 8 hours)  Finally, we reviewed reasons to return to care including if symptoms worsen or persist (into next week) or new concerns arise (including new fever or shortness of breath)  Meds ordered this encounter  Medications  . guaiFENesin-codeine 100-10 MG/5ML syrup    Sig: Take 5 mLs by mouth every 6 (six) hours as needed for cough.    Dispense:  120 mL    Refill:  0

## 2015-08-21 ENCOUNTER — Telehealth: Payer: Self-pay | Admitting: Family Medicine

## 2015-08-21 MED ORDER — BENZONATATE 100 MG PO CAPS: 100.0000 mg | ORAL_CAPSULE | Freq: Two times a day (BID) | ORAL | Status: DC | PRN

## 2015-08-21 NOTE — Telephone Encounter (Signed)
Please see message and advise 

## 2015-08-21 NOTE — Telephone Encounter (Signed)
Spoke to pt, asked her if she is having fever or SOB? Pt said no, just can't take a deep breath. Told pt okay Dr.Hunter sent Rx to pharmacy for Tessalon capsules to help with cough. He said there is a stronger medication with codeine but rather potent so would like to re-evaluate before prescribing that. Pt said he did give her codeine cough syrup and it is not helping at all keeps her awake. Told pt okay try Tessalon capsules and if symptoms not improved by Monday Dr.Hunter would like to see you. Pt verbalized understanding.

## 2015-08-21 NOTE — Telephone Encounter (Signed)
Pt call to say she saw Dr Yong Channel on Monday 08/18/15. She said she still has a deep cough,wheezing tightness in her ears, she said the cough syrup is not not touching her cough. Does she need to come back in or will you call her something in    Pharmacy ; [facility]

## 2015-08-21 NOTE — Telephone Encounter (Signed)
Sent in some tessalon perles for her to trial instead. Codeine cough syrup is a rather potent cough medicine- there is a stronger medicine but would likely want to reevaluate her before prescribing. Is she having any fever? Does she feel short of breath- if either of those- we could send her for a chest x-ray to make sure no pneumonia. I would say if she doesn't have those things to see me on Monday if not improving as symptoms should be improving at least within a week of flu (though not necessarily gone)

## 2015-08-25 ENCOUNTER — Other Ambulatory Visit: Payer: Self-pay | Admitting: Gynecology

## 2015-09-29 ENCOUNTER — Other Ambulatory Visit: Payer: Self-pay | Admitting: Gynecology

## 2015-09-29 ENCOUNTER — Other Ambulatory Visit: Payer: Self-pay | Admitting: Family Medicine

## 2015-09-29 NOTE — Telephone Encounter (Signed)
Pt will be contacted to schedule annual.

## 2015-10-13 ENCOUNTER — Other Ambulatory Visit: Payer: Self-pay | Admitting: Gynecology

## 2015-10-14 ENCOUNTER — Ambulatory Visit (INDEPENDENT_AMBULATORY_CARE_PROVIDER_SITE_OTHER): Payer: PRIVATE HEALTH INSURANCE | Admitting: Family Medicine

## 2015-10-14 ENCOUNTER — Encounter: Payer: Self-pay | Admitting: Family Medicine

## 2015-10-14 ENCOUNTER — Other Ambulatory Visit: Payer: Self-pay | Admitting: Family Medicine

## 2015-10-14 VITALS — BP 100/70 | HR 68 | Temp 98.2°F | Ht 68.0 in | Wt 155.0 lb

## 2015-10-14 DIAGNOSIS — F3181 Bipolar II disorder: Secondary | ICD-10-CM

## 2015-10-14 DIAGNOSIS — E039 Hypothyroidism, unspecified: Secondary | ICD-10-CM | POA: Diagnosis not present

## 2015-10-14 DIAGNOSIS — N951 Menopausal and female climacteric states: Secondary | ICD-10-CM

## 2015-10-14 DIAGNOSIS — J309 Allergic rhinitis, unspecified: Secondary | ICD-10-CM | POA: Insufficient documentation

## 2015-10-14 DIAGNOSIS — Z23 Encounter for immunization: Secondary | ICD-10-CM | POA: Diagnosis not present

## 2015-10-14 DIAGNOSIS — M1612 Unilateral primary osteoarthritis, left hip: Secondary | ICD-10-CM

## 2015-10-14 DIAGNOSIS — G4700 Insomnia, unspecified: Secondary | ICD-10-CM

## 2015-10-14 DIAGNOSIS — Z85828 Personal history of other malignant neoplasm of skin: Secondary | ICD-10-CM

## 2015-10-14 DIAGNOSIS — B379 Candidiasis, unspecified: Secondary | ICD-10-CM | POA: Diagnosis not present

## 2015-10-14 DIAGNOSIS — J302 Other seasonal allergic rhinitis: Secondary | ICD-10-CM

## 2015-10-14 DIAGNOSIS — Z7289 Other problems related to lifestyle: Secondary | ICD-10-CM

## 2015-10-14 LAB — TSH: TSH: 1.08 u[IU]/mL (ref 0.35–4.50)

## 2015-10-14 LAB — CBC
HCT: 37.6 % (ref 36.0–46.0)
Hemoglobin: 12.8 g/dL (ref 12.0–15.0)
MCHC: 33.9 g/dL (ref 30.0–36.0)
MCV: 94.3 fl (ref 78.0–100.0)
Platelets: 271 10*3/uL (ref 150.0–400.0)
RBC: 3.99 Mil/uL (ref 3.87–5.11)
RDW: 13.6 % (ref 11.5–15.5)
WBC: 8.6 10*3/uL (ref 4.0–10.5)

## 2015-10-14 LAB — COMPREHENSIVE METABOLIC PANEL
ALK PHOS: 48 U/L (ref 39–117)
ALT: 15 U/L (ref 0–35)
AST: 16 U/L (ref 0–37)
Albumin: 4.2 g/dL (ref 3.5–5.2)
BILIRUBIN TOTAL: 0.4 mg/dL (ref 0.2–1.2)
BUN: 14 mg/dL (ref 6–23)
CO2: 27 meq/L (ref 19–32)
Calcium: 9.4 mg/dL (ref 8.4–10.5)
Chloride: 101 mEq/L (ref 96–112)
Creatinine, Ser: 1.05 mg/dL (ref 0.40–1.20)
GFR: 57.55 mL/min — ABNORMAL LOW (ref >60.00)
GLUCOSE: 104 mg/dL — AB (ref 70–99)
POTASSIUM: 4.1 meq/L (ref 3.5–5.1)
SODIUM: 136 meq/L (ref 135–145)
TOTAL PROTEIN: 6.6 g/dL (ref 6.0–8.3)

## 2015-10-14 MED ORDER — LAMOTRIGINE 100 MG PO TABS
100.0000 mg | ORAL_TABLET | Freq: Two times a day (BID) | ORAL | Status: DC
Start: 2015-10-14 — End: 2016-07-10

## 2015-10-14 MED ORDER — ESCITALOPRAM OXALATE 10 MG PO TABS: 10.0000 mg | ORAL_TABLET | Freq: Every day | ORAL | Status: DC

## 2015-10-14 MED ORDER — TERCONAZOLE 0.4 % VA CREA: TOPICAL_CREAM | VAGINAL | Status: DC

## 2015-10-14 MED ORDER — VENLAFAXINE HCL ER 37.5 MG PO CP24: ORAL_CAPSULE | ORAL | Status: DC

## 2015-10-14 NOTE — Assessment & Plan Note (Signed)
S:Diagnosed with Bipolar II in past. Saw psychiatry for long term then transitioned to primary care in combination with Dr. Leanne Chang and psychiatrist per patient due to long term stability. Last seen around 2013 by psychiatry. Has been stable since tha ttime on Lexapro 10mg . Effexor 37.5mg  extended release. Lamictal 100mg  twice a day. Always on both SSRI in recent memory. Had previously seen Dr. Toy Care but transferred care after an adverse reaction to a medication (she is going to contact me with that information)  She has had no hypomanic or depressive phases in last few years. No Si/HI. Doing very well.  A/P: refilled medications. Patient requests year follow up but with atypical regimen and regimen often managed by psychiatry I encouraged 6 month follow up.

## 2015-10-14 NOTE — Patient Instructions (Addendum)
Labs before you leave  Tdap before you leave  No changes in medication. I would really love to get records from your last psychiatry visit. Sign release of information at the check out desk when you find this information out.   When you find medication you were on previously with Dr. Toy Care please send me a mychart message  We have ordered labs or studies at this visit. It can take up to 1-2 weeks for results and processing. IF results require follow up or explanation, we will call you with instructions. Clinically stable results will be released to your [facility]. If you have not heard from [place] or cannot find your results in [facility] in 2 weeks please contact our office at [phone].  If you are not yet signed up for [facility], please consider signing up

## 2015-10-14 NOTE — Assessment & Plan Note (Signed)
S:ambien 10mg - cuts in half and still wakes up several times a night so unfortunately imperfect control- stays up more if does not take (does not feel rested as would be expected with hypomania that is to feel rested despite poor sleep) A/P: continue ambien 5mg  alone as age based limit

## 2015-10-14 NOTE — Progress Notes (Signed)
Subjective:  Dorothy Lynch is a 55 y.o. year old very pleasant female patient who presents for/with See problem oriented charting ROS- no SI/HI. No depression. Does have insomnia.Marland Kitchensee any ROS included in HPI as well.   Past Medical History-  Patient Active Problem List   Diagnosis Date Noted  . Bipolar II disorder ([place]) 01/18/2011    Priority: High  . Menopausal symptoms 10/14/2015    Priority: Medium  . Hypothyroidism 10/14/2015    Priority: Medium  . Insomnia 01/18/2011    Priority: Medium  . Allergic rhinitis 10/14/2015    Priority: Low  . Yeast infection 10/14/2015    Priority: Low  . Primary osteoarthritis of left hip 01/24/2015    Priority: Low  . History of squamous cell carcinoma of skin     Priority: Low    Medications- reviewed and updated Current Outpatient Prescriptions  Medication Sig Dispense Refill  . BIOTIN PO Take 1 tablet by mouth daily.     . cholecalciferol (VITAMIN D) 1000 UNITS tablet Take 1,000 Units by mouth daily.    Marland Kitchen escitalopram (LEXAPRO) 10 MG tablet Take 1 tablet (10 mg total) by mouth daily. 90 tablet 3  . estradiol (ESTRACE) 1 MG tablet TAKE 3 TABLETS BY MOUTH EVERY EVENING 30 tablet 0  . fexofenadine (ALLEGRA) 180 MG tablet Take 1 tablet (180 mg total) by mouth daily. 30 tablet 6  . fluconazole (DIFLUCAN) 100 MG tablet Take one tablet every other day for 3 doses 20 tablet 0  . Glucosamine HCl (GLUCOSAMINE PO) Take 1 tablet by mouth daily.    Marland Kitchen lamoTRIgine (LAMICTAL) 100 MG tablet Take 1 tablet (100 mg total) by mouth 2 (two) times daily. 180 tablet 3  . levothyroxine (SYNTHROID, LEVOTHROID) 75 MCG tablet TAKE 1 TABLET BY MOUTH EVERY DAY BEFORE BREAKFAST 90 tablet 0  . Omega-3 Fatty Acids (FISH OIL PO) Take 1 capsule by mouth daily.     Marland Kitchen OVER THE COUNTER MEDICATION Apply 1 application topically as directed. Glucosamine cream    . Polyethyl Glycol-Propyl Glycol (SYSTANE OP) Place 1 drop into both eyes daily as needed (for dry eyes).    . Probiotic  Product (PROBIOTIC PO) Take 1 tablet by mouth daily.     . progesterone (PROMETRIUM) 100 MG capsule TAKE 1 CAPSULE BY MOUTH AT BEDTIME 30 capsule 0  . terconazole (TERAZOL 7) 0.4 % vaginal cream Place 1 applicator vaginally at bedtime for 7 days 45 g 2  . tretinoin (RETIN-A) 0.05 % cream Apply topically at bedtime. 45 g 1  . venlafaxine XR (EFFEXOR-XR) 37.5 MG 24 hr capsule TAKE 1 CAPSULE by mouth EVERY DAY 90 capsule 3  . zolpidem (AMBIEN) 10 MG tablet TAKE 1 TABLET BY MOUTH AT BEDTIME AS NEEDED FOR SLEEP 90 tablet 1  . [DISCONTINUED] fluticasone (VERAMYST) 27.5 MCG/SPRAY nasal spray Place 2 sprays into the nose daily.       No current facility-administered medications for this visit.    Objective: BP 100/70 mmHg  Pulse 68  Temp(Src) 98.2 F (36.8 C) (Oral)  Ht 5\' 8"  (1.727 m)  Wt 155 lb (70.308 kg)  BMI 23.57 kg/m2  LMP 09/28/2008 Gen: NAD, resting comfortably TM normal, no thyromegaly CV: RRR no murmurs rubs or gallops Lungs: CTAB no crackles, wheeze, rhonchi Abdomen: soft/nontender/nondistended/normal bowel sounds.  Ext: no edema Skin: warm, dry Neuro: grossly normal, moves all extremities  Assessment/Plan:  Bipolar II disorder ([place]) S:Diagnosed with Bipolar II in past. Saw psychiatry for long term then transitioned to  primary care in combination with Dr. Leanne Chang and psychiatrist per patient due to long term stability. Last seen around 2013 by psychiatry. Has been stable since tha ttime on Lexapro 10mg . Effexor 37.5mg  extended release. Lamictal 100mg  twice a day. Always on both SSRI in recent memory. Had previously seen Dr. Toy Care but transferred care after an adverse reaction to a medication (she is going to contact me with that information)  She has had no hypomanic or depressive phases in last few years. No Si/HI. Doing very well.  A/P: refilled medications. Patient requests year follow up but with atypical regimen and regimen often managed by psychiatry I encouraged 6 month  follow up.    Insomnia S:ambien 10mg - cuts in half and still wakes up several times a night so unfortunately imperfect control- stays up more if does not take (does not feel rested as would be expected with hypomania that is to feel rested despite poor sleep) A/P: continue ambien 5mg  alone as age based limit   Hypothyroidism S: Lab Results  Component Value Date   TSH 2.137 01/22/2015   On thyroid medication-levothyroxine 158mcg ROS-No hair (always has thin hair and takes biotin) or nail changes. No heat/cold intolerance. No constipation or diarrhea. Denies shakiness or anxiety.  A/P: we discussed potential for biotin to influence results. Will continue current dose of medication and update labs today.     Return in about 6 months (around 04/15/2016) for follow up- or sooner if needed. Return precautions advised.   Orders Placed This Encounter  Procedures  . Tdap vaccine greater than or equal to 7yo IM  . CBC    [place]  . Comprehensive metabolic panel    [place]  . TSH    [place]  . Hepatitis C antibody, reflex    [facility]    Meds ordered this encounter  Medications  . escitalopram (LEXAPRO) 10 MG tablet    Sig: Take 1 tablet (10 mg total) by mouth daily.    Dispense:  90 tablet    Refill:  3  . lamoTRIgine (LAMICTAL) 100 MG tablet    Sig: Take 1 tablet (100 mg total) by mouth 2 (two) times daily.    Dispense:  180 tablet    Refill:  3  . terconazole (TERAZOL 7) 0.4 % vaginal cream    Sig: Place 1 applicator vaginally at bedtime for 7 days    Dispense:  45 g    Refill:  2  . venlafaxine XR (EFFEXOR-XR) 37.5 MG 24 hr capsule    Sig: TAKE 1 CAPSULE by mouth EVERY DAY    Dispense:  90 capsule    Refill:  3   The duration of face-to-face time during this visit was 30 minutes. Greater than 50% of this time was spent in counseling, explanation of diagnosis, planning of further management, and/or coordination of care.     Garret Reddish, MD

## 2015-10-14 NOTE — Assessment & Plan Note (Signed)
S: Lab Results  Component Value Date   TSH 2.137 01/22/2015   On thyroid medication-levothyroxine 184mcg ROS-No hair (always has thin hair and takes biotin) or nail changes. No heat/cold intolerance. No constipation or diarrhea. Denies shakiness or anxiety.  A/P: we discussed potential for biotin to influence results. Will continue current dose of medication and update labs today.

## 2015-10-15 LAB — HEPATITIS C ANTIBODY: HCV AB: NEGATIVE

## 2015-11-06 ENCOUNTER — Other Ambulatory Visit: Payer: Self-pay | Admitting: Gynecology

## 2015-11-19 ENCOUNTER — Other Ambulatory Visit: Payer: Self-pay | Admitting: Gynecology

## 2015-11-21 ENCOUNTER — Other Ambulatory Visit: Payer: Self-pay | Admitting: Gynecology

## 2015-11-21 NOTE — Telephone Encounter (Signed)
Prescription called in and patient will be called to schedule her apt. KW CMA

## 2015-12-20 ENCOUNTER — Other Ambulatory Visit: Payer: Self-pay | Admitting: Gynecology

## 2015-12-22 ENCOUNTER — Other Ambulatory Visit: Payer: Self-pay | Admitting: Gynecology

## 2015-12-25 ENCOUNTER — Telehealth: Payer: Self-pay | Admitting: *Deleted

## 2015-12-25 MED ORDER — ESTRADIOL 1 MG PO TABS: 3.0000 mg | ORAL_TABLET | Freq: Every evening | ORAL | 0 refills | Status: DC

## 2015-12-25 MED ORDER — ZOLPIDEM TARTRATE 10 MG PO TABS: 10.0000 mg | ORAL_TABLET | Freq: Every evening | ORAL | 0 refills | Status: DC | PRN

## 2015-12-25 NOTE — Telephone Encounter (Signed)
#  30 refill okay

## 2015-12-25 NOTE — Telephone Encounter (Signed)
Rx called in and needed refill on estradiol 1 mg #90 as pt takes 3 tablets daily, this was sent as well.

## 2015-12-25 NOTE — Telephone Encounter (Signed)
Pt has annual schedule on 01/08/16 pt needs refill on Ambien 10 mg. Please advise

## 2015-12-29 ENCOUNTER — Other Ambulatory Visit: Payer: Self-pay | Admitting: Family Medicine

## 2016-01-08 ENCOUNTER — Ambulatory Visit (INDEPENDENT_AMBULATORY_CARE_PROVIDER_SITE_OTHER): Payer: PRIVATE HEALTH INSURANCE | Admitting: Gynecology

## 2016-01-08 ENCOUNTER — Encounter: Payer: Self-pay | Admitting: Gynecology

## 2016-01-08 ENCOUNTER — Telehealth: Payer: Self-pay | Admitting: *Deleted

## 2016-01-08 VITALS — BP 112/70 | Ht 69.0 in | Wt 153.0 lb

## 2016-01-08 DIAGNOSIS — G4700 Insomnia, unspecified: Secondary | ICD-10-CM | POA: Diagnosis not present

## 2016-01-08 DIAGNOSIS — E039 Hypothyroidism, unspecified: Secondary | ICD-10-CM

## 2016-01-08 DIAGNOSIS — Z01419 Encounter for gynecological examination (general) (routine) without abnormal findings: Secondary | ICD-10-CM

## 2016-01-08 DIAGNOSIS — Z79890 Hormone replacement therapy (postmenopausal): Secondary | ICD-10-CM | POA: Diagnosis not present

## 2016-01-08 DIAGNOSIS — L659 Nonscarring hair loss, unspecified: Secondary | ICD-10-CM

## 2016-01-08 DIAGNOSIS — N760 Acute vaginitis: Secondary | ICD-10-CM

## 2016-01-08 LAB — WET PREP FOR TRICH, YEAST, CLUE
CLUE CELLS WET PREP: NONE SEEN
Trich, Wet Prep: NONE SEEN
Yeast Wet Prep HPF POC: NONE SEEN

## 2016-01-08 LAB — TSH: TSH: 1.35 m[IU]/L

## 2016-01-08 MED ORDER — ESCITALOPRAM OXALATE 10 MG PO TABS: 10.0000 mg | ORAL_TABLET | Freq: Every day | ORAL | 3 refills | Status: DC

## 2016-01-08 MED ORDER — ESTRADIOL 1 MG PO TABS: 3.0000 mg | ORAL_TABLET | Freq: Every evening | ORAL | 11 refills | Status: DC

## 2016-01-08 MED ORDER — PROGESTERONE MICRONIZED 100 MG PO CAPS: 100.0000 mg | ORAL_CAPSULE | Freq: Every day | ORAL | 11 refills | Status: DC

## 2016-01-08 MED ORDER — ZOLPIDEM TARTRATE 10 MG PO TABS: 10.0000 mg | ORAL_TABLET | Freq: Every evening | ORAL | 4 refills | Status: DC | PRN

## 2016-01-08 MED ORDER — VENLAFAXINE HCL ER 37.5 MG PO CP24: ORAL_CAPSULE | ORAL | 3 refills | Status: DC

## 2016-01-08 MED ORDER — LEVOTHYROXINE SODIUM 75 MCG PO TABS: ORAL_TABLET | ORAL | 0 refills | Status: DC

## 2016-01-08 MED ORDER — NONFORMULARY OR COMPOUNDED ITEM: 4 refills | Status: DC

## 2016-01-08 MED ORDER — FLUCONAZOLE 150 MG PO TABS: 150.0000 mg | ORAL_TABLET | Freq: Once | ORAL | 0 refills | Status: AC

## 2016-01-08 NOTE — Patient Instructions (Signed)

## 2016-01-08 NOTE — Telephone Encounter (Signed)
-----   Message from Anastasio Auerbach, MD sent at 01/08/2016  8:43 AM EDT ----- [facility] do boric acid suppositories. If not then [facility]. Left the patient know either way where it went. Boric acid suppositories 600 mg #30 one per vagina twice weekly refill 4

## 2016-01-08 NOTE — Progress Notes (Signed)
Dorothy Lynch February 01, 1961 [id number]        55 y.o.  [id number]  for annual exam.  Several issues noted below  Past medical history,surgical history, problem list, medications, allergies, family history and social history were all reviewed and documented as reviewed in the EPIC chart.  ROS:  Performed with pertinent positives and negatives included in the history, assessment and plan.   Additional significant findings :  Alopecia, chronic vaginitis   Exam: Caryn Bee assistant Vitals:   01/08/16 0817  BP: 112/70  Weight: 153 lb (69.4 kg)  Height: 5\' 9"  (1.753 m)   Body mass index is 22.59 kg/m.  General appearance:  Normal affect, orientation and appearance. Skin: Grossly normal HEENT: Without gross lesions.  No cervical or supraclavicular adenopathy. Thyroid normal.  Lungs:  Clear without wheezing, rales or rhonchi Cardiac: RR, without RMG Abdominal:  Soft, nontender, without masses, guarding, rebound, organomegaly or hernia Breasts:  Examined lying and sitting without masses, retractions, discharge or axillary adenopathy. Pelvic:  Ext/BUS/Vagina normal  Cervix normal  Uterus anteverted, normal size, shape and contour, midline and mobile nontender   Adnexa without masses or tenderness    Anus and perineum normal   Rectovaginal normal sphincter tone without palpated masses or tenderness.    Assessment/Plan:  55 y.o. [id number] female for annual exam.   1. Postmenopausal/HRT. Patient continues on 3 mg of estradiol daily. One in the morning and 2 at evening. Also Prometrium 100 mg nightly. No bleeding. Still having some hot flushes. I reviewed the most current 2017 NAMS HRT guidelines. Increased risk of stroke heart attack DVT impossible breast cancer versus benefits of symptom relief possible bone health and cardiovascular protection when started early. Patient understands and accepts the risks and wants to continue. Refill 1 year provided. 2. Recurrent vaginitis. Patient is  struggling with recurrent "yeast". Uses Terazol and Diflucan intermittently. Exam today is unremarkable. Wet prep was negative. Different strategies reviewed and of recommended a trial of boric acid 600 mg suppositories #30 with 4 refills. Will use twice weekly. I did give her a supply of Diflucan 150 mg #5 to have available if needed. She'll follow up if this continues to be an issue. 3. Alopecia. Patient notes generalized hair loss with brushing of the last several months. Has had episodes of this previously which resolved. Exam is normal without frank balding areas or significant thinning. We'll check her TSH as she is on Synthroid replacement. Otherwise will monitor and follow up with dermatology if continues to be an issue. 4. Hypothyroid. Check her TSH today. Refill Synthroid 50 mg daily 1 year. 5. Anxiety/depression. Continues on Effexor XR 37.5 mg and Lexapro 10 mg daily. Is getting a good result from this. Initially started by Dr. Leanne Chang.  Refill 1 year provided. 6. Insomnia. Continues to use Ambien 10 mg intermittently for insomnia. Has good results with this without significant side effects. #30 with 4 refills provided. 7. DEXA never. Will plan further into the menopause. Increased calcium vitamin D reviewed. 8. Pap smear 2016. No Pap smear done today. No history of significant abnormal Pap smears. 9. Mammography 05/2015. Continue with annual mammography when due. SBE monthly reviewed. 10. Health maintenance. No routine lab work done as patient reports being done elsewhere through her primary physician's office.  15 minutes of my time in excess of her annual gynecologic exam was spent in direct face to face counseling and coordination of care in regards to her problems of alopecia, recurrent vaginitis, HRT 2017 new  guideline discussion.  Anastasio Auerbach MD, 8:57 AM 01/08/2016

## 2016-01-08 NOTE — Addendum Note (Signed)
Addended by: Nelva Nay on: 01/08/2016 09:16 AM   Modules accepted: Orders

## 2016-01-08 NOTE — Telephone Encounter (Signed)
Rx called in 

## 2016-01-21 ENCOUNTER — Other Ambulatory Visit: Payer: Self-pay | Admitting: Gynecology

## 2016-01-22 NOTE — Telephone Encounter (Signed)
Called into pharmacy

## 2016-02-13 ENCOUNTER — Other Ambulatory Visit: Payer: Self-pay | Admitting: Gynecology

## 2016-02-19 ENCOUNTER — Ambulatory Visit (INDEPENDENT_AMBULATORY_CARE_PROVIDER_SITE_OTHER): Payer: PRIVATE HEALTH INSURANCE | Admitting: Adult Health

## 2016-02-19 ENCOUNTER — Encounter: Payer: Self-pay | Admitting: Adult Health

## 2016-02-19 VITALS — BP 106/68 | Ht 69.0 in | Wt 153.9 lb

## 2016-02-19 DIAGNOSIS — J0110 Acute frontal sinusitis, unspecified: Secondary | ICD-10-CM | POA: Diagnosis not present

## 2016-02-19 MED ORDER — DOXYCYCLINE HYCLATE 100 MG PO CAPS: 100.0000 mg | ORAL_CAPSULE | Freq: Two times a day (BID) | ORAL | 0 refills | Status: DC

## 2016-02-19 NOTE — Progress Notes (Addendum)
Subjective:    Patient ID: Dorothy Lynch, female    DOB: 1960-12-09, 55 y.o.   MRN: [id number]  Sinusitis  This is a new problem. The current episode started more than 1 month ago. The problem is unchanged. There has been no fever. She is experiencing no pain. Associated symptoms include congestion, headaches, sinus pressure, a sore throat and swollen glands. Pertinent negatives include no chills, coughing, neck pain or shortness of breath. Past treatments include saline sprays (Netty Pot ). The treatment provided mild relief.   Review of Systems  Constitutional: Positive for activity change and fatigue. Negative for appetite change, chills and fever.  HENT: Positive for congestion, postnasal drip, rhinorrhea, sinus pressure and sore throat.   Respiratory: Negative.  Negative for cough and shortness of breath.   Cardiovascular: Negative.   Musculoskeletal: Negative.  Negative for neck pain.  Neurological: Positive for dizziness and headaches.   Past Medical History:  Diagnosis Date  . Allergy   . Anemia 07/2012  . Anxiety   . Cancer ([facility])    squamous cell skin cancer-shoulder x 3  . DDD (degenerative disc disease)   . Depression   . Disease of vein   . Episodic low back pain   . Headache(784.0)   . History of bronchitis   . Hypothyroidism 07/2012  . Migraines   . PONV (postoperative nausea and vomiting)   . Scoliosis     Social History   Social History  . Marital status: Married    Spouse name: N/A  . Number of children: N/A  . Years of education: N/A   Occupational History  . Not on file.   Social History Main Topics  . Smoking status: Never Smoker  . Smokeless tobacco: Never Used  . Alcohol use 4.2 oz/week    7 Standard drinks or equivalent per week     Comment: daily- wine & beer  . Drug use: No  . Sexual activity: Yes    Partners: Female    Birth control/ protection: Post-menopausal     Comment: 1st intercourse 55 yo-Fewer than 5 partners   Other Topics  Concern  . Not on file   Social History Narrative   Married (met husband Jenny Reichmann- will be establishing). 2 children. No grandkids.       Works with [employer]: former running, walks, gardens, entertains a lot, Training and development officer    Past Surgical History:  Procedure Laterality Date  . ABDOMINAL SURGERY  1995   inguinal hernia, double  . ACHILLES TENDON SURGERY  2001  . BREAST SURGERY  march 2011   breast bx-B-9, fibrocyctic changes  . COLONOSCOPY    . FACIAL COSMETIC SURGERY  2012  . TOTAL HIP ARTHROPLASTY Left 01/24/2015   Procedure: TOTAL HIP ARTHROPLASTY ANTERIOR APPROACH;  Surgeon: Dorna Leitz, MD;  Location: [facility];  Service: Orthopedics;  Laterality: Left;  . Vein injections      Family History  Problem Relation Age of Onset  . Diabetes Father   . Hypertension Father   . Stroke Father 5    died at 59  . Thyroid disease Mother   . Breast cancer Mother 64    84 in 2017, survived breast cancer  . Cancer Maternal Grandmother 51    lung, smoker  . Stroke Maternal Grandfather   . Heart disease Paternal Grandmother     mi  . CVA Brother     49s smoking  . Alcohol abuse Brother  recovering    Allergies  Allergen Reactions  . Penicillins Other (See Comments)    Headaches   . Sulfonamide Derivatives Other (See Comments)    Migraines    Current Outpatient Prescriptions on File Prior to Visit  Medication Sig Dispense Refill  . BIOTIN PO Take 1 tablet by mouth daily.     . cholecalciferol (VITAMIN D) 1000 UNITS tablet Take 1,000 Units by mouth daily.    Marland Kitchen escitalopram (LEXAPRO) 10 MG tablet Take 1 tablet (10 mg total) by mouth daily. 90 tablet 3  . estradiol (ESTRACE) 1 MG tablet Take 3 tablets (3 mg total) by mouth every evening. 90 tablet 11  . fexofenadine (ALLEGRA) 180 MG tablet Take 1 tablet (180 mg total) by mouth daily. 30 tablet 6  . Glucosamine HCl (GLUCOSAMINE PO) Take 1 tablet by mouth daily.    Marland Kitchen lamoTRIgine (LAMICTAL) 100 MG tablet Take 1 tablet  (100 mg total) by mouth 2 (two) times daily. 180 tablet 3  . levothyroxine (SYNTHROID, LEVOTHROID) 75 MCG tablet TAKE 1 TABLET BY MOUTH EVERY DAY BEFORE BREAKFAST 90 tablet 0  . NONFORMULARY OR COMPOUNDED ITEM Boric acid suppository 600 mg once vaginally twice weekly 30 each 4  . Omega-3 Fatty Acids (FISH OIL PO) Take 1 capsule by mouth daily.     Marland Kitchen OVER THE COUNTER MEDICATION Apply 1 application topically as directed. Glucosamine cream    . Polyethyl Glycol-Propyl Glycol (SYSTANE OP) Place 1 drop into both eyes daily as needed (for dry eyes).    . Probiotic Product (PROBIOTIC PO) Take 1 tablet by mouth daily.     . progesterone (PROMETRIUM) 100 MG capsule Take 1 capsule (100 mg total) by mouth at bedtime. 30 capsule 11  . progesterone (PROMETRIUM) 100 MG capsule TAKE 1 CAPSULE BY MOUTH AT BEDTIME 30 capsule 10  . terconazole (TERAZOL 7) 0.4 % vaginal cream Place 1 applicator vaginally at bedtime for 7 days 45 g 1  . tretinoin (RETIN-A) 0.05 % cream Apply topically at bedtime. 45 g 1  . venlafaxine XR (EFFEXOR-XR) 37.5 MG 24 hr capsule TAKE 1 CAPSULE by mouth EVERY DAY 90 capsule 3  . zolpidem (AMBIEN) 10 MG tablet TAKE 1 TABLET BY MOUTH AT BEDTIME AS NEEDED FOR SLEEP 30 tablet 4  . [DISCONTINUED] fluticasone (VERAMYST) 27.5 MCG/SPRAY nasal spray Place 2 sprays into the nose daily.       No current facility-administered medications on file prior to visit.     BP 106/68   Ht 5' 9"  (1.753 m)   Wt 153 lb 14.4 oz (69.8 kg)   LMP 09/28/2008   BMI 22.73 kg/m       Objective:   Physical Exam  Constitutional: She is oriented to person, place, and time. She appears well-developed and well-nourished. No distress.  HENT:  Head: Normocephalic and atraumatic.  Right Ear: Hearing, tympanic membrane, external ear and ear canal normal. Tympanic membrane is not bulging.  Left Ear: Tympanic membrane and external ear normal. Tympanic membrane is not bulging. Decreased hearing is noted.  Nose: Right  sinus exhibits no maxillary sinus tenderness. Left sinus exhibits frontal sinus tenderness. Left sinus exhibits no maxillary sinus tenderness.  Mouth/Throat: Uvula is midline and mucous membranes are normal. Oropharyngeal exudate present. No posterior oropharyngeal edema, posterior oropharyngeal erythema or tonsillar abscesses.  Eyes: Conjunctivae and EOM are normal. Pupils are equal, round, and reactive to light. Right eye exhibits no discharge. Left eye exhibits discharge.  Neck: Normal range of motion. Neck supple.  Cardiovascular: Normal rate, regular rhythm and normal heart sounds.  Exam reveals no gallop.   No murmur heard. Pulmonary/Chest: Effort normal and breath sounds normal. No respiratory distress. She has no wheezes. She has no rales. She exhibits no tenderness.  Lymphadenopathy:    She has cervical adenopathy.  Neurological: She is alert and oriented to person, place, and time.  Skin: Skin is warm and dry. No rash noted. She is not diaphoretic. No erythema. No pallor.  Psychiatric: She has a normal mood and affect. Her behavior is normal. Judgment and thought content normal.  Nursing note and vitals reviewed.     Assessment & Plan:  1. Acute frontal sinusitis, recurrence not specified - Continue with Netti Pot - doxycycline (VIBRAMYCIN) 100 MG capsule; Take 1 capsule (100 mg total) by mouth 2 (two) times daily.  Dispense: 14 capsule; Refill: 0 - Can use Flonase - Follow up if no improvement in the next 2-3 days  - Warned of sun sensitivity with doxycycline  Dorothyann Peng, NP

## 2016-04-14 ENCOUNTER — Encounter: Payer: Self-pay | Admitting: Internal Medicine

## 2016-04-14 ENCOUNTER — Ambulatory Visit (INDEPENDENT_AMBULATORY_CARE_PROVIDER_SITE_OTHER): Payer: PRIVATE HEALTH INSURANCE | Admitting: Internal Medicine

## 2016-04-14 VITALS — BP 112/82 | HR 70 | Temp 97.8°F | Ht 69.0 in | Wt 157.0 lb

## 2016-04-14 DIAGNOSIS — J320 Chronic maxillary sinusitis: Secondary | ICD-10-CM | POA: Diagnosis not present

## 2016-04-14 DIAGNOSIS — Z889 Allergy status to unspecified drugs, medicaments and biological substances status: Secondary | ICD-10-CM | POA: Diagnosis not present

## 2016-04-14 MED ORDER — DOXYCYCLINE HYCLATE 100 MG PO TABS
100.0000 mg | ORAL_TABLET | Freq: Two times a day (BID) | ORAL | 0 refills | Status: DC
Start: 2016-04-14 — End: 2016-05-04

## 2016-04-14 NOTE — Progress Notes (Signed)
Chief Complaint  Patient presents with  . Acute Visit    sinus congestion    HPI: Dorothy Lynch 55 y.o.   sda   Work in   Concern  Onset  3 weeks  Ago        No fever but co Left sided nasal congestion with thick green discharge. Left face tenderness now left ear discomfort. About 2 months ago she saw [facility] for an ongoing sinus symptoms   And was given antibiotic  And helped and then came back  And r left side  Of face  Dark green  Left face .  No coughing  .  Headachy sore throat .  No fever  Using nettie  Pot  To help .  Water .   hydtaion not a cold this time   Minor cough    Has an appointment with her primary tomorrow but she should come in today because of the severity of her symptoms. No chest pain shortness of breath or fever.   ROS: See pertinent positives and negatives per HPI. No cp sob  Hemoptysis    Past Medical History:  Diagnosis Date  . Allergy   . Anemia 07/2012  . Anxiety   . Cancer (HCC)    squamous cell skin cancer-shoulder x 3  . DDD (degenerative disc disease)   . Depression   . Disease of vein   . Episodic low back pain   . Headache(784.0)   . History of bronchitis   . Hypothyroidism 07/2012  . Migraines   . PONV (postoperative nausea and vomiting)   . Scoliosis     Family History  Problem Relation Age of Onset  . Diabetes Father   . Hypertension Father   . Stroke Father 81    died at 34  . Thyroid disease Mother   . Breast cancer Mother 92    84 in 2017, survived breast cancer  . Cancer Maternal Grandmother 41    lung, smoker  . Stroke Maternal Grandfather   . Heart disease Paternal Grandmother     mi  . CVA Brother     51s smoking  . Alcohol abuse Brother     recovering    Social History   Social History  . Marital status: Married    Spouse name: N/A  . Number of children: N/A  . Years of education: N/A   Social History Main Topics  . Smoking status: Never Smoker  . Smokeless tobacco: Never Used  . Alcohol use 4.2 oz/week   7 Standard drinks or equivalent per week     Comment: daily- wine & beer  . Drug use: No  . Sexual activity: Yes    Partners: Female    Birth control/ protection: Post-menopausal     Comment: 1st intercourse 55 yo-Fewer than 5 partners   Other Topics Concern  . Not on file   Social History Narrative   Married (met husband Jenny Reichmann- will be establishing). 2 children. No grandkids.       Works with [employer]: former running, walks, gardens, entertains a lot, cook    Outpatient Medications Prior to Visit  Medication Sig Dispense Refill  . BIOTIN PO Take 1 tablet by mouth daily.     . cholecalciferol (VITAMIN D) 1000 UNITS tablet Take 1,000 Units by mouth daily.    Marland Kitchen escitalopram (LEXAPRO) 10 MG tablet Take 1 tablet (10 mg total) by mouth daily. 90 tablet  3  . estradiol (ESTRACE) 1 MG tablet Take 3 tablets (3 mg total) by mouth every evening. 90 tablet 11  . fexofenadine (ALLEGRA) 180 MG tablet Take 1 tablet (180 mg total) by mouth daily. 30 tablet 6  . Glucosamine HCl (GLUCOSAMINE PO) Take 1 tablet by mouth daily.    Marland Kitchen lamoTRIgine (LAMICTAL) 100 MG tablet Take 1 tablet (100 mg total) by mouth 2 (two) times daily. 180 tablet 3  . levothyroxine (SYNTHROID, LEVOTHROID) 75 MCG tablet TAKE 1 TABLET BY MOUTH EVERY DAY BEFORE BREAKFAST 90 tablet 0  . NONFORMULARY OR COMPOUNDED ITEM Boric acid suppository 600 mg once vaginally twice weekly 30 each 4  . Omega-3 Fatty Acids (FISH OIL PO) Take 1 capsule by mouth daily.     Marland Kitchen OVER THE COUNTER MEDICATION Apply 1 application topically as directed. Glucosamine cream    . Polyethyl Glycol-Propyl Glycol (SYSTANE OP) Place 1 drop into both eyes daily as needed (for dry eyes).    . Probiotic Product (PROBIOTIC PO) Take 1 tablet by mouth daily.     . progesterone (PROMETRIUM) 100 MG capsule Take 1 capsule (100 mg total) by mouth at bedtime. 30 capsule 11  . terconazole (TERAZOL 7) 0.4 % vaginal cream Place 1 applicator vaginally at  bedtime for 7 days 45 g 1  . tretinoin (RETIN-A) 0.05 % cream Apply topically at bedtime. 45 g 1  . venlafaxine XR (EFFEXOR-XR) 37.5 MG 24 hr capsule TAKE 1 CAPSULE by mouth EVERY DAY 90 capsule 3  . zolpidem (AMBIEN) 10 MG tablet TAKE 1 TABLET BY MOUTH AT BEDTIME AS NEEDED FOR SLEEP 30 tablet 4  . doxycycline (VIBRAMYCIN) 100 MG capsule Take 1 capsule (100 mg total) by mouth 2 (two) times daily. 14 capsule 0  . progesterone (PROMETRIUM) 100 MG capsule TAKE 1 CAPSULE BY MOUTH AT BEDTIME 30 capsule 10   No facility-administered medications prior to visit.      EXAM:  BP 112/82   Pulse 70   Temp 97.8 F (36.6 C)   Ht 5' 9"  (1.753 m)   Wt 157 lb (71.2 kg)   LMP 09/28/2008   SpO2 96%   BMI 23.18 kg/m   Body mass index is 23.18 kg/m.  GENERAL: vitals reviewed and listed above, alert, oriented, appears well hydrated and in no acute distress well groomed mildly congested. Nontoxic no respiratory distress. HEENT: atraumatic, conjunctiva  clear, no obvious abnormalities on inspection of external nose and ears TMs show normal landmarks gray. Nares +1 congested minimal mucoid discharge last face tenderness over the left maxilla area slightly over frontal last OP : no lesion edema or exudate some erythema posterior pharynx but no drainage seen. NECK: no obvious masses on inspection palpation  LUNGS: clear to auscultation bilaterally, no wheezes, rales or rhonchi, good air movement CV: HRRR, no clubbing cyanosis or  peripheral edema nl cap refill  MS: moves all extremities without noticeable focal  abnormality   ASSESSMENT AND PLAN:  Discussed the following assessment and plan:  Left maxillary sinusitis  Hx of seasonal allergies 2 -3 week history of symptoms with localized nasal congestion and face pressure not responsive to current regimen. She is allergic to penicillin he states but it is a GI side effect. Treated for possible frontal sinusitis 3 months ago.  Treatment doxycycline  continue sinus hygiene consideration of adding short course steroids but not today because of potential side effects. Patient aware expectant management he states that her last infection was bilateral not left local.  Discussed the possibility of underlying allergy triggering the problem does have seasonal symptoms. Counseled. -Patient advised to return or notify health care team  if symptoms worsen ,persist or new concerns arise.  Patient Instructions  I agree this acts like  Sinusitis .    Add antibiotic and continue sinus hygiene with saline and Flonase. Decongestants if you're able to tolerate them. If persistent and progressive consider other treatments.    Follow-up with your primary care doctor if recurrent or progressive.  Sinusitis, Adult Sinusitis is soreness and inflammation of your sinuses. Sinuses are hollow spaces in the bones around your face. Your sinuses are located:  Around your eyes.  In the middle of your forehead.  Behind your nose.  In your cheekbones. Your sinuses and nasal passages are lined with a stringy fluid (mucus). Mucus normally drains out of your sinuses. When your nasal tissues become inflamed or swollen, the mucus can become trapped or blocked so air cannot flow through your sinuses. This allows bacteria, viruses, and funguses to grow, which leads to infection. Sinusitis can develop quickly and last for 7?10 days (acute) or for more than 12 weeks (chronic). Sinusitis often develops after a cold. What are the causes? This condition is caused by anything that creates swelling in the sinuses or stops mucus from draining, including:  Allergies.  Asthma.  Bacterial or viral infection.  Abnormally shaped bones between the nasal passages.  Nasal growths that contain mucus (nasal polyps).  Narrow sinus openings.  Pollutants, such as chemicals or irritants in the air.  A foreign object stuck in the nose.  A fungal infection. This is rare. What  increases the risk? The following factors may make you more likely to develop this condition:  Having allergies or asthma.  Having had a recent cold or respiratory tract infection.  Having structural deformities or blockages in your nose or sinuses.  Having a weak immune system.  Doing a lot of swimming or diving.  Overusing nasal sprays.  Smoking. What are the signs or symptoms? The main symptoms of this condition are pain and a feeling of pressure around the affected sinuses. Other symptoms include:  Upper toothache.  Earache.  Headache.  Bad breath.  Decreased sense of smell and taste.  A cough that may get worse at night.  Fatigue.  Fever.  Thick drainage from your nose. The drainage is often green and it may contain pus (purulent).  Stuffy nose or congestion.  Postnasal drip. This is when extra mucus collects in the throat or back of the nose.  Swelling and warmth over the affected sinuses.  Sore throat.  Sensitivity to light. How is this diagnosed? This condition is diagnosed based on symptoms, a medical history, and a physical exam. To find out if your condition is acute or chronic, your health care provider may:  Look in your nose for signs of nasal polyps.  Tap over the affected sinus to check for signs of infection.  View the inside of your sinuses using an imaging device that has a light attached (endoscope). If your health care provider suspects that you have chronic sinusitis, you may also:  Be tested for allergies.  Have a sample of mucus taken from your nose (nasal culture) and checked for bacteria.  Have a mucus sample examined to see if your sinusitis is related to an allergy. If your sinusitis does not respond to treatment and it lasts longer than 8 weeks, you may have an MRI or CT scan to  check your sinuses. These scans also help to determine how severe your infection is. In rare cases, a bone biopsy may be done to rule out more serious  types of fungal sinus disease. How is this treated? Treatment for sinusitis depends on the cause and whether your condition is chronic or acute. If a virus is causing your sinusitis, your symptoms will go away on their own within 10 days. You may be given medicines to relieve your symptoms, including:  Topical nasal decongestants. They shrink swollen nasal passages and let mucus drain from your sinuses.  Antihistamines. These drugs block inflammation that is triggered by allergies. This can help to ease swelling in your nose and sinuses.  Topical nasal corticosteroids. These are nasal sprays that ease inflammation and swelling in your nose and sinuses.  Nasal saline washes. These rinses can help to get rid of thick mucus in your nose. If your condition is caused by bacteria, you will be given an antibiotic medicine. If your condition is caused by a fungus, you will be given an antifungal medicine. Surgery may be needed to correct underlying conditions, such as narrow nasal passages. Surgery may also be needed to remove polyps. Follow these instructions at home: Medicines  Take, use, or apply over-the-counter and prescription medicines only as told by your health care provider. These may include nasal sprays.  If you were prescribed an antibiotic medicine, take it as told by your health care provider. Do not stop taking the antibiotic even if you start to feel better. Hydrate and Humidify  Drink enough water to keep your urine clear or pale yellow. Staying hydrated will help to thin your mucus.  Use a cool mist humidifier to keep the humidity level in your home above 50%.  Inhale steam for 10-15 minutes, 3-4 times a day or as told by your health care provider. You can do this in the bathroom while a hot shower is running.  Limit your exposure to cool or dry air. Rest  Rest as much as possible.  Sleep with your head raised (elevated).  Make sure to get enough sleep each  night. General instructions  Apply a warm, moist washcloth to your face 3-4 times a day or as told by your health care provider. This will help with discomfort.  Wash your hands often with soap and water to reduce your exposure to viruses and other germs. If soap and water are not available, use hand sanitizer.  Do not smoke. Avoid being around people who are smoking (secondhand smoke).  Keep all follow-up visits as told by your health care provider. This is important. Contact a health care provider if:  You have a fever.  Your symptoms get worse.  Your symptoms do not improve within 10 days. Get help right away if:  You have a severe headache.  You have persistent vomiting.  You have pain or swelling around your face or eyes.  You have vision problems.  You develop confusion.  Your neck is stiff.  You have trouble breathing. This information is not intended to replace advice given to you by your health care provider. Make sure you discuss any questions you have with your health care provider. Document Released: 05/17/2005 Document Revised: 01/11/2016 Document Reviewed: 03/12/2015 Elsevier Interactive Patient Education  2017 [place] K. Panosh M.D.

## 2016-04-14 NOTE — Patient Instructions (Addendum)
I agree this acts like  Sinusitis .    Add antibiotic and continue sinus hygiene with saline and Flonase. Decongestants if you're able to tolerate them. If persistent and progressive consider other treatments.    Follow-up with your primary care doctor if recurrent or progressive.  Sinusitis, Adult Sinusitis is soreness and inflammation of your sinuses. Sinuses are hollow spaces in the bones around your face. Your sinuses are located:  Around your eyes.  In the middle of your forehead.  Behind your nose.  In your cheekbones. Your sinuses and nasal passages are lined with a stringy fluid (mucus). Mucus normally drains out of your sinuses. When your nasal tissues become inflamed or swollen, the mucus can become trapped or blocked so air cannot flow through your sinuses. This allows bacteria, viruses, and funguses to grow, which leads to infection. Sinusitis can develop quickly and last for 7?10 days (acute) or for more than 12 weeks (chronic). Sinusitis often develops after a cold. What are the causes? This condition is caused by anything that creates swelling in the sinuses or stops mucus from draining, including:  Allergies.  Asthma.  Bacterial or viral infection.  Abnormally shaped bones between the nasal passages.  Nasal growths that contain mucus (nasal polyps).  Narrow sinus openings.  Pollutants, such as chemicals or irritants in the air.  A foreign object stuck in the nose.  A fungal infection. This is rare. What increases the risk? The following factors may make you more likely to develop this condition:  Having allergies or asthma.  Having had a recent cold or respiratory tract infection.  Having structural deformities or blockages in your nose or sinuses.  Having a weak immune system.  Doing a lot of swimming or diving.  Overusing nasal sprays.  Smoking. What are the signs or symptoms? The main symptoms of this condition are pain and a feeling of  pressure around the affected sinuses. Other symptoms include:  Upper toothache.  Earache.  Headache.  Bad breath.  Decreased sense of smell and taste.  A cough that may get worse at night.  Fatigue.  Fever.  Thick drainage from your nose. The drainage is often green and it may contain pus (purulent).  Stuffy nose or congestion.  Postnasal drip. This is when extra mucus collects in the throat or back of the nose.  Swelling and warmth over the affected sinuses.  Sore throat.  Sensitivity to light. How is this diagnosed? This condition is diagnosed based on symptoms, a medical history, and a physical exam. To find out if your condition is acute or chronic, your health care provider may:  Look in your nose for signs of nasal polyps.  Tap over the affected sinus to check for signs of infection.  View the inside of your sinuses using an imaging device that has a light attached (endoscope). If your health care provider suspects that you have chronic sinusitis, you may also:  Be tested for allergies.  Have a sample of mucus taken from your nose (nasal culture) and checked for bacteria.  Have a mucus sample examined to see if your sinusitis is related to an allergy. If your sinusitis does not respond to treatment and it lasts longer than 8 weeks, you may have an MRI or CT scan to check your sinuses. These scans also help to determine how severe your infection is. In rare cases, a bone biopsy may be done to rule out more serious types of fungal sinus disease. How is this  treated? Treatment for sinusitis depends on the cause and whether your condition is chronic or acute. If a virus is causing your sinusitis, your symptoms will go away on their own within 10 days. You may be given medicines to relieve your symptoms, including:  Topical nasal decongestants. They shrink swollen nasal passages and let mucus drain from your sinuses.  Antihistamines. These drugs block inflammation  that is triggered by allergies. This can help to ease swelling in your nose and sinuses.  Topical nasal corticosteroids. These are nasal sprays that ease inflammation and swelling in your nose and sinuses.  Nasal saline washes. These rinses can help to get rid of thick mucus in your nose. If your condition is caused by bacteria, you will be given an antibiotic medicine. If your condition is caused by a fungus, you will be given an antifungal medicine. Surgery may be needed to correct underlying conditions, such as narrow nasal passages. Surgery may also be needed to remove polyps. Follow these instructions at home: Medicines  Take, use, or apply over-the-counter and prescription medicines only as told by your health care provider. These may include nasal sprays.  If you were prescribed an antibiotic medicine, take it as told by your health care provider. Do not stop taking the antibiotic even if you start to feel better. Hydrate and Humidify  Drink enough water to keep your urine clear or pale yellow. Staying hydrated will help to thin your mucus.  Use a cool mist humidifier to keep the humidity level in your home above 50%.  Inhale steam for 10-15 minutes, 3-4 times a day or as told by your health care provider. You can do this in the bathroom while a hot shower is running.  Limit your exposure to cool or dry air. Rest  Rest as much as possible.  Sleep with your head raised (elevated).  Make sure to get enough sleep each night. General instructions  Apply a warm, moist washcloth to your face 3-4 times a day or as told by your health care provider. This will help with discomfort.  Wash your hands often with soap and water to reduce your exposure to viruses and other germs. If soap and water are not available, use hand sanitizer.  Do not smoke. Avoid being around people who are smoking (secondhand smoke).  Keep all follow-up visits as told by your health care provider. This is  important. Contact a health care provider if:  You have a fever.  Your symptoms get worse.  Your symptoms do not improve within 10 days. Get help right away if:  You have a severe headache.  You have persistent vomiting.  You have pain or swelling around your face or eyes.  You have vision problems.  You develop confusion.  Your neck is stiff.  You have trouble breathing. This information is not intended to replace advice given to you by your health care provider. Make sure you discuss any questions you have with your health care provider. Document Released: 05/17/2005 Document Revised: 01/11/2016 Document Reviewed: 03/12/2015 Elsevier Interactive Patient Education  2017 [employer].

## 2016-04-15 ENCOUNTER — Ambulatory Visit (INDEPENDENT_AMBULATORY_CARE_PROVIDER_SITE_OTHER): Payer: PRIVATE HEALTH INSURANCE | Admitting: Family Medicine

## 2016-04-15 ENCOUNTER — Encounter: Payer: Self-pay | Admitting: Family Medicine

## 2016-04-15 DIAGNOSIS — F3181 Bipolar II disorder: Secondary | ICD-10-CM | POA: Diagnosis not present

## 2016-04-15 NOTE — Patient Instructions (Signed)
I am worried your ups and downs may indicate poor control of your bipolar. Glad you are talking to [facility] your psychologist soon. I really want his opinion on you getting back in with psychiatry (this would be my preferred option). I am also willing to Randall Hiss about his opinion on medication changes.   Get on mychart so we can discuss the end result of your visit  See me at least every 6 months with atypical regimen  Health Maintenance Due  Topic Date Due  . INFLUENZA VACCINE  12/30/2015

## 2016-04-15 NOTE — Progress Notes (Signed)
Pre visit review using our clinic review tool, if applicable. No additional management support is needed unless otherwise documented below in the visit note. 

## 2016-04-15 NOTE — Assessment & Plan Note (Signed)
S: remains on lexapro 10mg , effexor 37.5mg  extended release, lamictal 100mg  BID. Prior this was set up through consultation with psychiatry years ago when Dr. Candi Leash here. Patient states that she is either "up or down" pretty much all the time. May have 3-4 days of a lot of extra energy and then feel down for several days but denies depressed mood or anhedonia- just does not feel like getting as much done. She sleeps through both phases reasonably well. But on on insomnia on ambien 5mg - had advised to stay on lower dose given gender based limits. She previously had denied potential hypomania symptoms but because of atypical regimen I had advised at least every 6 month follow up A/P: She is seeing her prior psychogist Randall Hiss very soon due to some financial concerns and marital stress. He apparently helped craft her current regimen. I discussed with concern for possible hypomania that I would strongly like her to see psychiatry. She wants to see Randall Hiss first and see if we could possibly make adjustments in coordination with him- once again I advised psychiatry but did discuss I would be willing to discuss with him. Extended counseling required today about bipolar, anxiety, stress issues. She is to update me by [employer] after signing up

## 2016-04-15 NOTE — Progress Notes (Signed)
Subjective:  Dorothy Lynch is a 55 y.o. year old very pleasant female patient who presents for/with See problem oriented charting ROS- No SI/HI. No chest pain or shortness of breath.see any ROS included in HPI as well.   Past Medical History-  Patient Active Problem List   Diagnosis Date Noted  . Bipolar II disorder ([place]) 01/18/2011    Priority: High  . Menopausal symptoms 10/14/2015    Priority: Medium  . Hypothyroidism 10/14/2015    Priority: Medium  . Insomnia 01/18/2011    Priority: Medium  . Allergic rhinitis 10/14/2015    Priority: Low  . Yeast infection 10/14/2015    Priority: Low  . Primary osteoarthritis of left hip 01/24/2015    Priority: Low  . History of squamous cell carcinoma of skin     Priority: Low    Medications- reviewed and updated Current Outpatient Prescriptions  Medication Sig Dispense Refill  . BIOTIN PO Take 1 tablet by mouth daily.     . cholecalciferol (VITAMIN D) 1000 UNITS tablet Take 1,000 Units by mouth daily.    Marland Kitchen doxycycline (VIBRA-TABS) 100 MG tablet Take 1 tablet (100 mg total) by mouth 2 (two) times daily. 14 tablet 0  . escitalopram (LEXAPRO) 10 MG tablet Take 1 tablet (10 mg total) by mouth daily. 90 tablet 3  . estradiol (ESTRACE) 1 MG tablet Take 3 tablets (3 mg total) by mouth every evening. 90 tablet 11  . fexofenadine (ALLEGRA) 180 MG tablet Take 1 tablet (180 mg total) by mouth daily. 30 tablet 6  . Glucosamine HCl (GLUCOSAMINE PO) Take 1 tablet by mouth daily.    Marland Kitchen lamoTRIgine (LAMICTAL) 100 MG tablet Take 1 tablet (100 mg total) by mouth 2 (two) times daily. 180 tablet 3  . levothyroxine (SYNTHROID, LEVOTHROID) 75 MCG tablet TAKE 1 TABLET BY MOUTH EVERY DAY BEFORE BREAKFAST 90 tablet 0  . NONFORMULARY OR COMPOUNDED ITEM Boric acid suppository 600 mg once vaginally twice weekly 30 each 4  . Omega-3 Fatty Acids (FISH OIL PO) Take 1 capsule by mouth daily.     Marland Kitchen OVER THE COUNTER MEDICATION Apply 1 application topically as directed.  Glucosamine cream    . Polyethyl Glycol-Propyl Glycol (SYSTANE OP) Place 1 drop into both eyes daily as needed (for dry eyes).    . Probiotic Product (PROBIOTIC PO) Take 1 tablet by mouth daily.     . progesterone (PROMETRIUM) 100 MG capsule Take 1 capsule (100 mg total) by mouth at bedtime. 30 capsule 11  . terconazole (TERAZOL 7) 0.4 % vaginal cream Place 1 applicator vaginally at bedtime for 7 days 45 g 1  . tretinoin (RETIN-A) 0.05 % cream Apply topically at bedtime. 45 g 1  . venlafaxine XR (EFFEXOR-XR) 37.5 MG 24 hr capsule TAKE 1 CAPSULE by mouth EVERY DAY 90 capsule 3  . zolpidem (AMBIEN) 10 MG tablet TAKE 1 TABLET BY MOUTH AT BEDTIME AS NEEDED FOR SLEEP 30 tablet 4   No current facility-administered medications for this visit.     Objective: BP 108/82 (BP Location: Left Arm, Patient Position: Sitting, Cuff Size: Normal)   Pulse 62   Temp 97.8 F (36.6 C) (Oral)   Wt 159 lb 9.6 oz (72.4 kg)   LMP 09/28/2008   SpO2 97%   BMI 23.57 kg/m  Gen: NAD, resting comfortably Tm normal, no sinus tenderness- improved on antibiotic, nares erythematous with some yellow drainage CV: RRR no murmurs rubs or gallops Lungs: CTAB no crackles, wheeze, rhonchi Ext: no edema  Assessment/Plan:  Bipolar II disorder ([place]) S: remains on lexapro 10mg , effexor 37.5mg  extended release, lamictal 100mg  BID. Prior this was set up through consultation with psychiatry years ago when Dr. Candi Leash here. Patient states that she is either "up or down" pretty much all the time. May have 3-4 days of a lot of extra energy and then feel down for several days but denies depressed mood or anhedonia- just does not feel like getting as much done. She sleeps through both phases reasonably well. But on on insomnia on ambien 5mg - had advised to stay on lower dose given gender based limits. She previously had denied potential hypomania symptoms but because of atypical regimen I had advised at least every 6 month follow up A/P:  She is seeing her prior psychogist Randall Hiss very soon due to some financial concerns and marital stress. He apparently helped craft her current regimen. I discussed with concern for possible hypomania that I would strongly like her to see psychiatry. She wants to see Randall Hiss first and see if we could possibly make adjustments in coordination with him- once again I advised psychiatry but did discuss I would be willing to discuss with him. Extended counseling required today about bipolar, anxiety, stress issues. She is to update me by [facility] after signing up  The duration of face-to-face time during this visit was greater than 25 minutes. Greater than 50% of this time was spent in counseling, explanation of diagnosis as well as my concern about potential hypomania, planning of further management- discussing follow up plan.    Return precautions advised.  Garret Reddish, MD

## 2016-05-04 ENCOUNTER — Ambulatory Visit (INDEPENDENT_AMBULATORY_CARE_PROVIDER_SITE_OTHER): Payer: PRIVATE HEALTH INSURANCE | Admitting: Family Medicine

## 2016-05-04 ENCOUNTER — Encounter: Payer: Self-pay | Admitting: Internal Medicine

## 2016-05-04 VITALS — BP 100/58 | HR 63 | Temp 98.1°F | Ht 69.0 in | Wt 154.4 lb

## 2016-05-04 DIAGNOSIS — J0111 Acute recurrent frontal sinusitis: Secondary | ICD-10-CM | POA: Diagnosis not present

## 2016-05-04 MED ORDER — AMOXICILLIN-POT CLAVULANATE 875-125 MG PO TABS: 1.0000 | ORAL_TABLET | Freq: Two times a day (BID) | ORAL | 0 refills | Status: DC

## 2016-05-04 NOTE — Progress Notes (Signed)
Pre visit review using our clinic review tool, if applicable. No additional management support is needed unless otherwise documented below in the visit note. 

## 2016-05-04 NOTE — Patient Instructions (Signed)
Persistent Sinsusitis Bacterial based on: Symptoms >10 days, double sickening. Failed short course of doxycycline. Will trial 2 weeks of Augmentin. Will send [place] a mychart message (signing up today if this fails to improve symptoms and refer to ENT)  Headache years ago on penicillin. Thinks she has done ok with augmentin. Discussed if Headache occurs- can change- would likely do levaquin 14 days 500mg . Can send mychart message about this  Finally, we reviewed reasons to return to care including if symptoms worsen or persist or new concerns arise (particularly fever or shortness of breath)  Meds ordered this encounter  Medications  . amoxicillin-clavulanate (AUGMENTIN) 875-125 MG tablet    Sig: Take 1 tablet by mouth 2 (two) times daily.    Dispense:  14 tablet    Refill:  0

## 2016-05-04 NOTE — Progress Notes (Addendum)
PCP: Garret Reddish, MD  Subjective:  Dorothy Lynch is a 55 y.o. year old very pleasant female patient who presents with persistent sinusitis symptoms including nasal congestion, sinus tenderness primarily on the left -other symptoms include: thick yellow or green drainage from left "like concrete". Sinusitis in September completely cleared on doxycycline. Recurred late October and treated with doxycycline again. Got about 70% better before course ended then worsened again after.  -day of illness: at least 6 weeks -Symptoms show no change after worsening when antibiotics stopped -previous treatments: doxycycline course -sick contacts/travel/risks: denies flu exposure.   ROS-denies fever, SOB, NVD, tooth pain- does complain of left sided maxillary and frontal sinus pain  Pertinent Past Medical History-  Patient Active Problem List   Diagnosis Date Noted  . Bipolar II disorder ([place]) 01/18/2011    Priority: High  . Menopausal symptoms 10/14/2015    Priority: Medium  . Hypothyroidism 10/14/2015    Priority: Medium  . Insomnia 01/18/2011    Priority: Medium  . Allergic rhinitis 10/14/2015    Priority: Low  . Yeast infection 10/14/2015    Priority: Low  . Primary osteoarthritis of left hip 01/24/2015    Priority: Low  . History of squamous cell carcinoma of skin     Priority: Low    Medications- reviewed  Current Outpatient Prescriptions  Medication Sig Dispense Refill  . BIOTIN PO Take 1 tablet by mouth daily.     . cholecalciferol (VITAMIN D) 1000 UNITS tablet Take 1,000 Units by mouth daily.    Marland Kitchen escitalopram (LEXAPRO) 10 MG tablet Take 1 tablet (10 mg total) by mouth daily. 90 tablet 3  . estradiol (ESTRACE) 1 MG tablet Take 3 tablets (3 mg total) by mouth every evening. 90 tablet 11  . fexofenadine (ALLEGRA) 180 MG tablet Take 1 tablet (180 mg total) by mouth daily. 30 tablet 6  . Glucosamine HCl (GLUCOSAMINE PO) Take 1 tablet by mouth daily.    Marland Kitchen lamoTRIgine (LAMICTAL) 100 MG  tablet Take 1 tablet (100 mg total) by mouth 2 (two) times daily. 180 tablet 3  . levothyroxine (SYNTHROID, LEVOTHROID) 75 MCG tablet TAKE 1 TABLET BY MOUTH EVERY DAY BEFORE BREAKFAST 90 tablet 0  . NONFORMULARY OR COMPOUNDED ITEM Boric acid suppository 600 mg once vaginally twice weekly 30 each 4  . Omega-3 Fatty Acids (FISH OIL PO) Take 1 capsule by mouth daily.     Marland Kitchen OVER THE COUNTER MEDICATION Apply 1 application topically as directed. Glucosamine cream    . Polyethyl Glycol-Propyl Glycol (SYSTANE OP) Place 1 drop into both eyes daily as needed (for dry eyes).    . Probiotic Product (PROBIOTIC PO) Take 1 tablet by mouth daily.     . progesterone (PROMETRIUM) 100 MG capsule Take 1 capsule (100 mg total) by mouth at bedtime. 30 capsule 11  . terconazole (TERAZOL 7) 0.4 % vaginal cream Place 1 applicator vaginally at bedtime for 7 days 45 g 1  . tretinoin (RETIN-A) 0.05 % cream Apply topically at bedtime. 45 g 1  . venlafaxine XR (EFFEXOR-XR) 37.5 MG 24 hr capsule TAKE 1 CAPSULE by mouth EVERY DAY 90 capsule 3  . zolpidem (AMBIEN) 10 MG tablet TAKE 1 TABLET BY MOUTH AT BEDTIME AS NEEDED FOR SLEEP 30 tablet 4   No current facility-administered medications for this visit.     Objective: BP (!) 100/58 (BP Location: Left Arm, Patient Position: Sitting, Cuff Size: Normal)   Pulse 63   Temp 98.1 F (36.7 C) (Oral)  Ht 5\' 9"  (1.753 m)   Wt 154 lb 6.1 oz (70 kg)   LMP 09/28/2008   SpO2 98%   BMI 22.80 kg/m  Gen: NAD, resting comfortably HEENT: Turbinates erythematous with yellow drainage, TM normal, pharynx mildly erythematous with no tonsilar exudate or edema, left maxillary and frontal sinus tenderness CV: RRR no murmurs rubs or gallops Lungs: CTAB no crackles, wheeze, rhonchi Ext: no edema Skin: warm, dry, no rash  Assessment/Plan:  Persistent Sinsusitis Bacterial based on: Symptoms >10 days, double sickening. Failed short course of doxycycline. Will trial 2 weeks of Augmentin. Will  send [place] a mychart message (signing up today if this fails to improve symptoms and refer to ENT)  HA years ago on penicillin. Thinks she has done ok with augmentin. Discussed if HA occurs- can change- would likely do levaquin 14 days 500mg .   Finally, we reviewed reasons to return to care including if symptoms worsen or persist or new concerns arise (particularly fever or shortness of breath)  Roselyn Reef called in #28 as I gave 1 week too short of an rx  Meds ordered this encounter  Medications  . amoxicillin-clavulanate (AUGMENTIN) 875-125 MG tablet    Sig: Take 1 tablet by mouth 2 (two) times daily.    Dispense:  14 tablet    Refill:  0    Garret Reddish, MD

## 2016-05-22 ENCOUNTER — Other Ambulatory Visit: Payer: Self-pay | Admitting: Gynecology

## 2016-06-02 ENCOUNTER — Telehealth: Payer: Self-pay | Admitting: *Deleted

## 2016-06-02 ENCOUNTER — Other Ambulatory Visit: Payer: Self-pay

## 2016-06-02 DIAGNOSIS — J329 Chronic sinusitis, unspecified: Secondary | ICD-10-CM

## 2016-06-02 MED ORDER — LEVOTHYROXINE SODIUM 75 MCG PO TABS: ORAL_TABLET | ORAL | 3 refills | Status: DC

## 2016-06-02 NOTE — Telephone Encounter (Signed)
Rx sent for synthroid 75 mcg, had annual in Aug. 2017

## 2016-06-08 ENCOUNTER — Other Ambulatory Visit: Payer: Self-pay | Admitting: Otolaryngology

## 2016-06-08 DIAGNOSIS — R0981 Nasal congestion: Secondary | ICD-10-CM

## 2016-06-08 DIAGNOSIS — G8929 Other chronic pain: Secondary | ICD-10-CM

## 2016-06-08 DIAGNOSIS — R51 Headache: Secondary | ICD-10-CM

## 2016-06-08 DIAGNOSIS — R519 Headache, unspecified: Secondary | ICD-10-CM

## 2016-06-09 ENCOUNTER — Encounter: Payer: Self-pay | Admitting: Gynecology

## 2016-06-11 ENCOUNTER — Ambulatory Visit
Admission: RE | Admit: 2016-06-11 | Discharge: 2016-06-11 | Disposition: A | Discharge status: Home / Self Care | Payer: PRIVATE HEALTH INSURANCE | Source: Ambulatory Visit | Attending: Otolaryngology | Admitting: Otolaryngology

## 2016-06-11 DIAGNOSIS — R519 Headache, unspecified: Secondary | ICD-10-CM

## 2016-06-11 DIAGNOSIS — R51 Headache: Secondary | ICD-10-CM

## 2016-06-11 DIAGNOSIS — G8929 Other chronic pain: Secondary | ICD-10-CM

## 2016-06-11 DIAGNOSIS — R0981 Nasal congestion: Secondary | ICD-10-CM

## 2016-06-29 ENCOUNTER — Other Ambulatory Visit: Payer: Self-pay | Admitting: Gynecology

## 2016-06-29 NOTE — Telephone Encounter (Signed)
rx called into pharmacy

## 2016-06-29 NOTE — Telephone Encounter (Signed)
Okay for Ambien 10 mg #30 with 5 refills

## 2016-07-07 ENCOUNTER — Ambulatory Visit (INDEPENDENT_AMBULATORY_CARE_PROVIDER_SITE_OTHER): Payer: PRIVATE HEALTH INSURANCE | Admitting: Family Medicine

## 2016-07-07 VITALS — BP 108/74 | HR 94 | Temp 98.7°F | Ht 69.0 in | Wt 156.0 lb

## 2016-07-07 DIAGNOSIS — M791 Myalgia, unspecified site: Secondary | ICD-10-CM

## 2016-07-07 DIAGNOSIS — J101 Influenza due to other identified influenza virus with other respiratory manifestations: Secondary | ICD-10-CM | POA: Diagnosis not present

## 2016-07-07 LAB — POCT INFLUENZA A/B
INFLUENZA A, POC: POSITIVE — AB
INFLUENZA B, POC: NEGATIVE

## 2016-07-07 MED ORDER — OSELTAMIVIR PHOSPHATE 75 MG PO CAPS: 75.0000 mg | ORAL_CAPSULE | Freq: Two times a day (BID) | ORAL | 0 refills | Status: DC

## 2016-07-07 NOTE — Progress Notes (Signed)
Subjective:     Patient ID: Dorothy Lynch, female   DOB: 24-Mar-1961, 56 y.o.   MRN: [id number]  HPI Acute visit. Patient seen with onset 2 days ago fairly abrupt headache, generalized weakness and fatigue, cough, myalgias, and fever. She's had decreased appetite. She was traveling last week with flying. Did not have flu vaccine. No vomiting. No diarrhea. No dyspnea.  Past Medical History:  Diagnosis Date  . Allergy   . Anemia 07/2012  . Anxiety   . Cancer (HCC)    squamous cell skin cancer-shoulder x 3  . DDD (degenerative disc disease)   . Depression   . Disease of vein   . Episodic low back pain   . Headache(784.0)   . History of bronchitis   . Hypothyroidism 07/2012  . Migraines   . PONV (postoperative nausea and vomiting)   . Scoliosis    Past Surgical History:  Procedure Laterality Date  . ABDOMINAL SURGERY  1995   inguinal hernia, double  . ACHILLES TENDON SURGERY  2001  . BREAST SURGERY  march 2011   breast [id number], fibrocyctic changes  . COLONOSCOPY    . FACIAL COSMETIC SURGERY  2012  . TOTAL HIP ARTHROPLASTY Left 01/24/2015   Procedure: TOTAL HIP ARTHROPLASTY ANTERIOR APPROACH;  Surgeon: Dorna Leitz, MD;  Location: [place];  Service: Orthopedics;  Laterality: Left;  . Vein injections      reports that she has never smoked. She has never used smokeless tobacco. She reports that she drinks about 4.2 oz of alcohol per week . She reports that she does not use drugs. family history includes Alcohol abuse in her brother; Breast cancer (age of onset: 48) in her mother; CVA in her brother; Cancer (age of onset: 66) in her maternal grandmother; Diabetes in her father; Heart disease in her paternal grandmother; Hypertension in her father; Stroke in her maternal grandfather; Stroke (age of onset: 24) in her father; Thyroid disease in her mother. Allergies  Allergen Reactions  . Penicillins Other (See Comments)    Headaches   . Sulfonamide Derivatives Other (See Comments)   Migraines     Review of Systems  Constitutional: Positive for chills, fatigue and fever.  HENT: Positive for congestion and sore throat.   Respiratory: Positive for cough. Negative for shortness of breath.   Neurological: Positive for headaches.       Objective:   Physical Exam  Constitutional: She appears well-developed and well-nourished.  HENT:  Right Ear: External ear normal.  Left Ear: External ear normal.  Mouth/Throat: Oropharynx is clear and moist.  Neck: Neck supple.  Cardiovascular: Normal rate and regular rhythm.   Pulmonary/Chest: Effort normal and breath sounds normal. No respiratory distress. She has no wheezes. She has no rales.  Neurological: She is alert.  Skin: No rash noted.       Assessment:     Influenza. Positive influenza A. Patient nontoxic in appearance    Plan:     -Start Tamiflu 75 mg twice a day for 5 days -Push fluids. Continue Tylenol or Advil as needed for body aches -Follow-up immediately for any increased shortness of breath, vomiting, or other concerns.  Eulas Post MD [facility]

## 2016-07-07 NOTE — Progress Notes (Signed)
Pre visit review using our clinic review tool, if applicable. No additional management support is needed unless otherwise documented below in the visit note. 

## 2016-07-07 NOTE — Patient Instructions (Signed)

## 2016-07-10 ENCOUNTER — Other Ambulatory Visit: Payer: Self-pay | Admitting: Family Medicine

## 2016-09-14 ENCOUNTER — Other Ambulatory Visit: Payer: Self-pay

## 2016-09-14 MED ORDER — FEXOFENADINE HCL 180 MG PO TABS
180.0000 mg | ORAL_TABLET | Freq: Every day | ORAL | 6 refills | Status: DC
Start: 2016-09-14 — End: 2017-10-04

## 2016-09-14 NOTE — Telephone Encounter (Signed)
Called into pharmacy

## 2016-10-14 ENCOUNTER — Encounter: Payer: Self-pay | Admitting: Family Medicine

## 2016-10-14 ENCOUNTER — Ambulatory Visit (INDEPENDENT_AMBULATORY_CARE_PROVIDER_SITE_OTHER): Payer: PRIVATE HEALTH INSURANCE | Admitting: Family Medicine

## 2016-10-14 VITALS — BP 126/72 | HR 64 | Temp 97.8°F | Ht 69.0 in | Wt 159.6 lb

## 2016-10-14 DIAGNOSIS — E039 Hypothyroidism, unspecified: Secondary | ICD-10-CM

## 2016-10-14 DIAGNOSIS — Z79899 Other long term (current) drug therapy: Secondary | ICD-10-CM

## 2016-10-14 DIAGNOSIS — F3181 Bipolar II disorder: Secondary | ICD-10-CM | POA: Diagnosis not present

## 2016-10-14 LAB — COMPREHENSIVE METABOLIC PANEL
ALBUMIN: 3.8 g/dL (ref 3.5–5.2)
ALK PHOS: 49 U/L (ref 39–117)
ALT: 14 U/L (ref 0–35)
AST: 14 U/L (ref 0–37)
BILIRUBIN TOTAL: 0.5 mg/dL (ref 0.2–1.2)
BUN: 13 mg/dL (ref 6–23)
CO2: 24 mEq/L (ref 19–32)
CREATININE: 0.88 mg/dL (ref 0.40–1.20)
Calcium: 8.7 mg/dL (ref 8.4–10.5)
Chloride: 104 mEq/L (ref 96–112)
GFR: 70.31 mL/min (ref >60.00)
Glucose, Bld: 80 mg/dL (ref 70–99)
Potassium: 4.3 mEq/L (ref 3.5–5.1)
SODIUM: 136 meq/L (ref 135–145)
Total Protein: 6.2 g/dL (ref 6.0–8.3)

## 2016-10-14 LAB — CBC
HCT: 36.9 % (ref 36.0–46.0)
Hemoglobin: 12.4 g/dL (ref 12.0–15.0)
MCHC: 33.7 g/dL (ref 30.0–36.0)
MCV: 96.1 fl (ref 78.0–100.0)
Platelets: 249 10*3/uL (ref 150.0–400.0)
RBC: 3.84 Mil/uL — AB (ref 3.87–5.11)
RDW: 13 % (ref 11.5–15.5)
WBC: 5.3 10*3/uL (ref 4.0–10.5)

## 2016-10-14 LAB — TSH: TSH: 1.4 u[IU]/mL (ref 0.35–4.50)

## 2016-10-14 NOTE — Progress Notes (Signed)
Subjective:  Dorothy Lynch is a 56 y.o. year old very pleasant female patient who presents for/with See problem oriented charting ROS- No Si. No chest pain or shortness of breath. No headache or blurry vision.    Past Medical History-  Patient Active Problem List   Diagnosis Date Noted  . Bipolar II disorder ([place]) 01/18/2011    Priority: High  . Menopausal symptoms 10/14/2015    Priority: Medium  . Hypothyroidism 10/14/2015    Priority: Medium  . Insomnia 01/18/2011    Priority: Medium  . Allergic rhinitis 10/14/2015    Priority: Low  . Yeast infection 10/14/2015    Priority: Low  . Primary osteoarthritis of left hip 01/24/2015    Priority: Low  . History of squamous cell carcinoma of skin     Priority: Low    Medications- reviewed and updated Current Outpatient Prescriptions  Medication Sig Dispense Refill  . BIOTIN PO Take 1 tablet by mouth daily.     . cholecalciferol (VITAMIN D) 1000 UNITS tablet Take 1,000 Units by mouth daily.    Marland Kitchen escitalopram (LEXAPRO) 10 MG tablet Take 1 tablet (10 mg total) by mouth daily. 90 tablet 3  . estradiol (ESTRACE) 1 MG tablet Take 3 tablets (3 mg total) by mouth every evening. 90 tablet 11  . fexofenadine (ALLEGRA) 180 MG tablet Take 1 tablet (180 mg total) by mouth daily. 30 tablet 6  . fluticasone (FLONASE) 50 MCG/ACT nasal spray Use 2 sprays in each nostil at night  5  . Glucosamine HCl (GLUCOSAMINE PO) Take 1 tablet by mouth daily.    Marland Kitchen lamoTRIgine (LAMICTAL) 100 MG tablet TAKE 1 TABLET BY MOUTH 2 TIMES DAILY 180 tablet 2  . levothyroxine (SYNTHROID, LEVOTHROID) 75 MCG tablet TAKE 1 TABLET BY MOUTH EVERY DAY BEFORE BREAKFAST 90 tablet 3  . NONFORMULARY OR COMPOUNDED ITEM Boric acid suppository 600 mg once vaginally twice weekly 30 each 4  . Omega-3 Fatty Acids (FISH OIL PO) Take 1 capsule by mouth daily.     Marland Kitchen OVER THE COUNTER MEDICATION Apply 1 application topically as directed. Glucosamine cream    . Polyethyl Glycol-Propyl Glycol  (SYSTANE OP) Place 1 drop into both eyes daily as needed (for dry eyes).    . Probiotic Product (PROBIOTIC PO) Take 1 tablet by mouth daily.     . progesterone (PROMETRIUM) 100 MG capsule Take 1 capsule (100 mg total) by mouth at bedtime. 30 capsule 11  . terconazole (TERAZOL 7) 0.4 % vaginal cream Place 1 applicator vaginally at bedtime for 7 days 45 g 1  . tretinoin (RETIN-A) 0.05 % cream Apply topically at bedtime. 45 g 1  . venlafaxine XR (EFFEXOR-XR) 37.5 MG 24 hr capsule TAKE 1 CAPSULE by mouth EVERY DAY 90 capsule 3  . zolpidem (AMBIEN) 10 MG tablet TAKE 1 TABLET BY MOUTH AT BEDTIME AS NEEDED FOR SLEEP 30 tablet 5   No current facility-administered medications for this visit.     Objective: BP 126/72 (BP Location: Left Arm, Patient Position: Sitting, Cuff Size: Normal)   Pulse 64   Temp 97.8 F (36.6 C) (Oral)   Ht 5\' 9"  (1.753 m)   Wt 159 lb 9.6 oz (72.4 kg)   LMP 09/28/2008   SpO2 96%   BMI 23.57 kg/m  Gen: NAD, resting comfortably No obvious thyromegaly CV: RRR no murmurs rubs or gallops Lungs: CTAB no crackles, wheeze, rhonchi Abdomen: soft/nontender/nondistended/normal bowel sounds. thin Ext: no edema Skin: warm, dry Neuro: grossly normal, moves all  extremities  Assessment/Plan:  Hypothyroidism S: Lab Results  Component Value Date   TSH 1.35 01/08/2016   On thyroid medication-levothyroxine 75 mg A/P: update tsh today   Bipolar II disorder (HCC) Insomnia S: bipolar has been well controlled on combination of lexapro 10mg , effexor 37.5mg ,  and lamictal 100mg  BID. Dr .Florinda Marker of psychology does not believe her periods of being more active are hypomania- she does still require sleep with these. Still taking ambein 10mg - but cuts in half. Dr. Phineas Real prescribed this last as well as lexapro- still having some awakenings.  A/P:   Return in about 6 months (around 04/16/2017) for physical. She states no refills needed at this time.   Orders Placed This  Encounter  Procedures  . CBC    [place]  . Comprehensive metabolic panel    [place]  . TSH    [place]   Return precautions advised.  Garret Reddish, MD

## 2016-10-14 NOTE — Patient Instructions (Addendum)
Check with your insurance if they will cover a physical with both gynecology and here - most insurances do allow this.   Please stop by lab before you go

## 2016-10-14 NOTE — Assessment & Plan Note (Signed)
Insomnia S: bipolar has been well controlled on combination of lexapro 10mg , effexor 37.5mg ,  and lamictal 100mg  BID. Dr .Florinda Marker of psychology does not believe her periods of being more active are hypomania- she does still require sleep with these. Still taking ambein 10mg - but cuts in half. Dr. Phineas Real prescribed this last as well as lexapro- still having some awakenings.  A/P:

## 2016-10-14 NOTE — Assessment & Plan Note (Signed)
S: Lab Results  Component Value Date   TSH 1.35 01/08/2016   On thyroid medication-levothyroxine 75 mg A/P: update tsh today

## 2016-12-29 DIAGNOSIS — R87610 Atypical squamous cells of undetermined significance on cytologic smear of cervix (ASC-US): Secondary | ICD-10-CM

## 2016-12-29 HISTORY — DX: Atypical squamous cells of undetermined significance on cytologic smear of cervix (ASC-US): R87.610

## 2017-01-05 ENCOUNTER — Other Ambulatory Visit: Payer: Self-pay | Admitting: Gynecology

## 2017-01-10 ENCOUNTER — Ambulatory Visit (INDEPENDENT_AMBULATORY_CARE_PROVIDER_SITE_OTHER): Payer: PRIVATE HEALTH INSURANCE | Admitting: Gynecology

## 2017-01-10 ENCOUNTER — Encounter: Payer: Self-pay | Admitting: Gynecology

## 2017-01-10 VITALS — BP 118/76 | Ht 69.0 in | Wt 154.0 lb

## 2017-01-10 DIAGNOSIS — N951 Menopausal and female climacteric states: Secondary | ICD-10-CM | POA: Diagnosis not present

## 2017-01-10 DIAGNOSIS — L659 Nonscarring hair loss, unspecified: Secondary | ICD-10-CM

## 2017-01-10 DIAGNOSIS — Z01411 Encounter for gynecological examination (general) (routine) with abnormal findings: Secondary | ICD-10-CM | POA: Diagnosis not present

## 2017-01-10 DIAGNOSIS — N950 Postmenopausal bleeding: Secondary | ICD-10-CM | POA: Diagnosis not present

## 2017-01-10 DIAGNOSIS — N952 Postmenopausal atrophic vaginitis: Secondary | ICD-10-CM

## 2017-01-10 DIAGNOSIS — Z79890 Hormone replacement therapy (postmenopausal): Secondary | ICD-10-CM | POA: Diagnosis not present

## 2017-01-10 MED ORDER — ESCITALOPRAM OXALATE 10 MG PO TABS: 10.0000 mg | ORAL_TABLET | Freq: Every day | ORAL | 3 refills | Status: DC

## 2017-01-10 MED ORDER — ZOLPIDEM TARTRATE 10 MG PO TABS: 10.0000 mg | ORAL_TABLET | Freq: Every evening | ORAL | 5 refills | Status: DC | PRN

## 2017-01-10 MED ORDER — VENLAFAXINE HCL ER 37.5 MG PO CP24: ORAL_CAPSULE | ORAL | 3 refills | Status: DC

## 2017-01-10 NOTE — Progress Notes (Signed)
Roan Sawchuk Jul 12, 1960 [id number]        56 y.o.  [id number] for annual exam.  Patient also complaining of several issues:  1. Postmenopausal bleeding on HRT. Patient currently taking estradiol 1 mg in the morning and 2 mg in the evening along with Prometrium 100 mg nightly. Over the past 6 months as noted bleeding on and off like regular menses each month. Also having sleep disturbances with hot flushes although she does have a history of chronic insomnia. 2. Alopecia. General hair loss with brushing and washing her hair. No areas of frank balding. Was discussed last year and seems to have persisted. Recently had thyroid checked by Dr. Yong Channel which was normal. Also a normal CBC. No other skin changes or nail changes.  Past medical history,surgical history, problem list, medications, allergies, family history and social history were all reviewed and documented as reviewed in the EPIC chart.  ROS:  Performed with pertinent positives and negatives included in the history, assessment and plan.   Additional significant findings :  None   Exam: Caryn Bee assistant Vitals:   01/10/17 1610  BP: 118/76  Weight: 154 lb (69.9 kg)  Height: 5\' 9"  (1.753 m)   Body mass index is 22.74 kg/m.  General appearance:  Normal affect, orientation and appearance. Skin: Grossly normal HEENT: Without gross lesions.  No cervical or supraclavicular adenopathy. Thyroid normal.  Lungs:  Clear without wheezing, rales or rhonchi Cardiac: RR, without RMG Abdominal:  Soft, nontender, without masses, guarding, rebound, organomegaly or hernia Breasts:  Examined lying and sitting without masses, retractions, discharge or axillary adenopathy. Pelvic:  Ext, BUS, Vagina: With mild atrophic changes. Light menses flow  Cervix: Grossly normal. Pap smear done  Uterus: Anteverted, normal size, shape and contour, midline and mobile nontender   Adnexa: Without masses or tenderness    Anus and perineum: Normal    Rectovaginal: Normal sphincter tone without palpated masses or tenderness.    Assessment/Plan:  56 y.o. [id number] female for annual exam.   1. Postmenopausal bleeding on HRT. Reviewed differential with the patient to include atrophic changes, polyps, hyperplastic with and without atypia and endometrial cancer. I checked a baseline FSH now although I discussed with her being on HRT could factitiously alter the results. Recommended sonohysterogram for endometrial assessment and sampling if needed. Stressed the need for follow up given the above differential. Patient agrees to schedule an follow up for this. She does have enough HRT at home to continue through her appointment for the sonohysterogram. I reviewed with her that after the sonohysterogram and we clear her endometrium we can discuss her hormone replacement regimen and make adjustments as needed. 2. Alopecia. We discussed this last year. Her exam shows no areas of balding or thinning hair. Recent thyroid checked normal. I recommended she follow up with dermatology if this continues to concern her. 3. Anxiety/depression. She continues on Effexor XR 37.5 mg and Lexapro 10 mg daily. Is getting good results with this. Currently under counseling through her church. I recommended that she really needs to be followed by mental health provider such as a psychiatrist or psychologist to be out of coordinate all of her medications. She is doing well with this combination and I refilled her. 4. Insomnia. Difficult to separate how much is due to the hot flushes and primary insomnia. She has been battling this for years. Uses Ambien 1/2 mg nightly as needed. #30 with 5 refills provided.  5. Hypothyroid. Is following up with Dr. Yong Channel  in reference to this. 6. Mammography 05/2016. Continue with annual mammography when due. Rest exam normal today. 7. Pap smear 2016. Pap smear done today. No history of abnormal Pap smears previously. 8. DEXA never. Will plan  further into the menopause. 9. Colonoscopy 2011. Repeat at their recommended interval. 10. Health maintenance. No routine lab work done as patient reports that she is going to have this all done through Dr. Ansel Bong office. Follow up for sonohysterogram.  Additional time in excess of her routine gynecologic exam was spent in direct face to face counseling and coordination of care in regards to her postmenopausal bleeding, postmenopausal symptoms, alopecia.    Anastasio Auerbach MD, 5:00 PM 01/10/2017

## 2017-01-10 NOTE — Patient Instructions (Signed)
Follow-up for the ultrasound as scheduled. 

## 2017-01-11 ENCOUNTER — Other Ambulatory Visit: Payer: Self-pay | Admitting: Gynecology

## 2017-01-11 DIAGNOSIS — N950 Postmenopausal bleeding: Secondary | ICD-10-CM

## 2017-01-14 LAB — PAP IG W/ RFLX HPV ASCU

## 2017-01-19 ENCOUNTER — Other Ambulatory Visit: Payer: Self-pay | Admitting: Gynecology

## 2017-01-19 ENCOUNTER — Ambulatory Visit (INDEPENDENT_AMBULATORY_CARE_PROVIDER_SITE_OTHER): Payer: PRIVATE HEALTH INSURANCE

## 2017-01-19 ENCOUNTER — Encounter: Payer: Self-pay | Admitting: Gynecology

## 2017-01-19 ENCOUNTER — Ambulatory Visit (INDEPENDENT_AMBULATORY_CARE_PROVIDER_SITE_OTHER): Payer: PRIVATE HEALTH INSURANCE | Admitting: Gynecology

## 2017-01-19 VITALS — BP 116/74

## 2017-01-19 DIAGNOSIS — N950 Postmenopausal bleeding: Secondary | ICD-10-CM

## 2017-01-19 DIAGNOSIS — Z79890 Hormone replacement therapy (postmenopausal): Secondary | ICD-10-CM

## 2017-01-19 DIAGNOSIS — D251 Intramural leiomyoma of uterus: Secondary | ICD-10-CM

## 2017-01-19 DIAGNOSIS — R938 Abnormal findings on diagnostic imaging of other specified body structures: Secondary | ICD-10-CM

## 2017-01-19 DIAGNOSIS — N852 Hypertrophy of uterus: Secondary | ICD-10-CM

## 2017-01-19 DIAGNOSIS — N9489 Other specified conditions associated with female genital organs and menstrual cycle: Secondary | ICD-10-CM

## 2017-01-19 DIAGNOSIS — Z124 Encounter for screening for malignant neoplasm of cervix: Secondary | ICD-10-CM

## 2017-01-19 DIAGNOSIS — R9389 Abnormal findings on diagnostic imaging of other specified body structures: Secondary | ICD-10-CM

## 2017-01-19 LAB — FOLLICLE STIMULATING HORMONE: FSH: 14.3 m[IU]/mL

## 2017-01-19 NOTE — Patient Instructions (Signed)
Office will call you with the biopsy results.  Office will call you to arrange surgery.

## 2017-01-19 NOTE — Progress Notes (Signed)
    Dorothy Lynch 01/14/1961 [id number]        56 y.o.  [id number] presents for sonohysterogram. History of irregular bleeding over the past 6 months on HRT. Also to have her Pap smear repeated as it was inadequate at her last exam.  Past medical history,surgical history, problem list, medications, allergies, family history and social history were all reviewed and documented in the EPIC chart.  Directed ROS with pertinent positives and negatives documented in the history of present illness/assessment and plan.  Exam: [place] assistant Vitals:   01/19/17 0941  BP: 116/74   General appearance:  Normal Abdomen soft nontender without masses guarding rebound Pelvic external BUS vagina normal. Cervix normal. Pap smear done. Uterus normal size midline mobile nontender. Adnexa without masses or tenderness.  Ultrasound: Transvaginal and transabdominal shows uterus anteverted with several small myomas measuring 44 mm, 18 mm, 15 mm. Endometrial echo 8.4 mm. Right and left ovaries normal and atrophic in appearance. Cul-de-sac negative.  Sonohysterogram: The cervix was prepared with Betadine and the catheter was easily introduced with good distention and 2 endometrial defects noted 15 x 6 x 12 mm and 5 x 6 mm consistent with endometrial polyps. An endometrial sample was taken. The patient tolerated well.  Assessment/Plan:  56 y.o. [id number] with postmenopausal bleeding on HRT. Sonohysterogram consistent with endometrial polyps. I reviewed the situation with the patient and my recommendation to proceed with hysteroscopy, D&C with resection of the endometrial polyps. I reviewed what is involved with the procedure to include the use of the hysteroscope and the D&C portion. Risks to include infection, transfusion, injury to internal organs including vagina, cervix, uterus and possible perforation with internal organs damage such as bowel, bladder, ureters, nerves, vessels either immediately recognized or delay  recognized necessitating major exploratory reparative surgeries and future reparative surgeries including ostomy formation was reviewed. No guarantees about relief of her bleeding and that this may continue following the procedure. Lastly differential to include benign versus malignant also reviewed. The patient will schedule the procedure at her convenience and will follow up for this. She'll follow up for the biopsy results from the ultrasound and the Pap smear. She did have an [place] drawn today.    Anastasio Auerbach MD, 9:55 AM 01/19/2017

## 2017-01-19 NOTE — Addendum Note (Signed)
Addended by: Nelva Nay on: 01/19/2017 10:14 AM   Modules accepted: Orders

## 2017-01-20 LAB — PAP IG W/ RFLX HPV ASCU

## 2017-01-21 ENCOUNTER — Telehealth: Payer: Self-pay | Admitting: *Deleted

## 2017-01-21 LAB — HUMAN PAPILLOMAVIRUS, HIGH RISK: HPV DNA HIGH RISK: NOT DETECTED

## 2017-01-21 NOTE — Telephone Encounter (Signed)
(  pt aware you are out of the office) Pt called informed with [facility] result asked what should she do about the hot flashes? Please advise

## 2017-01-23 ENCOUNTER — Encounter: Payer: Self-pay | Admitting: Gynecology

## 2017-01-23 NOTE — Telephone Encounter (Signed)
Keep taking hormones as prescribed.  After D&C and pathology back we can adjust hormones if needed

## 2017-01-24 ENCOUNTER — Encounter: Payer: Self-pay | Admitting: *Deleted

## 2017-01-24 ENCOUNTER — Other Ambulatory Visit: Payer: Self-pay | Admitting: Gynecology

## 2017-01-24 ENCOUNTER — Telehealth: Payer: Self-pay

## 2017-01-24 IMAGING — CR DG CHEST 2V
2 series · 2 of 2 positions shown · non-contrast
Comparison: None.

CLINICAL DATA: Left hip replacement.  Preoperative exam .

EXAM:
CHEST  2 VIEW

[w chest pa]
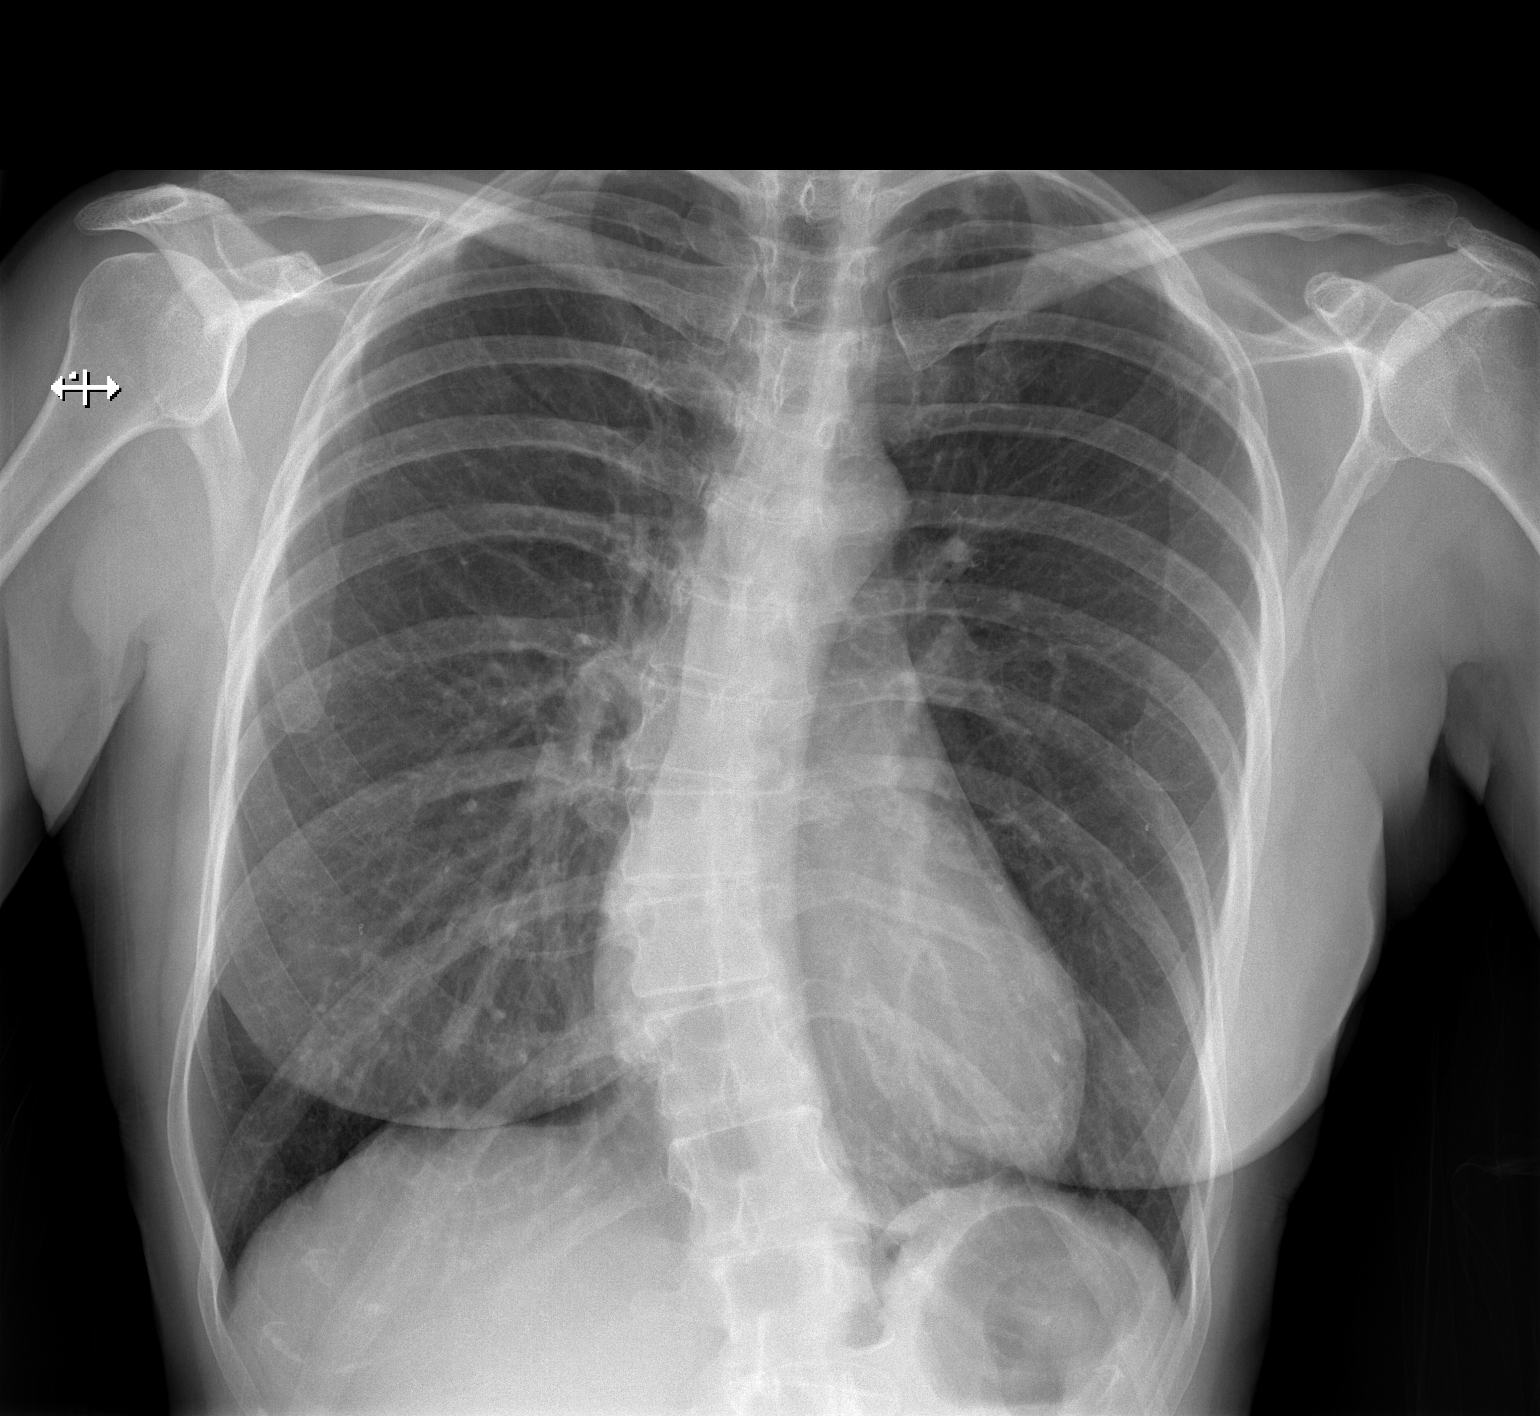

[w chest lat]
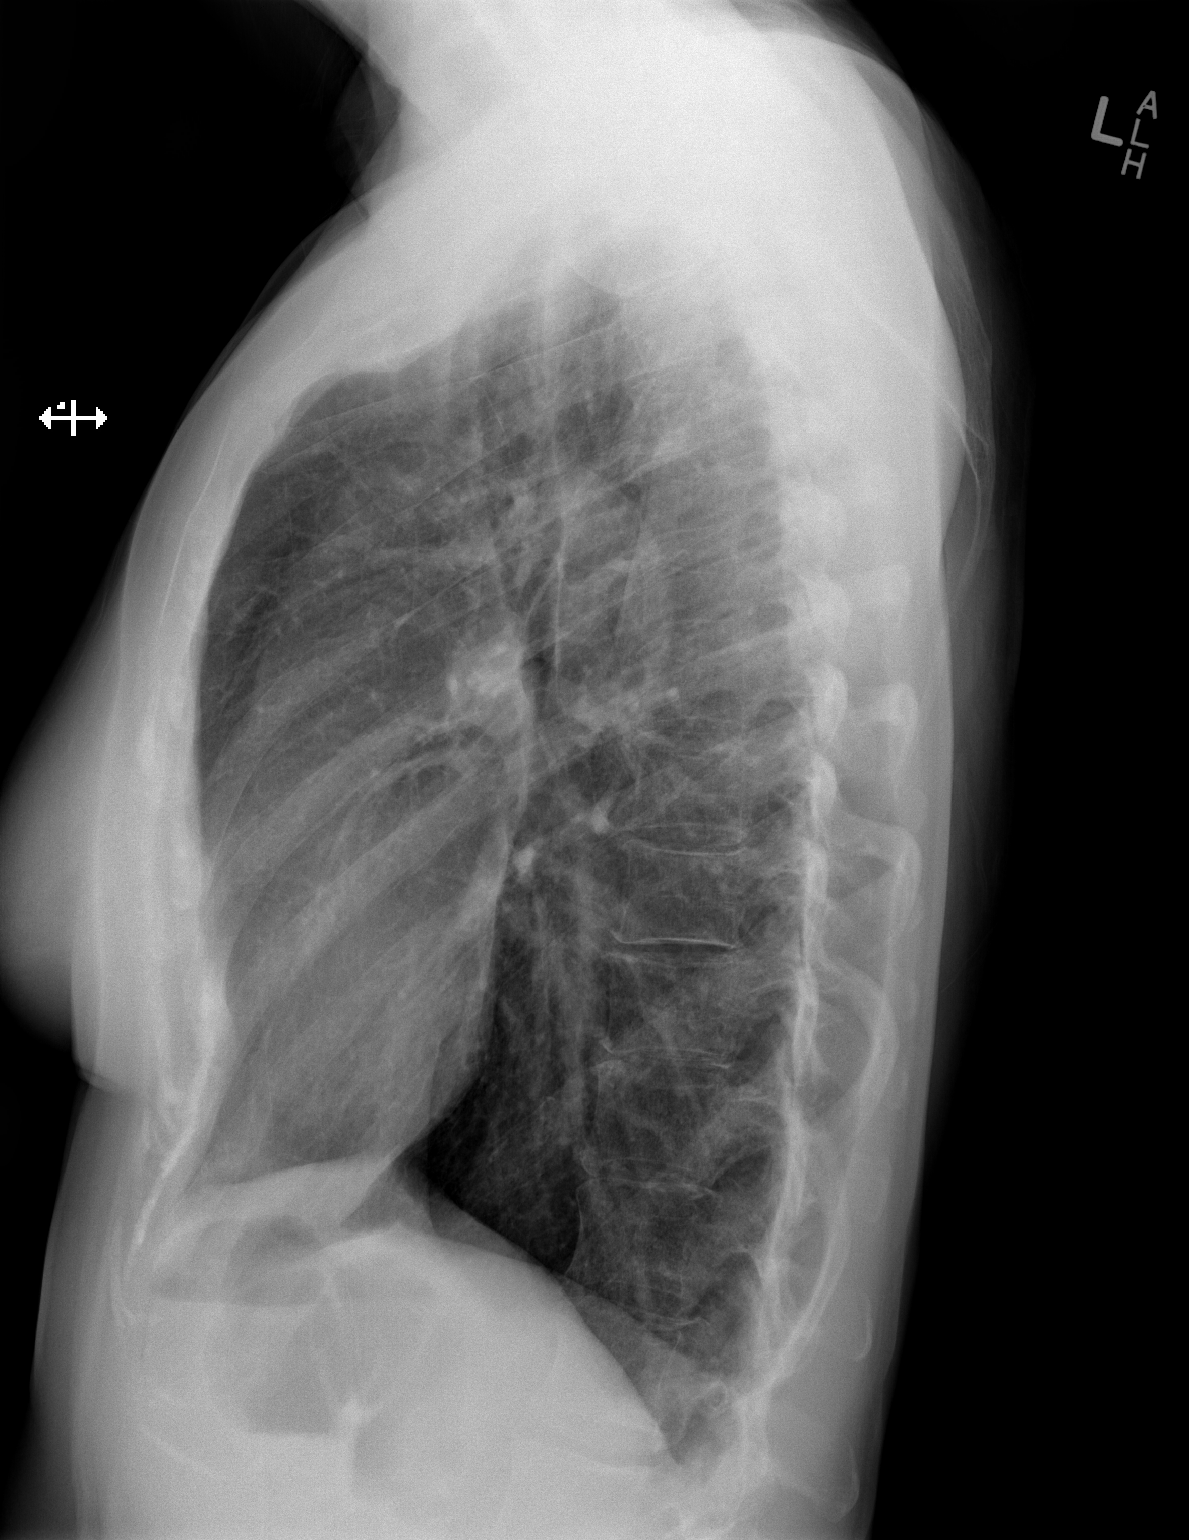

[2 of 2 positions shown; findings below may reference images not displayed]

FINDINGS: Mediastinum hilar structures normal. Lungs are clear. Heart size
normal. No pleural effusion or pneumothorax. Biapical pleural
parenchymal thickening noted most consistent with scarring.
Prominent thoracolumbar spine scoliosis. No acute abnormality .
IMPRESSION: 1. No acute cardiopulmonary disease. Biapical pleural parenchymal
thickening noted most consistent with scarring.
2. Prominent thoracolumbar spine scoliosis.

## 2017-01-24 MED ORDER — MISOPROSTOL 200 MCG PO TABS: ORAL_TABLET | ORAL | 0 refills | Status: DC

## 2017-01-24 NOTE — Telephone Encounter (Signed)
Sent pt mychart message

## 2017-01-24 NOTE — Telephone Encounter (Signed)
Spoke with patient and scheduled her for surgery On 02/21/17 at 7:30am at [facility].. Advised her regarding need for CYtotec tab night before surgery and Rx was sent.  Pre op appt was scheduled. [facility] pamphlet mailed to patient.

## 2017-02-09 ENCOUNTER — Ambulatory Visit (INDEPENDENT_AMBULATORY_CARE_PROVIDER_SITE_OTHER): Payer: PRIVATE HEALTH INSURANCE | Admitting: Gynecology

## 2017-02-09 ENCOUNTER — Encounter: Payer: Self-pay | Admitting: Gynecology

## 2017-02-09 VITALS — BP 120/82

## 2017-02-09 DIAGNOSIS — Z79890 Hormone replacement therapy (postmenopausal): Secondary | ICD-10-CM

## 2017-02-09 DIAGNOSIS — N950 Postmenopausal bleeding: Secondary | ICD-10-CM

## 2017-02-09 DIAGNOSIS — N840 Polyp of corpus uteri: Secondary | ICD-10-CM | POA: Diagnosis not present

## 2017-02-09 NOTE — Patient Instructions (Signed)
Followup for surgery as scheduled. 

## 2017-02-09 NOTE — Progress Notes (Signed)
Dorothy Lynch 10/23/1960 [id number]   Preoperative consult  Chief complaint: Postmenopausal bleeding on HRT, endometrial polyp  History of present illness: 55 y.o. G3P0012 on HRT to consist of estradiol 1 mg in the morning and 2 mg in the evening and Prometrium 100 mg nightly. Has been bleeding on and off over the last 6 months.  Sonohysterogram performed which showed multiple small myomas measuring 44, 18 and 15 mm. Endometrial echo 8.4 mm. Right left ovaries normal in atrophic in appearance. Endometrial cavity showed 2 defects measuring 15 x 6 x 12 mm and 5 x 6 mm consistent with endometrial polyps. Endometrial biopsy showed inactive endometrium. Patient is planned for hysteroscopy, D&C with resection of her endometrial polyps.  Past medical history,surgical history, medications, allergies, family history and social history were all reviewed and documented in the EPIC chart.  ROS:  Was performed and pertinent positives and negatives are included in the history of present illness.  Exam:  Wandra Scot assistant Vitals:   02/09/17 1621  BP: 120/82   General: well developed, well nourished female, no acute distress HEENT: normal  Lungs: clear to auscultation without wheezing, rales or rhonchi  Cardiac: regular rate without rubs, murmurs or gallops  Abdomen: soft, nontender without masses, guarding, rebound, organomegaly  Pelvic: external bus vagina: with atrophic changes   Cervix: grossly normal Uterus: anteverted, normal size midline mobile nontender  Adnexa: no masses or tenderness  Rectovaginal exam within normal limits    Assessment/Plan:  56 y.o. [id number] with postmenopausal bleeding for 6 months duration on HRT. Sonohysterogram consistent with 2 endometrial polyps. Endometrial biopsy showed inactive endometrium. Patient planned for hysteroscopy D&C with resection of the endometrial polyps. Differential was discussed with the patient to include both benign and malignant possibilities.  I  reviewed the proposed surgery with the patient to include the expected intraoperative and postoperative courses as well as the recovery period. The use of the hysteroscope, resectoscope and the D&C portion were all discussed. The risks of surgery to include infection, prolonged antibiotics, hemorrhage necessitating transfusion and the risks of transfusion, including transfusion reaction, hepatitis, HIV, mad cow disease and other unknown entities were all discussed understood and accepted. The risk of damage to internal organs during the procedure, either immediately recognized or delay recognized, including vagina, cervix, uterus, possible perforation causing damage to bowel, bladder, ureters, vessels and nerves necessitating major exploratory reparative surgery and future reparative surgeries including bladder repair, ureteral damage repair, bowel resection, ostomy formation was also discussed understood and accepted. She understands there are no guarantees that this will relieve her irregular bleeding which may continue or get worse. The patient's questions were answered to her satisfaction and she is ready to proceed with surgery.      Anastasio Auerbach MD, 4:53 PM 02/09/2017

## 2017-02-09 NOTE — H&P (Signed)
Dorothy Lynch Aug 11, 1960 [id number]   History and Physical  Chief complaint: Postmenopausal bleeding on HRT, endometrial polyp  History of present illness: 56 y.o. G3P0012 on HRT to consist of estradiol 1 mg in the morning and 2 mg in the evening and Prometrium 100 mg nightly. Has been bleeding on and off over the last 6 months.  Sonohysterogram performed which showed multiple small myomas measuring 44, 18 and 15 mm. Endometrial echo 8.4 mm. Right left ovaries normal in atrophic in appearance. Endometrial cavity showed 2 defects measuring 15 x 6 x 12 mm and 5 x 6 mm consistent with endometrial polyps. Endometrial biopsy showed inactive endometrium. Patient is planned for hysteroscopy, D&C with resection of her endometrial polyps.  Past medical history,surgical history, medications, allergies, family history and social history were all reviewed and documented in the EPIC chart.  ROS:  Was performed and pertinent positives and negatives are included in the history of present illness.  Exam:  Dorothy Lynch assistant Vitals:   02/09/17 1621  BP: 120/82   General: well developed, well nourished female, no acute distress HEENT: normal  Lungs: clear to auscultation without wheezing, rales or rhonchi  Cardiac: regular rate without rubs, murmurs or gallops  Abdomen: soft, nontender without masses, guarding, rebound, organomegaly  Pelvic: external bus vagina: with atrophic changes   Cervix: grossly normal Uterus: anteverted, normal size midline mobile nontender  Adnexa: no masses or tenderness  Rectovaginal exam within normal limits    Assessment/Plan:  56 y.o. [id number] with postmenopausal bleeding for 6 months duration on HRT. Sonohysterogram consistent with 2 endometrial polyps. Endometrial biopsy showed inactive endometrium. Patient planned for hysteroscopy D&C with resection of the endometrial polyps. Differential was discussed with the patient to include both benign and malignant possibilities.  I  reviewed the proposed surgery with the patient to include the expected intraoperative and postoperative courses as well as the recovery period. The use of the hysteroscope, resectoscope and the D&C portion were all discussed. The risks of surgery to include infection, prolonged antibiotics, hemorrhage necessitating transfusion and the risks of transfusion, including transfusion reaction, hepatitis, HIV, mad cow disease and other unknown entities were all discussed understood and accepted. The risk of damage to internal organs during the procedure, either immediately recognized or delay recognized, including vagina, cervix, uterus, possible perforation causing damage to bowel, bladder, ureters, vessels and nerves necessitating major exploratory reparative surgery and future reparative surgeries including bladder repair, ureteral damage repair, bowel resection, ostomy formation was also discussed understood and accepted. She understands there are no guarantees that this will relieve her irregular bleeding which may continue or get worse. The patient's questions were answered to her satisfaction and she is ready to proceed with surgery.     Anastasio Auerbach MD, 5:00 PM 02/09/2017

## 2017-02-15 ENCOUNTER — Encounter (HOSPITAL_BASED_OUTPATIENT_CLINIC_OR_DEPARTMENT_OTHER): Payer: Self-pay | Admitting: *Deleted

## 2017-02-15 ENCOUNTER — Other Ambulatory Visit: Payer: Self-pay | Admitting: Gynecology

## 2017-02-15 NOTE — Progress Notes (Signed)
NPO AFTER MN.  ARRIVE AT 0600.  GETTING LAB WORK DONE PRIOR DOS (CBC, CMET).  WILL TAKE LAMICTAL, SYNTHROID, AND ESTRADOIL AM DOS W/ SIPS OF WATER.

## 2017-02-18 DIAGNOSIS — F329 Major depressive disorder, single episode, unspecified: Secondary | ICD-10-CM | POA: Diagnosis not present

## 2017-02-18 DIAGNOSIS — N840 Polyp of corpus uteri: Secondary | ICD-10-CM | POA: Diagnosis not present

## 2017-02-18 DIAGNOSIS — N950 Postmenopausal bleeding: Secondary | ICD-10-CM | POA: Diagnosis present

## 2017-02-18 DIAGNOSIS — Z79899 Other long term (current) drug therapy: Secondary | ICD-10-CM | POA: Diagnosis not present

## 2017-02-18 DIAGNOSIS — F419 Anxiety disorder, unspecified: Secondary | ICD-10-CM | POA: Diagnosis not present

## 2017-02-18 DIAGNOSIS — Z79890 Hormone replacement therapy (postmenopausal): Secondary | ICD-10-CM | POA: Diagnosis not present

## 2017-02-18 DIAGNOSIS — G4700 Insomnia, unspecified: Secondary | ICD-10-CM | POA: Diagnosis not present

## 2017-02-18 DIAGNOSIS — E039 Hypothyroidism, unspecified: Secondary | ICD-10-CM | POA: Diagnosis not present

## 2017-02-18 LAB — COMPREHENSIVE METABOLIC PANEL
ALBUMIN: 3.7 g/dL (ref 3.5–5.0)
ALT: 18 U/L (ref 14–54)
ANION GAP: 8 (ref 5–15)
AST: 18 U/L (ref 15–41)
Alkaline Phosphatase: 62 U/L (ref 38–126)
BILIRUBIN TOTAL: 0.6 mg/dL (ref 0.3–1.2)
BUN: 13 mg/dL (ref 6–20)
CALCIUM: 8.6 mg/dL — AB (ref 8.9–10.3)
CHLORIDE: 97 mmol/L — AB (ref 101–111)
CO2: 26 mmol/L (ref 22–32)
CREATININE: 0.84 mg/dL (ref 0.44–1.00)
GFR calc Af Amer: >60 mL/min (ref >60)
GFR calc non Af Amer: >60 mL/min (ref >60)
Glucose, Bld: 93 mg/dL (ref 65–99)
POTASSIUM: 4.4 mmol/L (ref 3.5–5.1)
SODIUM: 131 mmol/L — AB (ref 135–145)
TOTAL PROTEIN: 6.5 g/dL (ref 6.5–8.1)

## 2017-02-18 LAB — CBC
HEMATOCRIT: 36.1 % (ref 36.0–46.0)
Hemoglobin: 12 g/dL (ref 12.0–15.0)
MCH: 31.3 pg (ref 26.0–34.0)
MCHC: 33.2 g/dL (ref 30.0–36.0)
MCV: 94.3 fL (ref 78.0–100.0)
PLATELETS: 226 10*3/uL (ref 150–400)
RBC: 3.83 MIL/uL — ABNORMAL LOW (ref 3.87–5.11)
RDW: 12.8 % (ref 11.5–15.5)
WBC: 6.4 10*3/uL (ref 4.0–10.5)

## 2017-02-21 ENCOUNTER — Ambulatory Visit (HOSPITAL_BASED_OUTPATIENT_CLINIC_OR_DEPARTMENT_OTHER)
Admission: RE | Admit: 2017-02-21 | Discharge: 2017-02-21 | Disposition: A | Discharge status: Home / Self Care | Payer: PRIVATE HEALTH INSURANCE | Source: Ambulatory Visit | Attending: Gynecology | Admitting: Gynecology

## 2017-02-21 ENCOUNTER — Ambulatory Visit (HOSPITAL_BASED_OUTPATIENT_CLINIC_OR_DEPARTMENT_OTHER): Payer: PRIVATE HEALTH INSURANCE | Admitting: Anesthesiology

## 2017-02-21 ENCOUNTER — Encounter (HOSPITAL_BASED_OUTPATIENT_CLINIC_OR_DEPARTMENT_OTHER): Payer: Self-pay | Admitting: *Deleted

## 2017-02-21 ENCOUNTER — Encounter (HOSPITAL_BASED_OUTPATIENT_CLINIC_OR_DEPARTMENT_OTHER)
Admission: RE | Disposition: A | Discharge status: Home / Self Care | Payer: Self-pay | Source: Ambulatory Visit | Attending: Gynecology

## 2017-02-21 DIAGNOSIS — N840 Polyp of corpus uteri: Secondary | ICD-10-CM | POA: Insufficient documentation

## 2017-02-21 DIAGNOSIS — E039 Hypothyroidism, unspecified: Secondary | ICD-10-CM | POA: Insufficient documentation

## 2017-02-21 DIAGNOSIS — F329 Major depressive disorder, single episode, unspecified: Secondary | ICD-10-CM | POA: Insufficient documentation

## 2017-02-21 DIAGNOSIS — Z79899 Other long term (current) drug therapy: Secondary | ICD-10-CM | POA: Insufficient documentation

## 2017-02-21 DIAGNOSIS — F419 Anxiety disorder, unspecified: Secondary | ICD-10-CM | POA: Insufficient documentation

## 2017-02-21 DIAGNOSIS — N950 Postmenopausal bleeding: Secondary | ICD-10-CM | POA: Insufficient documentation

## 2017-02-21 DIAGNOSIS — Z79890 Hormone replacement therapy (postmenopausal): Secondary | ICD-10-CM | POA: Insufficient documentation

## 2017-02-21 DIAGNOSIS — G4700 Insomnia, unspecified: Secondary | ICD-10-CM | POA: Insufficient documentation

## 2017-02-21 HISTORY — DX: Bipolar II disorder: F31.81

## 2017-02-21 HISTORY — PX: DILATATION & CURETTAGE/HYSTEROSCOPY WITH MYOSURE: SHX6511

## 2017-02-21 HISTORY — DX: Asymptomatic varicose veins of unspecified lower extremity: I83.90

## 2017-02-21 HISTORY — DX: Polyp of corpus uteri: N84.0

## 2017-02-21 HISTORY — DX: Postmenopausal bleeding: N95.0

## 2017-02-21 HISTORY — DX: Venous insufficiency (chronic) (peripheral): I87.2

## 2017-02-21 HISTORY — DX: Other specified postprocedural states: Z85.9

## 2017-02-21 HISTORY — DX: Other specified postprocedural states: Z85.828

## 2017-02-21 HISTORY — DX: Unspecified osteoarthritis, unspecified site: M19.90

## 2017-02-21 HISTORY — DX: Other specified postprocedural states: Z98.890

## 2017-02-21 SURGERY — DILATATION & CURETTAGE/HYSTEROSCOPY WITH MYOSURE
Anesthesia: General | Site: Uterus

## 2017-02-21 MED ORDER — LIDOCAINE HCL 1 % IJ SOLN
INTRAMUSCULAR | Status: DC | PRN
  Administered 2017-02-21: 10 mL

## 2017-02-21 MED ORDER — METRONIDAZOLE IN NACL 5-0.79 MG/ML-% IV SOLN
500.0000 mg | INTRAVENOUS | Status: AC
  Administered 2017-02-21: 500 mg via INTRAVENOUS
  Filled 2017-02-21 (×2): qty 100

## 2017-02-21 MED ORDER — HYDROMORPHONE HCL 1 MG/ML IJ SOLN
0.2500 mg | INTRAMUSCULAR | Status: DC | PRN
  Filled 2017-02-21: qty 0.5

## 2017-02-21 MED ORDER — LIDOCAINE 2% (20 MG/ML) 5 ML SYRINGE
INTRAMUSCULAR | Status: AC
  Filled 2017-02-21: qty 5

## 2017-02-21 MED ORDER — KETOROLAC TROMETHAMINE 30 MG/ML IJ SOLN
INTRAMUSCULAR | Status: AC
  Filled 2017-02-21: qty 1

## 2017-02-21 MED ORDER — EPHEDRINE 5 MG/ML INJ
INTRAVENOUS | Status: AC
  Filled 2017-02-21: qty 10

## 2017-02-21 MED ORDER — SCOPOLAMINE 1 MG/3DAYS TD PT72
1.0000 | MEDICATED_PATCH | Freq: Once | TRANSDERMAL | Status: DC
  Filled 2017-02-21: qty 1

## 2017-02-21 MED ORDER — FENTANYL CITRATE (PF) 100 MCG/2ML IJ SOLN
INTRAMUSCULAR | Status: AC
  Filled 2017-02-21: qty 2

## 2017-02-21 MED ORDER — LIDOCAINE 2% (20 MG/ML) 5 ML SYRINGE
INTRAMUSCULAR | Status: DC | PRN
  Administered 2017-02-21: 80 mg via INTRAVENOUS

## 2017-02-21 MED ORDER — ARTIFICIAL TEARS OPHTHALMIC OINT
TOPICAL_OINTMENT | OPHTHALMIC | Status: AC
  Filled 2017-02-21: qty 3.5

## 2017-02-21 MED ORDER — ONDANSETRON HCL 4 MG/2ML IJ SOLN
INTRAMUSCULAR | Status: AC
  Filled 2017-02-21: qty 2

## 2017-02-21 MED ORDER — ONDANSETRON HCL 4 MG/2ML IJ SOLN
INTRAMUSCULAR | Status: DC | PRN
Start: 2017-02-21 — End: 2017-02-21
  Administered 2017-02-21: 4 mg via INTRAVENOUS

## 2017-02-21 MED ORDER — DEXAMETHASONE SODIUM PHOSPHATE 10 MG/ML IJ SOLN
INTRAMUSCULAR | Status: AC
  Filled 2017-02-21: qty 1

## 2017-02-21 MED ORDER — CIPROFLOXACIN IN D5W 400 MG/200ML IV SOLN
INTRAVENOUS | Status: AC
  Filled 2017-02-21: qty 200

## 2017-02-21 MED ORDER — PROPOFOL 10 MG/ML IV BOLUS
INTRAVENOUS | Status: DC | PRN
  Administered 2017-02-21: 150 mg via INTRAVENOUS

## 2017-02-21 MED ORDER — OXYCODONE-ACETAMINOPHEN 5-325 MG PO TABS: 1.0000 | ORAL_TABLET | ORAL | 0 refills | Status: DC | PRN

## 2017-02-21 MED ORDER — PROPOFOL 10 MG/ML IV BOLUS
INTRAVENOUS | Status: AC
  Filled 2017-02-21: qty 40

## 2017-02-21 MED ORDER — OXYCODONE HCL 5 MG PO TABS
5.0000 mg | ORAL_TABLET | Freq: Once | ORAL | Status: DC | PRN
  Filled 2017-02-21: qty 1

## 2017-02-21 MED ORDER — OXYCODONE HCL 5 MG/5ML PO SOLN
5.0000 mg | Freq: Once | ORAL | Status: DC | PRN
  Filled 2017-02-21: qty 5

## 2017-02-21 MED ORDER — LACTATED RINGERS IV SOLN
INTRAVENOUS | Status: DC
  Administered 2017-02-21: 07:00:00 via INTRAVENOUS
  Filled 2017-02-21: qty 1000

## 2017-02-21 MED ORDER — EPHEDRINE SULFATE 50 MG/ML IJ SOLN
INTRAMUSCULAR | Status: DC | PRN
  Administered 2017-02-21: 15 mg via INTRAVENOUS

## 2017-02-21 MED ORDER — CIPROFLOXACIN IN D5W 400 MG/200ML IV SOLN
400.0000 mg | INTRAVENOUS | Status: AC
  Administered 2017-02-21: 400 mg via INTRAVENOUS
  Filled 2017-02-21: qty 200

## 2017-02-21 MED ORDER — MIDAZOLAM HCL 2 MG/2ML IJ SOLN
INTRAMUSCULAR | Status: DC | PRN
  Administered 2017-02-21: 2 mg via INTRAVENOUS

## 2017-02-21 MED ORDER — SODIUM CHLORIDE 0.9 % IR SOLN
Status: DC | PRN
  Administered 2017-02-21: 3000 mL

## 2017-02-21 MED ORDER — PROMETHAZINE HCL 25 MG/ML IJ SOLN
6.2500 mg | INTRAMUSCULAR | Status: DC | PRN
  Filled 2017-02-21: qty 1

## 2017-02-21 MED ORDER — MIDAZOLAM HCL 2 MG/2ML IJ SOLN
INTRAMUSCULAR | Status: AC
  Filled 2017-02-21: qty 2

## 2017-02-21 MED ORDER — SILVER NITRATE-POT NITRATE 75-25 % EX MISC
CUTANEOUS | Status: DC | PRN
  Administered 2017-02-21: 2

## 2017-02-21 MED ORDER — MEPERIDINE HCL 25 MG/ML IJ SOLN
6.2500 mg | INTRAMUSCULAR | Status: DC | PRN
  Filled 2017-02-21: qty 1

## 2017-02-21 MED ORDER — DEXAMETHASONE SODIUM PHOSPHATE 10 MG/ML IJ SOLN
INTRAMUSCULAR | Status: DC | PRN
  Administered 2017-02-21: 10 mg via INTRAVENOUS

## 2017-02-21 MED ORDER — KETOROLAC TROMETHAMINE 30 MG/ML IJ SOLN
INTRAMUSCULAR | Status: DC | PRN
  Administered 2017-02-21: 30 mg via INTRAVENOUS

## 2017-02-21 SURGICAL SUPPLY — 15 items
CANISTER SUCT 3000ML PPV (MISCELLANEOUS) ×4 IMPLANT
CATH ROBINSON RED A/P 16FR (CATHETERS) ×2 IMPLANT
CLOTH BEACON ORANGE TIMEOUT ST (SAFETY) ×2 IMPLANT
COUNTER NEEDLE 1200 MAGNETIC (NEEDLE) ×2 IMPLANT
DEVICE MYOSURE REACH (MISCELLANEOUS) ×2 IMPLANT
GLOVE BIO SURGEON STRL SZ7.5 (GLOVE) ×4 IMPLANT
GOWN STRL REUS W/TWL LRG LVL3 (GOWN DISPOSABLE) ×2 IMPLANT
IV NS IRRIG 3000ML ARTHROMATIC (IV SOLUTION) ×2 IMPLANT
KIT RM TURNOVER CYSTO AR (KITS) ×2 IMPLANT
PACK VAGINAL MINOR WOMEN LF (CUSTOM PROCEDURE TRAY) ×2 IMPLANT
PAD OB MATERNITY 4.3X12.25 (PERSONAL CARE ITEMS) ×2 IMPLANT
SEAL ROD LENS SCOPE MYOSURE (ABLATOR) ×2 IMPLANT
TOWEL OR 17X24 6PK STRL BLUE (TOWEL DISPOSABLE) ×4 IMPLANT
TUBING AQUILEX INFLOW (TUBING) ×2 IMPLANT
TUBING AQUILEX OUTFLOW (TUBING) ×2 IMPLANT

## 2017-02-21 NOTE — Anesthesia Postprocedure Evaluation (Signed)
Anesthesia Post Note  Patient: Dorothy Lynch  Procedure(s) Performed: Procedure(s) (LRB): DILATATION & CURETTAGE/HYSTEROSCOPY WITH MYOSURE (N/A)     Patient location during evaluation: PACU Anesthesia Type: General Level of consciousness: awake and alert Pain management: pain level controlled Vital Signs Assessment: post-procedure vital signs reviewed and stable Respiratory status: spontaneous breathing, nonlabored ventilation and respiratory function stable Cardiovascular status: blood pressure returned to baseline and stable Postop Assessment: no apparent nausea or vomiting Anesthetic complications: no    Last Vitals:  Vitals:   02/21/17 0915 02/21/17 0930  BP: (!) 109/59 (!) 102/54  Pulse: (!) 52 (!) 53  Resp: 12 12  Temp:    SpO2: 100% 99%    Last Pain:  Vitals:   02/21/17 3614  TempSrc: Oral                 Lynda Rainwater

## 2017-02-21 NOTE — Discharge Instructions (Signed)

## 2017-02-21 NOTE — Op Note (Signed)
Dorothy Lynch 11/28/1960 [id number]   Post Operative Note   Date of surgery:  02/21/2017  Pre Op Dx:  Postmenopausal bleeding, endometrial polyp, hormone replacement therapy  Post Op Dx:  Post menopausal bleeding, endometrial polyp, hormone placement therapy  Procedure:  Hysteroscopy D&C, Myosure resection of endometrial polyps  Surgeon:  Anastasio Auerbach  Anesthesia:  General  EBL:  10 cc  Distended media discrepancy:  923 cc saline  Complications:  None  Specimen:  #1 endometrial polyps #2 endometrial curetting to pathology  Findings: EUA:  External BUS vagina with atrophic changes. Cervix normal. Uterus anteverted normal size midline mobile, adnexa without masses   Hysteroscopy:  Adequate noting right/left tubal ostia, fundus, anterior/posterior endometrial surfaces, lower uterine segment and endocervical canal all visualized.  Long fingerlike polyp originating from the lower posterior endometrial surface to the mid cervical canal resected to the level the surrounding endometrium. 2 smaller polyps right lateral endometrial surface resected to the level of the surrounding endometrium.  Procedure: The patient was taken to the operating room, was placed in the low dorsal lithotomy position, underwent general anesthesia, received a perineal/vaginal preparation with Betadine solution per nursing personnel and the bladder was emptied with in and out Foley catheterization. The timeout was performed by the surgical team. An EUA was performed. The patient was draped in the usual fashion. The cervix was visualized with a speculum, anterior lip grasped with a single-tooth tenaculum and a paracervical block was placed using 10 cc's of 1% lidocaine. The cervix was gently dilated to admit the Myosure hysteroscope and hysteroscopy was performed with findings noted above. Using the Myosure Reach resectoscopic wand the polyps were resected in their entirety to the level the surrounding endometrium. A  gentle sharp curettage was performed. Both specimens were sent separately to pathology.  Repeat hysteroscopy showed an empty cavity with good distention and no evidence of perforation. The specimens were identified to nursing personnel for pathology. Surgical counts were verified correct. The instruments were removed and adequate hemostasis was visualized and external cervical os. Silver nitrate was applied to the tenaculum site to achieve ultimate hemostasis. The patient was given intraoperative Toradol, was awakened without difficulty and was taken to the recovery room in good condition having tolerated the procedure well.     Anastasio Auerbach MD, 8:23 AM 02/21/2017

## 2017-02-21 NOTE — H&P (Signed)
The patient was examined.  I reviewed the proposed surgery and consent form with the patient.  The dictated history and physical is current and accurate and all questions were answered. The patient is ready to proceed with surgery and has a realistic understanding and expectation for the outcome.   Anastasio Auerbach MD, 7:36 AM 02/21/2017

## 2017-02-21 NOTE — Anesthesia Procedure Notes (Signed)
Procedure Name: LMA Insertion Date/Time: 02/21/2017 7:47 AM Performed by: Wanita Chamberlain Pre-anesthesia Checklist: Patient identified, Emergency Drugs available, Suction available, Patient being monitored and Timeout performed Patient Re-evaluated:Patient Re-evaluated prior to induction Oxygen Delivery Method: Circle system utilized Preoxygenation: Pre-oxygenation with 100% oxygen Induction Type: IV induction Ventilation: Mask ventilation without difficulty LMA: LMA inserted LMA Size: 4.0 Number of attempts: 1 Placement Confirmation: positive ETCO2 and breath sounds checked- equal and bilateral Tube secured with: Tape Dental Injury: Teeth and Oropharynx as per pre-operative assessment

## 2017-02-21 NOTE — Transfer of Care (Signed)
Immediate Anesthesia Transfer of Care Note  Patient: Dorothy Lynch  Procedure(s) Performed: Procedure(s): DILATATION & CURETTAGE/HYSTEROSCOPY WITH MYOSURE (N/A)  Patient Location: PACU  Anesthesia Type:General  Level of Consciousness: awake, alert , oriented and patient cooperative  Airway & Oxygen Therapy: Patient Spontanous Breathing and Patient connected to nasal cannula oxygen  Post-op Assessment: Report given to RN and Post -op Vital signs reviewed and stable  Post vital signs: Reviewed and stable  Last Vitals:  Vitals:   02/21/17 0915 02/21/17 0930  BP: (!) 109/59 (!) 102/54  Pulse: (!) 52 (!) 53  Resp: 12 12  Temp:    SpO2: 100% 99%    Last Pain:  Vitals:   02/21/17 0613  TempSrc: Oral      Patients Stated Pain Goal: 5 (98/11/91 4782)  Complications: No apparent anesthesia complications

## 2017-02-21 NOTE — Anesthesia Preprocedure Evaluation (Addendum)
Anesthesia Evaluation  Patient identified by MRN, date of birth, ID band Patient awake    Reviewed: Allergy & Precautions, H&P , NPO status , Patient's Chart, lab work & pertinent test results  History of Anesthesia Complications (+) PONV and history of anesthetic complications  Airway Mallampati: II  TM Distance: >3 FB Neck ROM: full    Dental no notable dental hx. (+) Teeth Intact, Dental Advisory Given, Caps,    Pulmonary neg pulmonary ROS,    Pulmonary exam normal breath sounds clear to auscultation       Cardiovascular negative cardio ROS Normal cardiovascular exam Rhythm:regular Rate:Normal     Neuro/Psych  Headaches, PSYCHIATRIC DISORDERS Anxiety Depression Bipolar Disorder insomnia, anxiety on venlafaxine, ambien, lamictal, lexapro   GI/Hepatic negative GI ROS, Neg liver ROS,   Endo/Other  Hypothyroidism   Renal/GU negative Renal ROS     Musculoskeletal  (+) Arthritis ,   Abdominal   Peds  Hematology   Anesthesia Other Findings Postmenopausal bleeding  Reproductive/Obstetrics negative OB ROS                            Anesthesia Physical  Anesthesia Plan  ASA: III  Anesthesia Plan: General   Post-op Pain Management:    Induction: Intravenous  PONV Risk Score and Plan: 4 or greater and Ondansetron, Dexamethasone, Midazolam, Scopolamine patch - Pre-op and Treatment may vary due to age or medical condition  Airway Management Planned: LMA  Additional Equipment:   Intra-op Plan:   Post-operative Plan: Extubation in OR  Informed Consent: I have reviewed the patients History and Physical, chart, labs and discussed the procedure including the risks, benefits and alternatives for the proposed anesthesia with the patient or authorized representative who has indicated his/her understanding and acceptance.     Plan Discussed with: Anesthesiologist, CRNA and  Surgeon  Anesthesia Plan Comments:         Anesthesia Quick Evaluation

## 2017-02-22 ENCOUNTER — Encounter (HOSPITAL_BASED_OUTPATIENT_CLINIC_OR_DEPARTMENT_OTHER): Payer: Self-pay | Admitting: Gynecology

## 2017-03-07 ENCOUNTER — Other Ambulatory Visit: Payer: Self-pay | Admitting: Gynecology

## 2017-03-09 ENCOUNTER — Encounter: Payer: Self-pay | Admitting: Gynecology

## 2017-03-09 ENCOUNTER — Ambulatory Visit (INDEPENDENT_AMBULATORY_CARE_PROVIDER_SITE_OTHER): Payer: PRIVATE HEALTH INSURANCE | Admitting: Gynecology

## 2017-03-09 VITALS — BP 118/74

## 2017-03-09 DIAGNOSIS — B379 Candidiasis, unspecified: Secondary | ICD-10-CM

## 2017-03-09 DIAGNOSIS — Z09 Encounter for follow-up examination after completed treatment for conditions other than malignant neoplasm: Secondary | ICD-10-CM

## 2017-03-09 MED ORDER — TERCONAZOLE 0.4 % VA CREA: 1.0000 | TOPICAL_CREAM | Freq: Every day | VAGINAL | 0 refills | Status: DC

## 2017-03-09 NOTE — Patient Instructions (Signed)
Use the Terazol vaginal cream nightly for 7 nights. Use an applicator once a week following this to see if we cannot keep away the yeast infections.  Follow up next summer for your annual exam when due. Sooner if any issues.

## 2017-03-09 NOTE — Progress Notes (Signed)
    Dorothy Lynch [birth year] [id number]        56 y.o.  [id number] presents for her postoperative visit status post hysteroscopic resection of endometrial polyps. Has done well with no bleeding since. Does note a vaginal discharge with irritation following her course of antibiotics by another physician.  Past medical history,surgical history, problem list, medications, allergies, family history and social history were all reviewed and documented in the EPIC chart.  Directed ROS with pertinent positives and negatives documented in the history of present illness/assessment and plan.  Exam: Caryn Bee assistant Vitals:   03/09/17 1443  BP: 118/74   General appearance:  Normal Abdomen soft nontender without masses guarding rebound Pelvic external BUS vagina with scant discharge. Cervix normal. Uterus normal size midline mobile nontender. Adnexa without masses or tenderness.  Assessment/Plan:  56 y.o. K3E7614 with normal postoperative visit status post hysteroscopic resection of endometrial polyps. I reviewed the benign pathology report with her pictures from the surgery. She is having a little bit itching irritation and discharge. Had a course of recent antibiotics. Will cover for yeast with Terazol 7 day cream. 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 She'll do this through the additional tube and then see how she does. She will follow up next summer when due for her annual exam, sooner if any issues.    Anastasio Auerbach MD, 3:11 PM 03/09/2017

## 2017-03-18 ENCOUNTER — Other Ambulatory Visit: Payer: Self-pay | Admitting: Gynecology

## 2017-03-22 ENCOUNTER — Other Ambulatory Visit: Payer: Self-pay | Admitting: *Deleted

## 2017-03-22 ENCOUNTER — Encounter: Payer: Self-pay | Admitting: Gynecology

## 2017-03-22 ENCOUNTER — Ambulatory Visit (INDEPENDENT_AMBULATORY_CARE_PROVIDER_SITE_OTHER): Payer: PRIVATE HEALTH INSURANCE | Admitting: Gynecology

## 2017-03-22 VITALS — BP 120/76

## 2017-03-22 DIAGNOSIS — N760 Acute vaginitis: Secondary | ICD-10-CM | POA: Diagnosis not present

## 2017-03-22 LAB — WET PREP FOR TRICH, YEAST, CLUE

## 2017-03-22 MED ORDER — NONFORMULARY OR COMPOUNDED ITEM: 2 refills | Status: DC

## 2017-03-22 NOTE — Progress Notes (Signed)
    Dorothy Lynch July 31, 1960 [id number]        56 y.o.  [id number] presents having been seen 2 weeks ago for her postoperative visit.  Was having some vaginal irritation and started on Terazol.  Several days after starting the Terazol she had an intense irritation and erythema in the vaginal area.  Also started developing dysuria and frequency.  Was treated over the phone by the on-call physician with [facility] and she is at the end of her one-week course and notes that her frequency dysuria feels better but still was having some irritation but no degree as she was having previously.  No discharge now.  No odor.  No nausea vomiting diarrhea constipation.  Past medical history,surgical history, problem list, medications, allergies, family history and social history were all reviewed and documented in the EPIC chart.  Directed ROS with pertinent positives and negatives documented in the history of present illness/assessment and plan.  Exam: Caryn Bee assistant Vitals:   03/22/17 1518  BP: 120/76   General appearance:  Normal Abdomen soft nontender without masses guarding rebound Pelvic external BUS vagina with mild erythema.  No discharge.  Cervix normal.  Uterus normal size midline mobile nontender.  Adnexa without masses or tenderness.  Assessment/Plan:  56 y.o. [id number] with history as above.  Wet prep is negative.  I reviewed with the patient that she probably had a chemical vaginitis and response to the cream.  At this point as she is feeling better and does not appear to have an infectious vaginitis at this time with a negative wet prep and no significant discharge I recommended she use a medicated douche x1 such as [place] to see if this does not help soothe her symptoms.  If she continues to improve and the symptoms resolved and will follow.  She knows to call me if her symptoms persist or return.  She will complete her course of Macrobid and follow-up if she has any residual urinary symptoms.   She is also going to plan to use boric acid suppositories twice weekly as a vaginitis suppressive strategy that she has used in the past and she recently was refilled on her boric acid.  I did ask her to wait though 2 weeks to allow this episode of vaginitis to completely clear.    Anastasio Auerbach MD, 3:45 PM 03/22/2017

## 2017-03-22 NOTE — Telephone Encounter (Signed)
Rx called in 

## 2017-03-22 NOTE — Addendum Note (Signed)
Addended by: Nelva Nay on: 03/22/2017 04:30 PM   Modules accepted: Orders

## 2017-03-22 NOTE — Patient Instructions (Signed)
Use the medicated vaginal douche x1 to see if that does not help to soothe your symptoms.  Follow-up if your symptoms persist or recur.

## 2017-04-11 ENCOUNTER — Other Ambulatory Visit: Payer: Self-pay | Admitting: Family Medicine

## 2017-06-22 ENCOUNTER — Other Ambulatory Visit: Payer: Self-pay | Admitting: Gynecology

## 2017-06-22 LAB — HM MAMMOGRAPHY

## 2017-09-29 ENCOUNTER — Other Ambulatory Visit: Payer: Self-pay | Admitting: Gynecology

## 2017-09-30 NOTE — Telephone Encounter (Signed)
Received refill request for her Synthroid.  I thought Dr. Yong Channel was following her for this?

## 2017-10-04 ENCOUNTER — Other Ambulatory Visit: Payer: Self-pay | Admitting: Gynecology

## 2017-10-10 ENCOUNTER — Other Ambulatory Visit: Payer: Self-pay | Admitting: Gynecology

## 2017-10-28 ENCOUNTER — Ambulatory Visit (INDEPENDENT_AMBULATORY_CARE_PROVIDER_SITE_OTHER): Payer: PRIVATE HEALTH INSURANCE | Admitting: Family Medicine

## 2017-10-28 ENCOUNTER — Encounter: Payer: Self-pay | Admitting: Family Medicine

## 2017-10-28 VITALS — BP 124/72 | HR 65 | Temp 98.3°F | Ht 69.0 in | Wt 154.6 lb

## 2017-10-28 DIAGNOSIS — K5900 Constipation, unspecified: Secondary | ICD-10-CM | POA: Diagnosis not present

## 2017-10-28 DIAGNOSIS — R140 Abdominal distension (gaseous): Secondary | ICD-10-CM | POA: Diagnosis not present

## 2017-10-28 DIAGNOSIS — E039 Hypothyroidism, unspecified: Secondary | ICD-10-CM

## 2017-10-28 DIAGNOSIS — F3181 Bipolar II disorder: Secondary | ICD-10-CM | POA: Diagnosis not present

## 2017-10-28 DIAGNOSIS — R358 Other polyuria: Secondary | ICD-10-CM

## 2017-10-28 DIAGNOSIS — R3589 Other polyuria: Secondary | ICD-10-CM

## 2017-10-28 LAB — COMPREHENSIVE METABOLIC PANEL
ALT: 11 U/L (ref 0–35)
AST: 13 U/L (ref 0–37)
Albumin: 3.9 g/dL (ref 3.5–5.2)
Alkaline Phosphatase: 68 U/L (ref 39–117)
BILIRUBIN TOTAL: 0.3 mg/dL (ref 0.2–1.2)
BUN: 15 mg/dL (ref 6–23)
CO2: 28 meq/L (ref 19–32)
Calcium: 9.3 mg/dL (ref 8.4–10.5)
Chloride: 101 mEq/L (ref 96–112)
Creatinine, Ser: 0.94 mg/dL (ref 0.40–1.20)
GFR: 64.92 mL/min (ref >60.00)
GLUCOSE: 91 mg/dL (ref 70–99)
POTASSIUM: 3.9 meq/L (ref 3.5–5.1)
SODIUM: 136 meq/L (ref 135–145)
Total Protein: 7.1 g/dL (ref 6.0–8.3)

## 2017-10-28 LAB — POC URINALSYSI DIPSTICK (AUTOMATED)
BILIRUBIN UA: NEGATIVE
Blood, UA: NEGATIVE
GLUCOSE UA: NEGATIVE
Ketones, UA: NEGATIVE
LEUKOCYTES UA: NEGATIVE
NITRITE UA: NEGATIVE
Protein, UA: NEGATIVE
Spec Grav, UA: 1.01 (ref 1.010–1.025)
Urobilinogen, UA: 0.2 E.U./dL
pH, UA: 7 (ref 5.0–8.0)

## 2017-10-28 LAB — CBC
HCT: 38.8 % (ref 36.0–46.0)
HEMOGLOBIN: 13.1 g/dL (ref 12.0–15.0)
MCHC: 33.8 g/dL (ref 30.0–36.0)
MCV: 96 fl (ref 78.0–100.0)
Platelets: 291 10*3/uL (ref 150.0–400.0)
RBC: 4.05 Mil/uL (ref 3.87–5.11)
RDW: 13 % (ref 11.5–15.5)
WBC: 7.3 10*3/uL (ref 4.0–10.5)

## 2017-10-28 LAB — TSH: TSH: 3.68 u[IU]/mL (ref 0.35–4.50)

## 2017-10-28 NOTE — Patient Instructions (Addendum)
Please stop by lab before you go  Trial half a capful of miralax or generic miralax once a day. Hold for loose or watery stools for 2 days - if resolves can restart. My hope is to get you to perhaps 5 bowel movements a week- I think you would feel better with less bloating. Can go to full capful if after 2 weeks not back to every other day at least.   Can also trial gas x  Can see if we have anything on schedule for a physical within next month or so- if we dont go ahead and still see me for follow up so we can make sure you are improving or make tweaks in the regimen  If you have fever, chills, unintentional weight loss, worsening symptoms before then- happy to see you sooner.

## 2017-10-28 NOTE — Progress Notes (Addendum)
Subjective:  Dorothy Lynch is a 57 y.o. yeAr old very pleasant female patient who presents for/with See problem oriented charting ROS-   no fever or chills.  No chest pain or shortness of breath.  Admits to some anxiety and stress.  Past Medical History-  Patient Active Problem List   Diagnosis Date Noted  . Bipolar II disorder ([place]) 01/18/2011    Priority: High  . Menopausal symptoms 10/14/2015    Priority: Medium  . Hypothyroidism 10/14/2015    Priority: Medium  . Insomnia 01/18/2011    Priority: Medium  . Allergic rhinitis 10/14/2015    Priority: Low  . Yeast infection 10/14/2015    Priority: Low  . Primary osteoarthritis of left hip 01/24/2015    Priority: Low  . History of squamous cell carcinoma of skin     Priority: Low    Medications- reviewed and updated Current Outpatient Medications  Medication Sig Dispense Refill  . BIOTIN PO Take 1 tablet by mouth daily.     . Cholecalciferol (VITAMIN D3) 2000 units TABS Take 1 tablet by mouth daily.    Marland Kitchen escitalopram (LEXAPRO) 10 MG tablet TAKE 1 TABLET BY MOUTH daily 90 tablet 0  . estradiol (ESTRACE) 1 MG tablet TAKE 3 TABLETS BY MOUTH EVERY EVENING 90 tablet 12  . fexofenadine (ALLEGRA) 180 MG tablet TAKE 1 TABLET BY MOUTH EVERY DAY 30 tablet 6  . Glucosamine HCl (GLUCOSAMINE PO) Take 1 tablet by mouth daily.    Marland Kitchen lamoTRIgine (LAMICTAL) 100 MG tablet TAKE 1 TABLET BY MOUTH 2 TIMES DAILY 180 tablet 2  . levothyroxine (SYNTHROID, LEVOTHROID) 75 MCG tablet TAKE 1 TABLET BY MOUTH EVERY DAY BEFORE BREAKFAST 90 tablet 1  . NONFORMULARY OR COMPOUNDED ITEM Boric acid suppository 600 mg once vaginally twice weekly 30 each 2  . Omega-3 Fatty Acids (FISH OIL PO) Take 1 capsule by mouth daily.     Vladimir Faster Glycol-Propyl Glycol (SYSTANE OP) Place 1 drop into both eyes daily as needed (for dry eyes).    . Probiotic Product (PROBIOTIC PO) Take 2 tablets by mouth daily.     . progesterone (PROMETRIUM) 100 MG capsule Take 1 capsule (100 mg  total) by mouth at bedtime. 30 capsule 11  . tretinoin (RETIN-A) 0.05 % cream Apply topically at bedtime. 45 g 1  . venlafaxine XR (EFFEXOR-XR) 37.5 MG 24 hr capsule TAKE 1 CAPSULE BY MOUTH EVERY DAY 90 capsule 0  . zolpidem (AMBIEN) 10 MG tablet TAKE 1 TABLET BY MOUTH AT BEDTIME AS NEEDED FOR SLEEP 30 tablet 4   No current facility-administered medications for this visit.     Objective: BP 124/72 (BP Location: Left Arm, Patient Position: Sitting, Cuff Size: Large)   Pulse 65   Temp 98.3 F (36.8 C) (Oral)   Ht 5\' 9"  (1.753 m)   Wt 154 lb 9.6 oz (70.1 kg)   LMP 09/28/2008   SpO2 96%   BMI 22.83 kg/m  Gen: NAD, resting comfortably CV: RRR no murmurs rubs or gallops Lungs: CTAB no crackles, wheeze, rhonchi Abdomen: soft/mild tenderness LLQ/nondistended/normal bowel sounds. No rebound or guarding.  Ext: no edema Skin: warm, dry Neuro: fast speech (not pressured) and stable from baseline  Assessment/Plan:  Bloating/constipation  S:  has had some significant bloating for 3 months- feels like abdomen pouches out more than normal. 2 days a week feels like she is just dragging. She denies depressed mood. She has noted constipation- having 2 BMs a week- perhaps 3 (used to be  at least every other day with no laxatives)- but has to take dulcolax to have this many. Exercising still. Staying well hydrated. Normal caliber stool (had colonoscopy 02/19/2010 and reports normal) perhaps slightly thinner but not pencil thin. No blood in stool or melena. Usually worse with meals and progress as days go on. Better with burping or belching. Has not tried gas x. Tums helps some. No burning in chest.  No melena or bright red blood per rectum reported.  Did switch water- in new apartment complex  Of note the last few months have been extremely stressful.  She and her husband separated and she moved out in April.  She also lost her job.  She continues to exercise and stay well-hydrated per her report A/P: I  am concerned about constipation as a cause-still want close follow-up.  Encouraged gynecology follow-up as well-though doubt something like ovarian cancer.  We could consider abdominal imaging but will start with conservative care  From AVS:  " Trial half a capful of miralax or generic miralax once a day. Hold for loose or watery stools for 2 days - if resolves can restart. My hope is to get you to perhaps 5 bowel movements a week- I think you would feel better with less bloating. Can go to full capful if after 2 weeks not back to every other day at least.   Can also trial gas x  Can see if we have anything on schedule for a physical within next month or so- if we dont go ahead and still see me for follow up so we can make sure you are improving or make tweaks in the regimen  If you have fever, chills, unintentional weight loss, worsening symptoms before then- happy to see you sooner.  "  Polyuria  s: some urinary frequency. No pain. Small amount voided when does pee.  POCT Urinalysis Dipstick (Automated)     Status: None   Collection Time: 10/28/17  3:27 PM  Result Value Ref Range   Color, UA Clear    Clarity, UA Yellow    Glucose, UA Negative Negative   Bilirubin, UA Negative    Ketones, UA Negative    Spec Grav, UA 1.010 1.010 - 1.025   Blood, UA Negative    pH, UA 7.0 5.0 - 8.0   Protein, UA Negative Negative   Urobilinogen, UA 0.2 0.2 or 1.0 E.U./dL   Nitrite, UA Negative    Leukocytes, UA Negative Negative  A/P:  UA is unremarkable.  UTI unlikely.  If she were to have recurrence worsening of symptoms we will get another urinalysis and urine culture at that time.  Hypothyroidism S: On thyroid medication-Levothyroxine 75 mcg Lab Results  Component Value Date   TSH 3.68 10/28/2017  A/P: Patient symptoms are not due to hypothyroidism-TSH at goal  Bipolar II disorder [facility]) S: Patient admits to some down mood-she states this is situational and related to recent separation and  job loss.  She is working with a Company secretary within [place] counseling.  She remains on regimen started by Dr. Leanne Chang that I have continued- Lexapro 10mg . Effexor 37.5mg  extended release. Lamictal 100mg  twice a day. Depression screen PHQ 2/9 10/28/2017  Decreased Interest 1  Down, Depressed, Hopeless 1  PHQ - 2 Score 2  Altered sleeping 2  Tired, decreased energy 1  Change in appetite 0  Feeling bad or failure about yourself  1  Trouble concentrating 2  Moving slowly or fidgety/restless 1  Suicidal thoughts  0  PHQ-9 Score 9  Difficult doing work/chores Somewhat difficult  A/P: PHQ 9 slightly elevated.  Patient denies mania or hypomania.  Instead of adjusting medication she would like to continue with counseling   Future Appointments  Date Time Provider [place]  11/11/2017  3:30 PM Marin Olp, MD [facility]  12/19/2017  2:15 PM Marin Olp, MD [facility]   Lab/Order associations:  Hypothyroidism, unspecified type - Plan: TSH, CBC, Comprehensive metabolic panel  Polyuria - Plan: POCT Urinalysis Dipstick (Automated)  Return precautions advised.  Garret Reddish, MD

## 2017-10-29 NOTE — Assessment & Plan Note (Signed)
S: On thyroid medication-Levothyroxine 75 mcg Lab Results  Component Value Date   TSH 3.68 10/28/2017  A/P: Patient symptoms are not due to hypothyroidism-TSH at goal

## 2017-10-29 NOTE — Assessment & Plan Note (Signed)
S: Patient admits to some down mood-she states this is situational and related to recent separation and job loss.  She is working with a Company secretary within [place] counseling.  She remains on regimen started by Dr. Leanne Chang that I have continued- Lexapro 10mg . Effexor 37.5mg  extended release. Lamictal 100mg  twice a day. Depression screen PHQ 2/9 10/28/2017  Decreased Interest 1  Down, Depressed, Hopeless 1  PHQ - 2 Score 2  Altered sleeping 2  Tired, decreased energy 1  Change in appetite 0  Feeling bad or failure about yourself  1  Trouble concentrating 2  Moving slowly or fidgety/restless 1  Suicidal thoughts 0  PHQ-9 Score 9  Difficult doing work/chores Somewhat difficult  A/P: PHQ 9 slightly elevated.  Patient denies mania or hypomania.  Instead of adjusting medication she would like to continue with counseling

## 2017-10-29 NOTE — Addendum Note (Signed)
Addended by: Marin Olp on: 10/29/2017 08:01 PM   Modules accepted: Level of Service

## 2017-11-07 ENCOUNTER — Other Ambulatory Visit: Payer: Self-pay | Admitting: Gynecology

## 2017-11-07 NOTE — Telephone Encounter (Signed)
I received a refill for her thyroid.  She sees Dr. Yong Channel and I am trying to transition her non-gynecologic medications over to her primary physician.  I would refer this to his office for management/refills.

## 2017-11-11 ENCOUNTER — Ambulatory Visit: Payer: PRIVATE HEALTH INSURANCE | Admitting: Family Medicine

## 2017-11-30 ENCOUNTER — Other Ambulatory Visit: Payer: Self-pay | Admitting: Gynecology

## 2017-12-03 ENCOUNTER — Other Ambulatory Visit: Payer: Self-pay | Admitting: Family Medicine

## 2017-12-05 NOTE — Telephone Encounter (Signed)
Patient calling to check on this medication refill. States that she called it in on Wednesday. Informed her that on Wednesday it was sent to Dr Phineas Real who denied it and sent the request to Dr Yong Channel. Please advise. Patient is currently out of medication. Would like a call back to be informed what decision is made.

## 2017-12-05 NOTE — Telephone Encounter (Signed)
See note

## 2017-12-12 ENCOUNTER — Other Ambulatory Visit: Payer: Self-pay | Admitting: Gynecology

## 2017-12-19 ENCOUNTER — Encounter: Payer: Self-pay | Admitting: Family Medicine

## 2017-12-19 ENCOUNTER — Ambulatory Visit (INDEPENDENT_AMBULATORY_CARE_PROVIDER_SITE_OTHER): Payer: PRIVATE HEALTH INSURANCE | Admitting: Family Medicine

## 2017-12-19 VITALS — BP 110/72 | HR 57 | Temp 98.4°F | Ht 68.5 in | Wt 158.0 lb

## 2017-12-19 DIAGNOSIS — F3181 Bipolar II disorder: Secondary | ICD-10-CM | POA: Diagnosis not present

## 2017-12-19 DIAGNOSIS — E039 Hypothyroidism, unspecified: Secondary | ICD-10-CM

## 2017-12-19 DIAGNOSIS — Z0000 Encounter for general adult medical examination without abnormal findings: Secondary | ICD-10-CM

## 2017-12-19 DIAGNOSIS — K5900 Constipation, unspecified: Secondary | ICD-10-CM

## 2017-12-19 MED ORDER — ESCITALOPRAM OXALATE 10 MG PO TABS: 10.0000 mg | ORAL_TABLET | Freq: Every day | ORAL | 2 refills | Status: DC

## 2017-12-19 MED ORDER — VENLAFAXINE HCL ER 37.5 MG PO CP24: 37.5000 mg | ORAL_CAPSULE | Freq: Every day | ORAL | 2 refills | Status: DC

## 2017-12-19 NOTE — Assessment & Plan Note (Signed)
Doing well on levothyroxine 75 mcg Lab Results  Component Value Date   TSH 3.68 10/28/2017

## 2017-12-19 NOTE — Progress Notes (Signed)
Phone: [phone]  Subjective:  Patient presents today for their annual physical. Chief complaint-noted.   See problem oriented charting- ROS- full  review of systems was completed and negative except for: sweating, dry eye, rare abdominal pain, some constipation, seasonal allergies, sleep disturbance  The following were reviewed and entered/updated in epic: Past Medical History:  Diagnosis Date  . Anxiety   . Arthritis   . ASCUS of cervix with negative high risk HPV 12/2016  . Bipolar 2 disorder ([place])   . Chronic venous insufficiency    superficial---  s/p  failed attempt sclerotherapy  . DDD (degenerative disc disease)   . Depression   . Endometrial polyp   . History of basal cell carcinoma (BCC) excision    08-30-2014  left mid medial chest  . History of squamous cell carcinoma excision    2016 approx. x3 shoulder area/   left lower leg , shin 02-02-2017 approx.  . Hypothyroidism   . Macular degeneration   . PMB (postmenopausal bleeding)   . PONV (postoperative nausea and vomiting)   . Scoliosis   . Varicose vein of leg    Patient Active Problem List   Diagnosis Date Noted  . Bipolar II disorder ([place]) 01/18/2011    Priority: High  . Menopausal symptoms 10/14/2015    Priority: Medium  . Hypothyroidism 10/14/2015    Priority: Medium  . Insomnia 01/18/2011    Priority: Medium  . Constipation 12/19/2017    Priority: Low  . Allergic rhinitis 10/14/2015    Priority: Low  . Yeast infection 10/14/2015    Priority: Low  . Primary osteoarthritis of left hip 01/24/2015    Priority: Low  . History of squamous cell carcinoma of skin     Priority: Low   Past Surgical History:  Procedure Laterality Date  . ACHILLES TENDON SURGERY  2001  . BREAST SURGERY  march 2011   breast bx-B-9, fibrocyctic changes  . COLONOSCOPY  02-19-2010  dr Sydell Axon brodie  . DILATATION & CURETTAGE/HYSTEROSCOPY WITH MYOSURE N/A 02/21/2017   Procedure: DILATATION & CURETTAGE/HYSTEROSCOPY WITH  MYOSURE;  Surgeon: Anastasio Auerbach, MD;  Location: [facility];  Service: Gynecology;  Laterality: N/A;  . FACIAL COSMETIC SURGERY  2012  . INGUINAL HERNIA REPAIR Left 1995   2 hernia's on left side  . TOTAL HIP ARTHROPLASTY Left 01/24/2015   Procedure: TOTAL HIP ARTHROPLASTY ANTERIOR APPROACH;  Surgeon: Dorna Leitz, MD;  Location: [facility];  Service: Orthopedics;  Laterality: Left;    Family History  Problem Relation Age of Onset  . Diabetes Father   . Hypertension Father   . Stroke Father 69       died at 47  . Thyroid disease Mother   . Breast cancer Mother 16       84 in 2017, survived breast cancer  . Cancer Maternal Grandmother 52       lung, smoker  . Stroke Maternal Grandfather   . Heart disease Paternal Grandmother        mi  . CVA Brother        61s smoking  . Alcohol abuse Brother        recovering    Medications- reviewed and updated Current Outpatient Medications  Medication Sig Dispense Refill  . BIOTIN PO Take 1 tablet by mouth daily.     . Cholecalciferol (VITAMIN D3) 2000 units TABS Take 1 tablet by mouth daily.    Marland Kitchen escitalopram (LEXAPRO) 10 MG tablet Take 1 tablet (10  mg total) by mouth daily. 90 tablet 2  . estradiol (ESTRACE) 1 MG tablet TAKE 3 TABLETS BY MOUTH EVERY EVENING 90 tablet 12  . fexofenadine (ALLEGRA) 180 MG tablet TAKE 1 TABLET BY MOUTH EVERY DAY (Patient taking differently: TAKE 1 TABLET BY MOUTH EVERY DAY PRN) 30 tablet 6  . Glucosamine HCl (GLUCOSAMINE PO) Take 1 tablet by mouth daily.    Marland Kitchen lamoTRIgine (LAMICTAL) 100 MG tablet TAKE 1 TABLET BY MOUTH 2 TIMES DAILY 180 tablet 2  . levothyroxine (SYNTHROID, LEVOTHROID) 75 MCG tablet TAKE 1 TABLET BY MOUTH EVERY DAY BEFORE BREAKFAST 90 tablet 2  . NONFORMULARY OR COMPOUNDED ITEM Boric acid suppository 600 mg once vaginally twice weekly 30 each 2  . Omega-3 Fatty Acids (FISH OIL PO) Take 1 capsule by mouth daily.     Vladimir Faster Glycol-Propyl Glycol (SYSTANE OP) Place 1 drop into  both eyes daily as needed (for dry eyes).    . Probiotic Product (PROBIOTIC PO) Take 2 tablets by mouth daily.     . progesterone (PROMETRIUM) 100 MG capsule Take 1 capsule (100 mg total) by mouth at bedtime. 30 capsule 11  . tretinoin (RETIN-A) 0.05 % cream Apply topically at bedtime. 45 g 1  . venlafaxine XR (EFFEXOR-XR) 37.5 MG 24 hr capsule Take 1 capsule (37.5 mg total) by mouth daily. 90 capsule 2  . zolpidem (AMBIEN) 10 MG tablet TAKE 1 TABLET BY MOUTH AT BEDTIME AS NEEDED FOR SLEEP 30 tablet 2   No current facility-administered medications for this visit.     Allergies-reviewed and updated Allergies  Allergen Reactions  . Penicillins Other (See Comments)    Headaches   . Sulfa Antibiotics Other (See Comments)    migraines    Social History   Social History Narrative   Married (met husband Jenny Reichmann- will be establishing). 2 children. Daughter Sonia Baller is my patient- works in [place]. Son in [place].        Works with [employer]: former running, walks, gardens, entertains a lot, cook    Objective: BP 110/72 (BP Location: Left Arm, Patient Position: Sitting, Cuff Size: Normal)   Pulse (!) 57   Temp 98.4 F (36.9 C) (Oral)   Ht 5' 8.5" (1.74 m)   Wt 158 lb (71.7 kg)   LMP 09/28/2008   SpO2 97%   BMI 23.67 kg/m  Gen: NAD, resting comfortably HEENT: Mucous membranes are moist. Oropharynx normal Neck: no thyromegaly CV: RRR no murmurs rubs or gallops Lungs: CTAB no crackles, wheeze, rhonchi Abdomen: soft/nontender/nondistended/normal bowel sounds. No rebound or guarding.  Ext: no edema Skin: warm, dry Neuro: grossly normal, moves all extremities, PERRLA  Assessment/Plan:  57 y.o. female presenting for annual physical.  Health Maintenance counseling: 1. Anticipatory guidance: Patient counseled regarding regular dental exams -q6 months, eye exams - yearly, wearing seatbelts.  2. Risk factor reduction:  Advised patient of need for regular exercise and  diet rich and fruits and vegetables to reduce risk of heart attack and stroke. Exercise- treading water and doing fast walking- not able to run after hip replacement. Diet-reasonable- she is worried about slight weight gain- discussed health habits.  Wt Readings from Last 3 Encounters:  12/19/17 158 lb (71.7 kg)  10/28/17 154 lb 9.6 oz (70.1 kg)  02/21/17 153 lb 8 oz (69.6 kg)  3. Immunizations/screenings/ancillary studies- discussed shingrix when availability Immunization History  Administered Date(s) Administered  . Tdap 10/14/2015  4. Cervical cancer screening- 12/2016 and advised  one year repeat due to mild atypica 5. Breast cancer screening-  breast exam with gyn per patient preference and mammogram -getting records from [facility] 6. Colon cancer screening - 02/19/10 with 10 year repeat  7. Skin cancer screening- Dr. Ubaldo Glassing. advised regular sunscreen use. Denies worrisome, changing, or new skin lesions.  8. Birth control/STD check- postmenopausal. She has been active with 1 partner- monogamous since separation. Advised use condoms - declined testing for now.  9. Osteoporosis screening at 27- will plan on this  Status of chronic or acute concerns   GFR >60 last check- continue to trend at least yearly  Bipolar II disorder [facility]) Patient is doing well on Lexapro 25m. Effexor 37.540mextended release. Lamictal 10068mwice a day. PHQ9 shows improvement from last visit (see flowsheets) now down to 5. I sent in lexapro and effexor and sent message to Dr. FonPhineas Realast rx) to make sure he is ok with me prescribing/keep him in the loop.   Hypothyroidism Doing well on levothyroxine 75 mcg Lab Results  Component Value Date   TSH 3.68 10/28/2017    Constipation BM back to normal with 4 day a week BM up from 2. Less bloating with that.   Future Appointments  Date Time Provider [place]/22/2019  2:30 PM Fontaine, TimBelinda BlockD [facility]   Return in about 1 year (around 12/20/2018)  for physical. Patient agrees if has worsening mood symptoms that she will see us [place] sooner.   Lab/Order associations: Preventative health care  B[place] II disorder ([place], unspecified type  Constipation, unspecified constipation type  Meds ordered this encounter  Medications  . venlafaxine XR (EFFEXOR-XR) 37.5 MG 24 hr capsule    Sig: Take 1 capsule (37.5 mg total) by mouth daily.    Dispense:  90 capsule    Refill:  2    This prescription was filled on 10/10/2017. Any refills authorized will be placed on file.  . escitalopram (LEXAPRO) 10 MG tablet    Sig: Take 1 tablet (10 mg total) by mouth daily.    Dispense:  90 tablet    Refill:  2    This prescription was filled on 10/10/2017. Any refills authorized will be placed on file.   Return precautions advised.  SteGarret ReddishD

## 2017-12-19 NOTE — Assessment & Plan Note (Addendum)
Patient is doing well on Lexapro 10mg . Effexor 37.5mg  extended release. Lamictal 100mg  twice a day. PHQ9 shows improvement from last visit (see flowsheets) now down to 5. I sent in lexapro and effexor and sent message to Dr. Phineas Real (last rx) to make sure he is ok with me prescribing/keep him in the loop.

## 2017-12-19 NOTE — Patient Instructions (Addendum)
Things look great today. Skipping cholesterol until 2021 since #s looked so good last time.   Please use protection with sex to be on safe side.   Im ok with 1 year follow up as long as you think your mood/depression/bipolar is stable- see [place] immediately if any worsening symptoms

## 2017-12-19 NOTE — Assessment & Plan Note (Addendum)
BM back to normal with 4 day a week BM up from 2. Less bloating with that.

## 2018-01-13 ENCOUNTER — Other Ambulatory Visit: Payer: Self-pay | Admitting: Family Medicine

## 2018-01-19 ENCOUNTER — Ambulatory Visit (INDEPENDENT_AMBULATORY_CARE_PROVIDER_SITE_OTHER): Payer: PRIVATE HEALTH INSURANCE | Admitting: Gynecology

## 2018-01-19 ENCOUNTER — Encounter: Payer: Self-pay | Admitting: Gynecology

## 2018-01-19 VITALS — BP 112/66 | Ht 68.5 in | Wt 156.0 lb

## 2018-01-19 DIAGNOSIS — N951 Menopausal and female climacteric states: Secondary | ICD-10-CM

## 2018-01-19 DIAGNOSIS — Z01419 Encounter for gynecological examination (general) (routine) without abnormal findings: Secondary | ICD-10-CM | POA: Diagnosis not present

## 2018-01-19 DIAGNOSIS — Z79890 Hormone replacement therapy (postmenopausal): Secondary | ICD-10-CM | POA: Diagnosis not present

## 2018-01-19 DIAGNOSIS — N952 Postmenopausal atrophic vaginitis: Secondary | ICD-10-CM | POA: Diagnosis not present

## 2018-01-19 MED ORDER — ZOLPIDEM TARTRATE 10 MG PO TABS: 10.0000 mg | ORAL_TABLET | Freq: Every evening | ORAL | 4 refills | Status: DC | PRN

## 2018-01-19 MED ORDER — PROGESTERONE MICRONIZED 100 MG PO CAPS: 100.0000 mg | ORAL_CAPSULE | Freq: Every day | ORAL | 11 refills | Status: DC

## 2018-01-19 MED ORDER — ESTRADIOL 1 MG PO TABS: 3.0000 mg | ORAL_TABLET | Freq: Every evening | ORAL | 12 refills | Status: DC

## 2018-01-19 NOTE — Patient Instructions (Signed)
Follow-up in 1 year, sooner as needed. 

## 2018-01-19 NOTE — Addendum Note (Signed)
Addended by: Nelva Nay on: 01/19/2018 04:47 PM   Modules accepted: Orders

## 2018-01-19 NOTE — Progress Notes (Signed)
    Dorothy Lynch 04/10/1961 [id number]        57 y.o.  [id number] for annual gynecologic exam.  Continues on HRT but notes night sweats.  Takes 2 mg estradiol in the morning and 1 mg at bedtime.  Takes 100 mg Prometrium at bedtime.  Past medical history,surgical history, problem list, medications, allergies, family history and social history were all reviewed and documented as reviewed in the EPIC chart.  ROS:  Performed with pertinent positives and negatives included in the history, assessment and plan.   Additional significant findings : None   Exam: Caryn Bee assistant Vitals:   01/19/18 1503  BP: 112/66  Weight: 156 lb (70.8 kg)  Height: 5' 8.5" (1.74 m)   Body mass index is 23.37 kg/m.  General appearance:  Normal affect, orientation and appearance. Skin: Grossly normal HEENT: Without gross lesions.  No cervical or supraclavicular adenopathy. Thyroid normal.  Lungs:  Clear without wheezing, rales or rhonchi Cardiac: RR, without RMG Abdominal:  Soft, nontender, without masses, guarding, rebound, organomegaly or hernia Breasts:  Examined lying and sitting without masses, retractions, discharge or axillary adenopathy. Pelvic:  Ext, BUS, Vagina: Normal with atrophic changes  Cervix: Normal with atrophic changes  Uterus: Anteverted, normal size, shape and contour, midline and mobile nontender   Adnexa: Without masses or tenderness    Anus and perineum: Normal   Rectovaginal: Normal sphincter tone without palpated masses or tenderness.    Assessment/Plan:  57 y.o. [id number] female for annual gynecologic exam.   1. Postmenopausal/HRT.  Had hysteroscopy D&C resection of endometrial polyps earlier this year.  Is done well with no bleeding.  Takes estradiol 2 mg in the morning and 1 mg at bedtime along with her Prometrium 100 mg nightly.  Having hot flashes at night.  Recommended her taking the 2 mg at bedtime and 1 mg in the morning and see if this does not help with her nighttime hot  flushes.  She will continue on the Prometrium nightly.  She will call if she has continued symptoms or any vaginal bleeding. 2. Insomnia.  Uses Ambien 1/2 mg nightly.  We again discussed sleep hygiene.  She is having no side effects or issues with the Ambien such as sleep activities.  #30 with 4 refills provided. 3. Pap smear 12/2016 showed ASCUS negative high risk HPV.  Pap smear done today. 4. Mammography 05/2017.  Continue with annual mammography next year when due.  Breast exam normal today. 5. Colonoscopy 2011.  Repeat at their recommended interval. 6. DEXA never.  Will plan at age 75.  Increased calcium vitamin D. 7. Health maintenance.  No routine lab work done as patient does this at Dr. Ansel Bong office.  He also has assumed control of her psychoactive medications.  Follow-up in 1 year, sooner as needed.   Anastasio Auerbach MD, 4:40 PM 01/19/2018

## 2018-01-23 LAB — PAP IG W/ RFLX HPV ASCU

## 2018-01-23 LAB — HUMAN PAPILLOMAVIRUS, HIGH RISK: HPV DNA High Risk: NOT DETECTED

## 2018-01-27 NOTE — Telephone Encounter (Signed)
Patient informed with results, transferred to appointment desk to schedule.

## 2018-02-15 ENCOUNTER — Ambulatory Visit (INDEPENDENT_AMBULATORY_CARE_PROVIDER_SITE_OTHER): Payer: PRIVATE HEALTH INSURANCE | Admitting: Gynecology

## 2018-02-15 ENCOUNTER — Encounter: Payer: Self-pay | Admitting: Gynecology

## 2018-02-15 VITALS — BP 118/72

## 2018-02-15 DIAGNOSIS — R87610 Atypical squamous cells of undetermined significance on cytologic smear of cervix (ASC-US): Secondary | ICD-10-CM

## 2018-02-15 NOTE — Patient Instructions (Signed)
Follow-up next year when you are due for your annual exam.

## 2018-02-15 NOTE — Progress Notes (Signed)
    Dorothy Lynch 1960/09/12 [id number]        57 y.o.  [id number] presents for colposcopy.  History of ASCUS negative high risk HPV 2 years in a row.  No other history of significant abnormal Pap smears previously.  Also complaining of continued night sweats.  Is on estradiol 2 mg at bedtime and 1 mg in the morning.  Prometrium 100 mg nightly.  Past medical history,surgical history, problem list, medications, allergies, family history and social history were all reviewed and documented in the EPIC chart.  Directed ROS with pertinent positives and negatives documented in the history of present illness/assessment and plan.  Exam: Caryn Bee assistant Vitals:   02/15/18 1451  BP: 118/72   General appearance:  Normal Abdomen soft nontender without masses guarding rebound Pelvic external BUS vagina with atrophic changes.  Cervix with atrophic changes.  Uterus normal size midline mobile nontender.  Adnexa without masses or tenderness.  Colposcopy performed after acetic acid cleanse is adequate with transformation zone clearly visible 360 degrees.  No abnormality seen.  No biopsies taken.  Assessment/Plan:  57 y.o. [id number] with:  1. ASCUS negative high risk HPV x2 Pap smears.  Colposcopy was adequate and normal.  No biopsies taken.  Recommended follow-up Pap smear in 1 year. 2. Continued night sweats.  On estradiol 2 mg at bedtime and 1 mg in the morning.  Recommended increasing to 2 mg twice daily.  Continue on Prometrium.  Increased risk of thrombosis associated with estrogen discussed.  Follow-up with results.    Anastasio Auerbach MD, 3:14 PM 02/15/2018

## 2018-03-18 ENCOUNTER — Other Ambulatory Visit: Payer: Self-pay | Admitting: Gynecology

## 2018-06-03 ENCOUNTER — Other Ambulatory Visit: Payer: Self-pay | Admitting: Gynecology

## 2018-06-13 ENCOUNTER — Other Ambulatory Visit: Payer: Self-pay

## 2018-06-14 MED ORDER — ZOLPIDEM TARTRATE 10 MG PO TABS: 10.0000 mg | ORAL_TABLET | Freq: Every evening | ORAL | 4 refills | Status: DC | PRN

## 2018-06-28 ENCOUNTER — Encounter: Payer: Self-pay | Admitting: Gynecology

## 2018-07-31 DIAGNOSIS — S76212A Strain of adductor muscle, fascia and tendon of left thigh, initial encounter: Secondary | ICD-10-CM | POA: Diagnosis not present

## 2018-07-31 DIAGNOSIS — R262 Difficulty in walking, not elsewhere classified: Secondary | ICD-10-CM | POA: Diagnosis not present

## 2018-07-31 DIAGNOSIS — M79652 Pain in left thigh: Secondary | ICD-10-CM | POA: Diagnosis not present

## 2018-08-07 DIAGNOSIS — M545 Low back pain: Secondary | ICD-10-CM | POA: Diagnosis not present

## 2018-08-11 ENCOUNTER — Other Ambulatory Visit: Payer: Self-pay | Admitting: Family Medicine

## 2018-09-10 ENCOUNTER — Other Ambulatory Visit: Payer: Self-pay | Admitting: Gynecology

## 2018-09-11 ENCOUNTER — Other Ambulatory Visit: Payer: Self-pay | Admitting: *Deleted

## 2018-09-11 NOTE — Telephone Encounter (Signed)
Tell patient that she can now actually buy these across the counter in pharmacies or even order them off of [place].  She is looking for vaginal boric acid suppositories 600 mg

## 2018-09-11 NOTE — Telephone Encounter (Signed)
Patient informed. 

## 2018-09-28 DIAGNOSIS — H16103 Unspecified superficial keratitis, bilateral: Secondary | ICD-10-CM | POA: Diagnosis not present

## 2018-09-28 DIAGNOSIS — H353132 Nonexudative age-related macular degeneration, bilateral, intermediate dry stage: Secondary | ICD-10-CM | POA: Diagnosis not present

## 2018-09-28 DIAGNOSIS — H04123 Dry eye syndrome of bilateral lacrimal glands: Secondary | ICD-10-CM | POA: Diagnosis not present

## 2018-10-20 DIAGNOSIS — H04122 Dry eye syndrome of left lacrimal gland: Secondary | ICD-10-CM | POA: Diagnosis not present

## 2018-11-14 ENCOUNTER — Other Ambulatory Visit: Payer: Self-pay | Admitting: Family Medicine

## 2018-11-20 ENCOUNTER — Other Ambulatory Visit: Payer: Self-pay | Admitting: Gynecology

## 2018-11-20 NOTE — Telephone Encounter (Signed)
Called into pharmacy

## 2018-12-07 ENCOUNTER — Encounter: Payer: Self-pay | Admitting: Family Medicine

## 2018-12-11 ENCOUNTER — Ambulatory Visit (INDEPENDENT_AMBULATORY_CARE_PROVIDER_SITE_OTHER): Payer: PRIVATE HEALTH INSURANCE | Admitting: Family Medicine

## 2018-12-11 ENCOUNTER — Other Ambulatory Visit: Payer: Self-pay

## 2018-12-11 ENCOUNTER — Encounter: Payer: Self-pay | Admitting: Family Medicine

## 2018-12-11 VITALS — BP 102/76 | HR 59 | Temp 98.8°F | Ht 68.5 in | Wt 160.2 lb

## 2018-12-11 DIAGNOSIS — E039 Hypothyroidism, unspecified: Secondary | ICD-10-CM | POA: Diagnosis not present

## 2018-12-11 DIAGNOSIS — F3181 Bipolar II disorder: Secondary | ICD-10-CM

## 2018-12-11 DIAGNOSIS — Z13220 Encounter for screening for lipoid disorders: Secondary | ICD-10-CM | POA: Diagnosis not present

## 2018-12-11 DIAGNOSIS — G4700 Insomnia, unspecified: Secondary | ICD-10-CM | POA: Diagnosis not present

## 2018-12-11 DIAGNOSIS — R5383 Other fatigue: Secondary | ICD-10-CM

## 2018-12-11 NOTE — Patient Instructions (Addendum)
Please stop by lab before you go If you do not have mychart- we will call you about results within 5 business days of [place] receiving them.  If you have mychart- we will send your results within 3 business days of [place] receiving them.  If abnormal or we want to clarify a result, we will call or mychart you to make sure you receive the message.  If you have questions or concerns or don't hear within 5-7 days, please send [place] a message or call us.    We will increase venlafaxine to 75 mg if labs are ok

## 2018-12-11 NOTE — Progress Notes (Signed)
Phone [phone]   Subjective:  Dorothy Lynch is a 58 y.o. year old very pleasant female patient who presents for/with See problem oriented charting Chief Complaint  Patient presents with  . Fatigue    Not fasting today.    ROS-complains of fatigue, hair loss.  No shortness of breath reported.  Insomnia reported but does not feel rested if misses sleep  Past Medical History-  Patient Active Problem List   Diagnosis Date Noted  . Bipolar II disorder ([place]) 01/18/2011    Priority: High  . Menopausal symptoms 10/14/2015    Priority: Medium  . Hypothyroidism 10/14/2015    Priority: Medium  . Insomnia 01/18/2011    Priority: Medium  . Constipation 12/19/2017    Priority: Low  . Allergic rhinitis 10/14/2015    Priority: Low  . Yeast infection 10/14/2015    Priority: Low  . Primary osteoarthritis of left hip 01/24/2015    Priority: Low  . History of squamous cell carcinoma of skin     Priority: Low    Medications- reviewed and updated Current Outpatient Medications  Medication Sig Dispense Refill  . BIOTIN PO Take 1 tablet by mouth daily.     . Cholecalciferol (VITAMIN D3) 2000 units TABS Take 1 tablet by mouth daily.    Marland Kitchen escitalopram (LEXAPRO) 10 MG tablet TAKE 1 TABLET BY MOUTH EVERY DAY 30 tablet 0  . estradiol (ESTRACE) 1 MG tablet Take 2 tablets (2 mg total) by mouth 2 (two) times daily. 124 tablet 10  . fexofenadine (ALLERGY RELIEF) 180 MG tablet TAKE 1 TABLET BY MOUTH EVERY DAY PRN 30 tablet 6  . Glucosamine HCl (GLUCOSAMINE PO) Take 1 tablet by mouth daily.    Marland Kitchen lamoTRIgine (LAMICTAL) 100 MG tablet TAKE 1 TABLET BY MOUTH 2 TIMES DAILY 60 tablet 0  . levothyroxine (SYNTHROID, LEVOTHROID) 75 MCG tablet TAKE 1 TABLET BY MOUTH EVERY DAY BEFORE BREAKFAST 90 tablet 2  . NONFORMULARY OR COMPOUNDED ITEM Boric acid suppository 600 mg once vaginally twice weekly 30 each 2  . Omega-3 Fatty Acids (FISH OIL PO) Take 1 capsule by mouth daily.     Vladimir Faster Glycol-Propyl Glycol  (SYSTANE OP) Place 1 drop into both eyes daily as needed (for dry eyes).    . Probiotic Product (PROBIOTIC PO) Take 2 tablets by mouth daily.     . progesterone (PROMETRIUM) 100 MG capsule TAKE 1 CAPSULE BY MOUTH AT BEDTIME 30 capsule 10  . tretinoin (RETIN-A) 0.05 % cream Apply topically at bedtime. 45 g 1  . venlafaxine XR (EFFEXOR-XR) 37.5 MG 24 hr capsule TAKE 1 CAPSULE BY MOUTH EVERY DAY 30 capsule 0  . zolpidem (AMBIEN) 10 MG tablet TAKE 1 TABLET BY MOUTH AT BEDTIME AS NEEDED FOR SLEEP 30 tablet 2   No current facility-administered medications for this visit.      Objective:  BP 102/76 (BP Location: Left Arm, Patient Position: Sitting, Cuff Size: Normal)   Pulse (!) 59   Temp 98.8 F (37.1 C) (Oral)   Ht 5' 8.5" (1.74 m)   Wt 160 lb 3.2 oz (72.7 kg)   LMP 09/28/2008   SpO2 95%   BMI 24.00 kg/m  Gen: NAD, resting comfortably CV: RRR no murmurs rubs or gallops Lungs: CTAB no crackles, wheeze, rhonchi Abdomen: soft/nontender/nondistended/normal bowel sounds.  Ext: no edema Skin: warm, dry Neuro: grossly normal, moves all extremities     Assessment and Plan   # Fatigue/bipolar disorder II/insomnia S:Pt c/o sx x several years. She has  been going through a lot of emotional distress.  She's divorced her husband, taking care of her mom- who is stuck at [facility], lost 3 jobs (lost 2, then lost 1 with covid 40) and 3 pets unexpectedly . Reports that hair has been falling out x several years. She does have difficulty sleeping. She takes Ambien 6 nights a week and sleeping off and on for 8 hours. She denies changes to skin or nails. Slight weight gain. She is exercising 5-6 x weekly (can wake up 3-4 x a night still). No dietary changes. Looking for jobs now.   She states has extreme fatigue off and on for years- particularly for last 6 months. She states fatigue started before the trauma. Really lethargic/tired at least 3 days a month now - used to be once a month prior to 6 months  ago.   10 years on current regimen include lexapro 10mg , Lamictal 100mg  BID, venlafaxine 37.5mg  XR  Will have high energy for a few days but no dangerous activities or staying up late- crashes at night and sleeps well. Gets excited about things but then crashes after activity has passed.   Has seen [facility] counseling in the past but has not seen in 6 months.  Has follow-up with Dr. Meda Coffee of psychology in the past-states has been year since last visit but he helped contract her last medication regimen along with Dr. Leanne Chang. A/P: Patient currently on Lexapro 10 mg, Lamictal 100 mg twice daily, venlafaxine 37.5 mg extended release.  PHQ 9 elevated today- no obvious hypomania or mania and depression seems to be the main issue- will titrate venlafaxine to 75 mg if labs are reassuring.  For fatigue-update labs as below -Encouraged her to follow-up with Dr. Meda Coffee and/or [facility] counseling -She asks about changing Ambien but I do not really have a good alternative for this especially since she is on high-dose 10 mg- honestly may need to reduce this down in coming years.  #hypothyroidism S: On thyroid medication-levothyroxine 75 mcg. Hair falling out for years- has seen endcorine in the past but thyroid not deemed to be the cause Lab Results  Component Value Date   TSH 3.68 10/28/2017   A/P: We will make sure thyroid not the cause of her feeling fatigued rechecking TSH today-suspect stable low.   #Lipid/screening 2016-update nonfasting lipids with labs today  Recommended follow up:  Physical 10 days and we will see how shes feeling Future Appointments  Date Time Provider [place]  12/21/2018  2:40 PM Marin Olp, MD [facility]  01/22/2019  3:30 PM Fontaine, Belinda Block, MD [facility]   Lab/Order associations: NOT fasting   ICD-10-CM   1. Fatigue, unspecified type  R53.83 CBC    Comprehensive metabolic panel    TSH    Vitamin B12    VITAMIN D 25 Hydroxy (Vit-D  Deficiency, Fractures)  2. Hypothyroidism, unspecified type  E03.9 CBC    Comprehensive metabolic panel    TSH  3. Bipolar II disorder (HCC)  F31.81 CBC    Comprehensive metabolic panel  4. Insomnia, unspecified type  G47.00   5. Screening for hyperlipidemia  Z13.220 Lipid panel   Return precautions advised.  Garret Reddish, MD

## 2018-12-11 NOTE — Telephone Encounter (Signed)
Pt has appointment today.  

## 2018-12-12 LAB — CBC
HCT: 38.1 % (ref 36.0–46.0)
Hemoglobin: 12.8 g/dL (ref 12.0–15.0)
MCHC: 33.5 g/dL (ref 30.0–36.0)
MCV: 97.2 fl (ref 78.0–100.0)
Platelets: 240 10*3/uL (ref 150.0–400.0)
RBC: 3.92 Mil/uL (ref 3.87–5.11)
RDW: 13 % (ref 11.5–15.5)
WBC: 6.8 10*3/uL (ref 4.0–10.5)

## 2018-12-12 LAB — LIPID PANEL
Cholesterol: 195 mg/dL (ref 0–200)
HDL: 80.1 mg/dL (ref >39.00)
NonHDL: 115.29
Total CHOL/HDL Ratio: 2
Triglycerides: 239 mg/dL — ABNORMAL HIGH (ref 0.0–149.0)
VLDL: 47.8 mg/dL — ABNORMAL HIGH (ref 0.0–40.0)

## 2018-12-12 LAB — COMPREHENSIVE METABOLIC PANEL
ALT: 11 U/L (ref 0–35)
AST: 13 U/L (ref 0–37)
Albumin: 3.9 g/dL (ref 3.5–5.2)
Alkaline Phosphatase: 58 U/L (ref 39–117)
BUN: 16 mg/dL (ref 6–23)
CO2: 28 mEq/L (ref 19–32)
Calcium: 9.1 mg/dL (ref 8.4–10.5)
Chloride: 102 mEq/L (ref 96–112)
Creatinine, Ser: 0.8 mg/dL (ref 0.40–1.20)
GFR: 73.29 mL/min (ref >60.00)
Glucose, Bld: 86 mg/dL (ref 70–99)
Potassium: 4.4 mEq/L (ref 3.5–5.1)
Sodium: 137 mEq/L (ref 135–145)
Total Bilirubin: 0.3 mg/dL (ref 0.2–1.2)
Total Protein: 6.3 g/dL (ref 6.0–8.3)

## 2018-12-12 LAB — VITAMIN D 25 HYDROXY (VIT D DEFICIENCY, FRACTURES): VITD: 50.93 ng/mL (ref 30.00–100.00)

## 2018-12-12 LAB — TSH: TSH: 2.71 u[IU]/mL (ref 0.35–4.50)

## 2018-12-12 LAB — LDL CHOLESTEROL, DIRECT: Direct LDL: 82 mg/dL

## 2018-12-12 LAB — VITAMIN B12: Vitamin B-12: 203 pg/mL — ABNORMAL LOW (ref 211–911)

## 2018-12-14 ENCOUNTER — Ambulatory Visit (INDEPENDENT_AMBULATORY_CARE_PROVIDER_SITE_OTHER): Payer: PRIVATE HEALTH INSURANCE

## 2018-12-14 ENCOUNTER — Other Ambulatory Visit: Payer: Self-pay

## 2018-12-14 DIAGNOSIS — E538 Deficiency of other specified B group vitamins: Secondary | ICD-10-CM | POA: Diagnosis not present

## 2018-12-14 MED ORDER — CYANOCOBALAMIN 1000 MCG/ML IJ SOLN
1000.0000 ug | Freq: Once | INTRAMUSCULAR | Status: AC
  Administered 2018-12-14: 11:00:00 1000 ug via INTRAMUSCULAR

## 2018-12-14 MED ORDER — VENLAFAXINE HCL ER 75 MG PO CP24: 75.0000 mg | ORAL_CAPSULE | Freq: Every day | ORAL | 3 refills | Status: DC

## 2018-12-14 NOTE — Progress Notes (Signed)
Per orders of Dr. Yong Channel, injection of vitamin B12 1000 mcg given in left deltoid by Gertie Exon, CMA.  Patient tolerated injection well.  She will return in 1 week for next injection.  Also sent in venlafaxine 75 mg ER #90 with 3 refills and instructed patient to stop 37.5 mg dose per Dr. Ansel Bong instructions.  Patient verbalized understanding.

## 2018-12-14 NOTE — Telephone Encounter (Signed)
Spoke with pt yesterday and went over lab results/recommendations.

## 2018-12-21 ENCOUNTER — Ambulatory Visit (INDEPENDENT_AMBULATORY_CARE_PROVIDER_SITE_OTHER): Payer: PRIVATE HEALTH INSURANCE | Admitting: Family Medicine

## 2018-12-21 ENCOUNTER — Other Ambulatory Visit: Payer: Self-pay

## 2018-12-21 ENCOUNTER — Encounter: Payer: Self-pay | Admitting: Family Medicine

## 2018-12-21 VITALS — BP 98/62 | HR 56 | Temp 98.6°F | Ht 68.0 in | Wt 159.8 lb

## 2018-12-21 DIAGNOSIS — E538 Deficiency of other specified B group vitamins: Secondary | ICD-10-CM | POA: Diagnosis not present

## 2018-12-21 DIAGNOSIS — Z0000 Encounter for general adult medical examination without abnormal findings: Secondary | ICD-10-CM | POA: Diagnosis not present

## 2018-12-21 MED ORDER — CYANOCOBALAMIN 1000 MCG/ML IJ SOLN
1000.0000 ug | Freq: Once | INTRAMUSCULAR | Status: AC
  Administered 2018-12-21: 1000 ug via INTRAMUSCULAR

## 2018-12-21 NOTE — Progress Notes (Signed)
Phone: [phone]   Subjective:  Patient presents today for their annual physical. Chief complaint-noted.   See problem oriented charting- ROS- full  review of systems was completed and negative except for: abd pain, fatigue, re eyes, allergies, sad mood, bloating, decreased conc, sleep disturbance  The following were reviewed and entered/updated in epic: Past Medical History:  Diagnosis Date   Anxiety    Arthritis    ASCUS of cervix with negative high risk HPV 12/2016   Bipolar 2 disorder (HCC)    Chronic venous insufficiency    superficial---  s/p  failed attempt sclerotherapy   DDD (degenerative disc disease)    Depression    Endometrial polyp    History of basal cell carcinoma (BCC) excision    08-30-2014  left mid medial chest   History of squamous cell carcinoma excision    2016 approx. x3 shoulder area/   left lower leg , shin 02-02-2017 approx.   Hypothyroidism    Macular degeneration    PMB (postmenopausal bleeding)    PONV (postoperative nausea and vomiting)    Scoliosis    Varicose vein of leg    Patient Active Problem List   Diagnosis Date Noted   Bipolar II disorder ([place]) 01/18/2011    Priority: High   Menopausal symptoms 10/14/2015    Priority: Medium   Hypothyroidism 10/14/2015    Priority: Medium   Insomnia 01/18/2011    Priority: Medium   Constipation 12/19/2017    Priority: Low   Allergic rhinitis 10/14/2015    Priority: Low   Yeast infection 10/14/2015    Priority: Low   Primary osteoarthritis of left hip 01/24/2015    Priority: Low   History of squamous cell carcinoma of skin     Priority: Low   Past Surgical History:  Procedure Laterality Date   ACHILLES TENDON SURGERY  2001   BREAST SURGERY  march 2011   breast bx-B-9, fibrocyctic changes   COLONOSCOPY  02-19-2010  dr Sydell Axon brodie   DILATATION & CURETTAGE/HYSTEROSCOPY WITH MYOSURE N/A 02/21/2017   Procedure: DILATATION & CURETTAGE/HYSTEROSCOPY WITH  MYOSURE;  Surgeon: Anastasio Auerbach, MD;  Location: [facility];  Service: Gynecology;  Laterality: N/A;   FACIAL COSMETIC SURGERY  2012   INGUINAL HERNIA REPAIR Left 1995   2 hernia's on left side   TOTAL HIP ARTHROPLASTY Left 01/24/2015   Procedure: TOTAL HIP ARTHROPLASTY ANTERIOR APPROACH;  Surgeon: Dorna Leitz, MD;  Location: [facility];  Service: Orthopedics;  Laterality: Left;    Family History  Problem Relation Age of Onset   Diabetes Father    Hypertension Father    Stroke Father 25       died at 80   Thyroid disease Mother    Breast cancer Mother 7       84 in 2017, survived breast cancer   Heart failure Mother    Cancer Maternal Grandmother 63       lung, smoker   Stroke Maternal Grandfather    Heart disease Paternal Grandmother        mi   CVA Brother        27s smoking   Alcohol abuse Brother        recovering    Medications- reviewed and updated Current Outpatient Medications  Medication Sig Dispense Refill   BIOTIN PO Take 1 tablet by mouth daily.      Cholecalciferol (VITAMIN D3) 2000 units TABS Take 1 tablet by mouth daily.     escitalopram (  LEXAPRO) 10 MG tablet TAKE 1 TABLET BY MOUTH EVERY DAY 30 tablet 0   estradiol (ESTRACE) 1 MG tablet Take 2 tablets (2 mg total) by mouth 2 (two) times daily. 124 tablet 10   fexofenadine (ALLERGY RELIEF) 180 MG tablet TAKE 1 TABLET BY MOUTH EVERY DAY PRN 30 tablet 6   Glucosamine HCl (GLUCOSAMINE PO) Take 1 tablet by mouth daily.     lamoTRIgine (LAMICTAL) 100 MG tablet TAKE 1 TABLET BY MOUTH 2 TIMES DAILY 60 tablet 0   levothyroxine (SYNTHROID, LEVOTHROID) 75 MCG tablet TAKE 1 TABLET BY MOUTH EVERY DAY BEFORE BREAKFAST 90 tablet 2   NONFORMULARY OR COMPOUNDED ITEM Boric acid suppository 600 mg once vaginally twice weekly 30 each 2   Omega-3 Fatty Acids (FISH OIL PO) Take 1 capsule by mouth daily.      Polyethyl Glycol-Propyl Glycol (SYSTANE OP) Place 1 drop into both eyes daily as  needed (for dry eyes).     Probiotic Product (PROBIOTIC PO) Take 2 tablets by mouth daily.      progesterone (PROMETRIUM) 100 MG capsule TAKE 1 CAPSULE BY MOUTH AT BEDTIME 30 capsule 10   tretinoin (RETIN-A) 0.05 % cream Apply topically at bedtime. 45 g 1   venlafaxine XR (EFFEXOR XR) 75 MG 24 hr capsule Take 1 capsule (75 mg total) by mouth daily with breakfast. 90 capsule 3   zolpidem (AMBIEN) 10 MG tablet TAKE 1 TABLET BY MOUTH AT BEDTIME AS NEEDED FOR SLEEP 30 tablet 2   No current facility-administered medications for this visit.     Allergies-reviewed and updated Allergies  Allergen Reactions   Penicillins Other (See Comments)    Headaches    Sulfa Antibiotics Other (See Comments)    migraines    Social History   Social History Narrative   Married (met husband Jenny Reichmann- will be establishing). 2 children. Daughter Sonia Baller is my patient- works in [place]. Son in [place].        Currently unemployed- looking for work [place]: former running, walks, gardens, entertains a lot, cook   Objective  Objective:  BP 98/62    Pulse (!) 56    Temp 98.6 F (37 C) (Oral)    Ht 5' 8"  (1.727 m)    Wt 159 lb 12.8 oz (72.5 kg)    LMP 09/28/2008    SpO2 94%    BMI 24.30 kg/m  Gen: NAD, resting comfortably HEENT: Mucous membranes are moist. Oropharynx normal Neck: no thyromegaly or cervical lymphadenopathy CV: RRR no murmurs rubs or gallops Lungs: CTAB no crackles, wheeze, rhonchi Abdomen: soft/nontender/nondistended/normal bowel sounds. No rebound or guarding.  Ext: no edema and 2+ PT pulses Skin: warm, dry Neuro: grossly normal, moves all extremities, PERRLA    Assessment and Plan    58 y.o. female presenting for annual physical.  Health Maintenance counseling: 1. Anticipatory guidance: Patient counseled regarding regular dental exams -q6 months, eye exams - yearly,  avoiding smoking and second hand smoke , limiting alcohol to 1 beverage per day .    2. Risk factor reduction:  Advised patient of need for regular exercise and diet rich and fruits and vegetables to reduce risk of heart attack and stroke. Exercise- limited due to back right now but really good in general. Diet-  Weight largely stable from last year. Eating reasonably healthy diet.  Wt Readings from Last 3 Encounters:  12/21/18 159 lb 12.8 oz (72.5 kg)  12/11/18 160  lb 3.2 oz (72.7 kg)  01/19/18 156 lb (70.8 kg)  3. Immunizations/screenings/ancillary studies- asked her to check with insurance about shingrix  Immunization History  Administered Date(s) Administered   Tdap 10/14/2015   4. Cervical cancer screening- patient with mild atypia on Pap 2018 and 2019-colposcopy was recommended and was adequate and normal with no biopsies as of 02/15/18 note so plan was 1 year follow up which is scheduled- will have pap at tha ttime.   5. Breast cancer screening-  breast exam with Dr. Phineas Real and mammogram January 19th 2020  6. Colon cancer screening - February 19, 2010 with 10-year repeat 7. Skin cancer screening- sees Dr. Ubaldo Glassing. advised regular sunscreen use. Denies worrisome, changing, or new skin lesions.  8. Birth control/STD check- postmenopausal.  Monogamous only with current partner-she has declined STD testing- declines again 9. Osteoporosis screening at 21- we will plan on this. Mom has osteoporosis- she will check with mom when diagnosed- consider early bone density  10.  Never smoker  Status of chronic or acute concerns  Hypothyroidism - Taking Levothyroxine 75 mcg daily.  Lab Results  Component Value Date   TSH 2.71 12/11/2018  - spoke with dermatology and they were unclear of cause- does not appear to be thyroid though for hair loss   Bipolar II Disorder - Taking Escitalopram 10 mg daily, Lamictal 100 mg BID, and venlafaxine XR 75 mg daily-recently titrated up from 37.5 mg. She is depressed still- stressed with not having $ coming in- not excited much about anything- 2  month follow up advised  B12 deficiency-could be causing fatigue and depressed mood sensation- she is doing weekly injections at present. She is having some fatigue.   Insomnia - Taking Zolpidem 10 mg qhs prn.   Mild hyperlipidemia-10-year ASCVD risk only 1.3%-remain off statin for now and continue efforts for healthy eating and regular exercise The 10-year ASCVD risk score Mikey Bussing DC Brooke Bonito., et al., 2013) is: 1.3%   Values used to calculate the score:     Age: 18 years     Sex: Female     Is Non-Hispanic African American: No     Diabetic: No     Tobacco smoker: No     Systolic Blood Pressure: 98 mmHg     Is BP treated: No     HDL Cholesterol: 80.1 mg/dL     Total Cholesterol: 195 mg/dL   Right low back pain- starting 3 days ago. No fall or injury. Has seen Dr. Belia Heman- mild help. Ice seems to help. Discussed likely back strain with muscle spasm. Is tender over SI joint as well- discussed possible 4-6 week for healing if persistent beyond that should let us know. Hoping she is on the shorter side for healing. Hurts to jog so asked to hold off for now.   Intermittent bloating and constipation- takes metamucil twice a day for a few days until she goes to the bathroom. Intermittent issue. Could try metamucil for a day once a week. Also can use gas x.   Recommended follow up: 2 months follow-up recommended due to recent psychiatric medicine changes ( can do 6 weeks if preferred)  Lab/Order associations: have been done as of last visit     ICD-10-CM   1. Preventative health care  Z00.00   2. B12 deficiency  E53.8 cyanocobalamin ((VITAMIN B-12)) injection 1,000 mcg    Meds ordered this encounter  Medications   cyanocobalamin ((VITAMIN B-12)) injection 1,000 mcg    Return precautions  advised.  Garret Reddish, MD

## 2018-12-21 NOTE — Patient Instructions (Addendum)
6 week to 2 months follow up  Cyanocobalamin  (B12) given at visit today  For front desk- I am willing to accept Patient's significant other/boyfriend, Merrilee Seashore Piornack  No other changes today but please let me know if any worsening depression

## 2018-12-28 ENCOUNTER — Other Ambulatory Visit: Payer: Self-pay

## 2018-12-28 ENCOUNTER — Ambulatory Visit: Payer: PRIVATE HEALTH INSURANCE

## 2018-12-28 ENCOUNTER — Ambulatory Visit (INDEPENDENT_AMBULATORY_CARE_PROVIDER_SITE_OTHER): Payer: PRIVATE HEALTH INSURANCE

## 2018-12-28 DIAGNOSIS — E538 Deficiency of other specified B group vitamins: Secondary | ICD-10-CM

## 2018-12-28 MED ORDER — CYANOCOBALAMIN 1000 MCG/ML IJ SOLN
1000.0000 ug | Freq: Once | INTRAMUSCULAR | Status: AC
  Administered 2018-12-28: 1000 ug via INTRAMUSCULAR

## 2018-12-28 NOTE — Progress Notes (Signed)
Per orders of Dr. Juleen China, injection of B12 injection given by Brendell L Tyus in left deltoid. Patient tolerated injection well. Patient will make appointment for 1 week.

## 2018-12-28 NOTE — Patient Instructions (Signed)
There are no preventive care reminders to display for this patient.  Depression screen [facility] 2/9 12/11/2018 12/19/2017 10/28/2017  Decreased Interest 2 0 1  Down, Depressed, Hopeless 1 0 1  PHQ - 2 Score 3 0 2  Altered sleeping 2 2 2   Tired, decreased energy 1 1 1   Change in appetite 0 0 0  Feeling bad or failure about yourself  0 1 1  Trouble concentrating 1 1 2   Moving slowly or fidgety/restless 1 0 1  Suicidal thoughts 0 0 0  PHQ-9 Score 8 5 9   Difficult doing work/chores Somewhat difficult Somewhat difficult Somewhat difficult

## 2019-01-04 ENCOUNTER — Ambulatory Visit: Payer: PRIVATE HEALTH INSURANCE

## 2019-01-04 ENCOUNTER — Other Ambulatory Visit: Payer: Self-pay

## 2019-01-04 ENCOUNTER — Ambulatory Visit (INDEPENDENT_AMBULATORY_CARE_PROVIDER_SITE_OTHER): Payer: PRIVATE HEALTH INSURANCE

## 2019-01-04 DIAGNOSIS — E538 Deficiency of other specified B group vitamins: Secondary | ICD-10-CM | POA: Diagnosis not present

## 2019-01-04 MED ORDER — CYANOCOBALAMIN 1000 MCG/ML IJ SOLN
1000.0000 ug | Freq: Once | INTRAMUSCULAR | Status: AC
  Administered 2019-01-04: 1000 ug via INTRAMUSCULAR

## 2019-01-04 NOTE — Progress Notes (Signed)
Per orders of Dr. Yong Channel, injection of Vitamin b12 1000 mcg given right deltoid IM by Naoma Diener Patient tolerated injection well.  She will continue with an OTC b12 supplement of 1000 mcg until she comes in for her next visit and her levels will be repeated at that time.

## 2019-01-20 ENCOUNTER — Other Ambulatory Visit: Payer: Self-pay | Admitting: Gynecology

## 2019-01-20 ENCOUNTER — Other Ambulatory Visit: Payer: Self-pay | Admitting: Family Medicine

## 2019-01-22 ENCOUNTER — Other Ambulatory Visit: Payer: Self-pay

## 2019-01-22 ENCOUNTER — Ambulatory Visit (INDEPENDENT_AMBULATORY_CARE_PROVIDER_SITE_OTHER): Payer: 59 | Admitting: Gynecology

## 2019-01-22 ENCOUNTER — Encounter: Payer: Self-pay | Admitting: Gynecology

## 2019-01-22 VITALS — BP 118/76 | Ht 68.0 in | Wt 159.0 lb

## 2019-01-22 DIAGNOSIS — Z79890 Hormone replacement therapy (postmenopausal): Secondary | ICD-10-CM | POA: Diagnosis not present

## 2019-01-22 DIAGNOSIS — R87610 Atypical squamous cells of undetermined significance on cytologic smear of cervix (ASC-US): Secondary | ICD-10-CM | POA: Diagnosis not present

## 2019-01-22 DIAGNOSIS — N952 Postmenopausal atrophic vaginitis: Secondary | ICD-10-CM | POA: Diagnosis not present

## 2019-01-22 DIAGNOSIS — Z01419 Encounter for gynecological examination (general) (routine) without abnormal findings: Secondary | ICD-10-CM | POA: Diagnosis not present

## 2019-01-22 MED ORDER — ESTRADIOL 1 MG PO TABS: 2.0000 mg | ORAL_TABLET | Freq: Two times a day (BID) | ORAL | 12 refills | Status: DC

## 2019-01-22 MED ORDER — PROGESTERONE MICRONIZED 100 MG PO CAPS: 100.0000 mg | ORAL_CAPSULE | Freq: Every day | ORAL | 12 refills | Status: DC

## 2019-01-22 MED ORDER — ZOLPIDEM TARTRATE 10 MG PO TABS: 10.0000 mg | ORAL_TABLET | Freq: Every evening | ORAL | 2 refills | Status: DC | PRN

## 2019-01-22 NOTE — Progress Notes (Signed)
    Dorothy Lynch [birth year] [id number]        58 y.o.  [id number] for annual gynecologic exam.  Without gynecologic complaints.  Has had 2 ASCUS negative high risk HPV Pap smears in a row.  Colposcopy last year was adequate and normal.  Past medical history,surgical history, problem list, medications, allergies, family history and social history were all reviewed and documented as reviewed in the EPIC chart.  ROS:  Performed with pertinent positives and negatives included in the history, assessment and plan.   Additional significant findings : None   Exam: Caryn Bee assistant Vitals:   01/22/19 1533  BP: 118/76  Weight: 159 lb (72.1 kg)  Height: 5\' 8"  (1.727 m)   Body mass index is 24.18 kg/m.  General appearance:  Normal affect, orientation and appearance. Skin: Grossly normal HEENT: Without gross lesions.  No cervical or supraclavicular adenopathy. Thyroid normal.  Lungs:  Clear without wheezing, rales or rhonchi Cardiac: RR, without RMG Abdominal:  Soft, nontender, without masses, guarding, rebound, organomegaly or hernia Breasts:  Examined lying and sitting without masses, retractions, discharge or axillary adenopathy. Pelvic:  Ext, BUS, Vagina: With atrophic changes  Cervix: With atrophic changes  Uterus: Anteverted, normal size, shape and contour, midline and mobile nontender   Adnexa: Without masses or tenderness    Anus and perineum: Normal   Rectovaginal: Normal sphincter tone without palpated masses or tenderness.    Assessment/Plan:  58 y.o. [id number] female for annual gynecologic exam.   1. Postmenopausal/HRT.  Continues on estradiol 2 mg twice daily to control her menopausal symptoms.  Also Prometrium 100 mg nightly.  Has done no bleeding.  We again discussed risks versus benefits to include thrombosis in the breast cancer issue.  At this point she would like to continue and I refilled her x1 year. 2. Insomnia.  Uses one half of an Ambien nightly.  Has done so for  years with no side effects or sleep activities.  #30 with 2 refills provided. 3. Colonoscopy 2011.  Plan repeat next year at 10-year interval. 4. Mammography 05/2018.  Continue with annual mammography when due.  Breast exam normal today. 5. Pap smear ASCUS negative high risk HPV x2 in 2018 and 2019.  Colposcopy was adequate normal.  Pap smear done today. 6. DEXA never.  Will plan next year at age 25. 50. Health maintenance.  No routine lab work done as patient recently had this done through Dr. Ansel Bong office.  Follow-up 1 year, sooner as needed.   Anastasio Auerbach MD, 3:58 PM 01/22/2019

## 2019-01-22 NOTE — Addendum Note (Signed)
Addended by: Nelva Nay on: 01/22/2019 04:37 PM   Modules accepted: Orders

## 2019-01-22 NOTE — Patient Instructions (Signed)
Follow-up in 1 year for annual exam, sooner as needed. 

## 2019-01-24 ENCOUNTER — Encounter: Payer: Self-pay | Admitting: Gynecology

## 2019-01-24 LAB — PAP IG W/ RFLX HPV ASCU

## 2019-02-06 ENCOUNTER — Ambulatory Visit: Payer: PRIVATE HEALTH INSURANCE

## 2019-02-13 ENCOUNTER — Other Ambulatory Visit: Payer: Self-pay

## 2019-02-13 ENCOUNTER — Ambulatory Visit (INDEPENDENT_AMBULATORY_CARE_PROVIDER_SITE_OTHER): Payer: PRIVATE HEALTH INSURANCE

## 2019-02-13 DIAGNOSIS — E538 Deficiency of other specified B group vitamins: Secondary | ICD-10-CM

## 2019-02-13 MED ORDER — CYANOCOBALAMIN 1000 MCG/ML IJ SOLN
1000.0000 ug | Freq: Once | INTRAMUSCULAR | Status: AC
  Administered 2019-02-13: 1000 ug via INTRAMUSCULAR

## 2019-02-13 NOTE — Progress Notes (Signed)
Per orders of Dr. Yong Channel, injection of Vitamin b12, 1000 mcg given left deltoid IM by Kevan Ny, CMA. Patient tolerated injection well.  Patient's last injection was 8/6; she was supposed to transition to taking 1000 mcg of b12 OTC x 1 month after her last injection per Dr. Yong Channel.  Patient misunderstood and has not been taking an OTC supplement and instead, came in for injection today.  Per Theadora Rama, Dr. Ansel Bong assistant, okay to give injection today.

## 2019-02-22 ENCOUNTER — Other Ambulatory Visit: Payer: Self-pay

## 2019-02-22 ENCOUNTER — Encounter: Payer: Self-pay | Admitting: Family Medicine

## 2019-02-22 ENCOUNTER — Ambulatory Visit (INDEPENDENT_AMBULATORY_CARE_PROVIDER_SITE_OTHER): Payer: PRIVATE HEALTH INSURANCE | Admitting: Family Medicine

## 2019-02-22 VITALS — BP 102/64 | HR 63 | Temp 98.1°F | Ht 68.0 in | Wt 156.8 lb

## 2019-02-22 DIAGNOSIS — E538 Deficiency of other specified B group vitamins: Secondary | ICD-10-CM

## 2019-02-22 DIAGNOSIS — F3181 Bipolar II disorder: Secondary | ICD-10-CM | POA: Diagnosis not present

## 2019-02-22 DIAGNOSIS — E039 Hypothyroidism, unspecified: Secondary | ICD-10-CM | POA: Diagnosis not present

## 2019-02-22 NOTE — Progress Notes (Signed)
Phone [phone]   Subjective:  Dorothy Lynch is a 58 y.o. year old very pleasant female patient who presents for/with See problem oriented charting Chief Complaint  Patient presents with  . follow up  . b12 deficiency  . Hypothyroidism  . bipolar disorder   ROS- low concentration- but feels like this could be from starting new job and many new things to learn, lower than normal but improving energy but not taking b12 . No fever/fever/chills/cough/shortness of breath/chest pain   Past Medical History-  Patient Active Problem List   Diagnosis Date Noted  . Bipolar II disorder ([place]) 01/18/2011    Priority: High  . Menopausal symptoms 10/14/2015    Priority: Medium  . Hypothyroidism 10/14/2015    Priority: Medium  . Insomnia 01/18/2011    Priority: Medium  . Constipation 12/19/2017    Priority: Low  . Allergic rhinitis 10/14/2015    Priority: Low  . Yeast infection 10/14/2015    Priority: Low  . Primary osteoarthritis of left hip 01/24/2015    Priority: Low  . History of squamous cell carcinoma of skin     Priority: Low  . Vitamin B12 deficiency 02/22/2019    Medications- reviewed and updated Current Outpatient Medications  Medication Sig Dispense Refill  . BIOTIN PO Take 1 tablet by mouth daily.     . Cholecalciferol (VITAMIN D3) 2000 units TABS Take 1 tablet by mouth daily.    Marland Kitchen escitalopram (LEXAPRO) 10 MG tablet TAKE 1 TABLET BY MOUTH EVERY DAY 90 tablet 1  . estradiol (ESTRACE) 1 MG tablet Take 2 tablets (2 mg total) by mouth 2 (two) times daily. 124 tablet 12  . fexofenadine (ALLERGY RELIEF) 180 MG tablet TAKE 1 TABLET BY MOUTH EVERY DAY PRN 30 tablet 6  . Glucosamine HCl (GLUCOSAMINE PO) Take 1 tablet by mouth daily.    Marland Kitchen lamoTRIgine (LAMICTAL) 100 MG tablet TAKE 1 TABLET BY MOUTH 2 TIMES DAILY 180 tablet 1  . levothyroxine (SYNTHROID, LEVOTHROID) 75 MCG tablet TAKE 1 TABLET BY MOUTH EVERY DAY BEFORE BREAKFAST 90 tablet 2  . NONFORMULARY OR COMPOUNDED ITEM Boric  acid suppository 600 mg once vaginally twice weekly 30 each 2  . Omega-3 Fatty Acids (FISH OIL PO) Take 1 capsule by mouth daily.     Vladimir Faster Glycol-Propyl Glycol (SYSTANE OP) Place 1 drop into both eyes daily as needed (for dry eyes).    . Probiotic Product (PROBIOTIC PO) Take 2 tablets by mouth daily.     . progesterone (PROMETRIUM) 100 MG capsule Take 1 capsule (100 mg total) by mouth at bedtime. 30 capsule 12  . tretinoin (RETIN-A) 0.05 % cream Apply topically at bedtime. 45 g 1  . venlafaxine XR (EFFEXOR XR) 75 MG 24 hr capsule Take 1 capsule (75 mg total) by mouth daily with breakfast. 90 capsule 3  . zolpidem (AMBIEN) 10 MG tablet Take 1 tablet (10 mg total) by mouth at bedtime as needed. for sleep 30 tablet 2   No current facility-administered medications for this visit.      Objective:  BP 102/64   Pulse 63   Temp 98.1 F (36.7 C)   Ht 5\' 8"  (1.727 m)   Wt 156 lb 12.8 oz (71.1 kg)   LMP 09/28/2008   SpO2 98%   BMI 23.84 kg/m  Gen: NAD, resting comfortably CV: RRR  Lungs: nonlabored, normal respiratory rate Abdomen: soft/nondistended Skin: warm, dry    Assessment and Plan  # B12 deficency S: pt states she  has noticed a decrease in her energy levels.  Patient was on weekly injections for 1 month. She forgot to start oral medications after finishing medications.  Lab Results  Component Value Date   [id number] (L) 12/11/2018  A/P: poor energy could be from low b12 -offered rechecking b12- she would prefer to start oral b12 1035mcg daily  # Hypothyroidism-controlled on synthroid 75mcg S:pt states her hair has been falling out and thinning out over the past few years Lab Results  Component Value Date   TSH 2.71 12/11/2018  A/P: Last TSH was in normal range and just 2 months ago-she would like to repeat today  # Bipolar Disorder S:Patient remains on escitalopram 10 mg, Lamictal 100 mg twice a day, venlafaxine 75 mg standard release which has been titrated up  from 37.5 mg.  She takes Ambien 10 mg for sleep  At last visit was dealing with distress from not much money coming in and also having anhedonia.  Labs were largely reassuring other than low B12 on July 13.  At present, big development- Big relief with getting new job. Still waiting on next steps with separation phase- ex won't move out of large home and really limites finances. pt states she has had low energy and few days where she has felt depressed but much improved overall.  pt states she is having problems staying focused at work and it has gotten worse over time noticing it more than usual starting a month ago. PHQ9 SCORE ONLY 02/22/2019 12/11/2018 12/19/2017  Score 6 8 5   A/P:  No SI. Mild improvement in PHQ9 . Also still feels weight of covid 19 so feels overall in a reasonable spot. Will consider this stable/almost fully controlled- continue current meds - continue [facility] with [facility] Santina [name] - not seeing Dr. Meda Coffee regularly -feels like concentration issues since starting new job- feels she may simply be overhwhelmed- we will check back in 4-6 months or sooner if needed  Recommended follow up: 4-6 month follow up  Future Appointments  Date Time Provider [place]  01/23/2020  3:30 PM Fontaine, Belinda Block, MD [facility]   Lab/Order associations:   ICD-10-CM   1. Bipolar II disorder ([place])  F31.81   2. Hypothyroidism, unspecified type  E03.9   3. Vitamin B12 deficiency  E53.8    Return precautions advised.  Garret Reddish, MD

## 2019-02-22 NOTE — Patient Instructions (Addendum)
start oral b12/cyanocobalamin 1044mcg daily  Continue current psychiatric meds- please let me know if symptoms fail to continue to improve or of they worsen- lets follow up in 4-6 months or sooner if you need [place].   Recommend flu shot and shingrix (shingles shot)- she is going to consider getting at her pharmacy so she can know direct cost before receiving. Can try goodrx

## 2019-02-27 ENCOUNTER — Encounter: Payer: Self-pay | Admitting: Gynecology

## 2019-03-23 ENCOUNTER — Encounter (INDEPENDENT_AMBULATORY_CARE_PROVIDER_SITE_OTHER): Payer: Self-pay

## 2019-04-11 DIAGNOSIS — H04123 Dry eye syndrome of bilateral lacrimal glands: Secondary | ICD-10-CM | POA: Diagnosis not present

## 2019-04-11 DIAGNOSIS — H353132 Nonexudative age-related macular degeneration, bilateral, intermediate dry stage: Secondary | ICD-10-CM | POA: Diagnosis not present

## 2019-04-11 DIAGNOSIS — H2513 Age-related nuclear cataract, bilateral: Secondary | ICD-10-CM | POA: Diagnosis not present

## 2019-04-19 ENCOUNTER — Other Ambulatory Visit: Payer: Self-pay | Admitting: Family Medicine

## 2019-04-20 DIAGNOSIS — L820 Inflamed seborrheic keratosis: Secondary | ICD-10-CM | POA: Diagnosis not present

## 2019-04-20 DIAGNOSIS — Z85828 Personal history of other malignant neoplasm of skin: Secondary | ICD-10-CM | POA: Diagnosis not present

## 2019-04-20 DIAGNOSIS — L819 Disorder of pigmentation, unspecified: Secondary | ICD-10-CM | POA: Diagnosis not present

## 2019-04-20 DIAGNOSIS — L570 Actinic keratosis: Secondary | ICD-10-CM | POA: Diagnosis not present

## 2019-04-20 DIAGNOSIS — D225 Melanocytic nevi of trunk: Secondary | ICD-10-CM | POA: Diagnosis not present

## 2019-04-20 DIAGNOSIS — L821 Other seborrheic keratosis: Secondary | ICD-10-CM | POA: Diagnosis not present

## 2019-04-24 ENCOUNTER — Other Ambulatory Visit: Payer: Self-pay

## 2019-04-24 ENCOUNTER — Ambulatory Visit (INDEPENDENT_AMBULATORY_CARE_PROVIDER_SITE_OTHER): Payer: BC Managed Care – PPO | Admitting: Family Medicine

## 2019-04-24 ENCOUNTER — Encounter: Payer: Self-pay | Admitting: Family Medicine

## 2019-04-24 VITALS — BP 105/66 | HR 66 | Temp 98.4°F | Ht 69.0 in | Wt 161.4 lb

## 2019-04-24 DIAGNOSIS — Z20828 Contact with and (suspected) exposure to other viral communicable diseases: Secondary | ICD-10-CM | POA: Diagnosis not present

## 2019-04-24 DIAGNOSIS — Z20822 Contact with and (suspected) exposure to covid-19: Secondary | ICD-10-CM

## 2019-04-24 DIAGNOSIS — J309 Allergic rhinitis, unspecified: Secondary | ICD-10-CM | POA: Diagnosis not present

## 2019-04-24 DIAGNOSIS — R5383 Other fatigue: Secondary | ICD-10-CM | POA: Diagnosis not present

## 2019-04-24 DIAGNOSIS — I951 Orthostatic hypotension: Secondary | ICD-10-CM

## 2019-04-24 LAB — CBC WITH DIFFERENTIAL/PLATELET
Basophils Absolute: 0 10*3/uL (ref 0.0–0.1)
Basophils Relative: 0.6 % (ref 0.0–3.0)
Eosinophils Absolute: 0.1 10*3/uL (ref 0.0–0.7)
Eosinophils Relative: 1.9 % (ref 0.0–5.0)
HCT: 38.8 % (ref 36.0–46.0)
Hemoglobin: 12.7 g/dL (ref 12.0–15.0)
Lymphocytes Relative: 23.5 % (ref 12.0–46.0)
Lymphs Abs: 1.8 10*3/uL (ref 0.7–4.0)
MCHC: 32.9 g/dL (ref 30.0–36.0)
MCV: 96.7 fl (ref 78.0–100.0)
Monocytes Absolute: 0.6 10*3/uL (ref 0.1–1.0)
Monocytes Relative: 7.7 % (ref 3.0–12.0)
Neutro Abs: 5 10*3/uL (ref 1.4–7.7)
Neutrophils Relative %: 66.3 % (ref 43.0–77.0)
Platelets: 244 10*3/uL (ref 150.0–400.0)
RBC: 4.01 Mil/uL (ref 3.87–5.11)
RDW: 13 % (ref 11.5–15.5)
WBC: 7.6 10*3/uL (ref 4.0–10.5)

## 2019-04-24 LAB — COMPREHENSIVE METABOLIC PANEL
ALT: 14 U/L (ref 0–35)
AST: 17 U/L (ref 0–37)
Albumin: 3.5 g/dL (ref 3.5–5.2)
Alkaline Phosphatase: 62 U/L (ref 39–117)
BUN: 16 mg/dL (ref 6–23)
CO2: 23 mEq/L (ref 19–32)
Calcium: 8.8 mg/dL (ref 8.4–10.5)
Chloride: 103 mEq/L (ref 96–112)
Creatinine, Ser: 0.71 mg/dL (ref 0.40–1.20)
GFR: 84 mL/min (ref >60.00)
Glucose, Bld: 86 mg/dL (ref 70–99)
Potassium: 4.9 mEq/L (ref 3.5–5.1)
Sodium: 132 mEq/L — ABNORMAL LOW (ref 135–145)
Total Bilirubin: 0.4 mg/dL (ref 0.2–1.2)
Total Protein: 6.3 g/dL (ref 6.0–8.3)

## 2019-04-24 LAB — TSH: TSH: 3.65 u[IU]/mL (ref 0.35–4.50)

## 2019-04-24 MED ORDER — FLUTICASONE PROPIONATE 50 MCG/ACT NA SUSP: 2.0000 | Freq: Every day | NASAL | 0 refills | Status: DC

## 2019-04-24 NOTE — Progress Notes (Signed)
Phone [phone] In person visit   Subjective:   Dorothy Lynch is a 58 y.o. year old very pleasant female patient who presents for/with See problem oriented charting Chief Complaint  Patient presents with   Dizziness    with lt ear fullness x 3 d   ROS- Review of Systems  Constitutional: Negative.   HENT: Positive for ear pain (Left ear discomfort/fluid build up).   Eyes: Negative.   Respiratory: Negative.   Cardiovascular: Negative.   Gastrointestinal: Positive for nausea (Started about 3 days ago).  Genitourinary: Negative.   Musculoskeletal: Negative.   Neurological: Positive for dizziness (vertigo sx) and headaches (mild headaches. started 3 days ago. Patient took Aleve).  Endo/Heme/Allergies: Negative.   Psychiatric/Behavioral: Positive for depression (mild depression).  To clarify-patient having dizziness and not vertigo  This visit occurred during the SARS-CoV-2 public health emergency.  Safety protocols were in place, including screening questions prior to the visit, additional usage of staff PPE, and extensive cleaning of exam room while observing appropriate contact time as indicated for disinfecting solutions.   Past Medical History-  Patient Active Problem List   Diagnosis Date Noted   Bipolar II disorder ([place]) 01/18/2011    Priority: High   Menopausal symptoms 10/14/2015    Priority: Medium   Hypothyroidism 10/14/2015    Priority: Medium   Insomnia 01/18/2011    Priority: Medium   Constipation 12/19/2017    Priority: Low   Allergic rhinitis 10/14/2015    Priority: Low   Yeast infection 10/14/2015    Priority: Low   Primary osteoarthritis of left hip 01/24/2015    Priority: Low   History of squamous cell carcinoma of skin     Priority: Low   Vitamin B12 deficiency 02/22/2019    Medications- reviewed and updated Current Outpatient Medications  Medication Sig Dispense Refill   BIOTIN PO Take 1 tablet by mouth daily.      Cholecalciferol  (VITAMIN D3) 2000 units TABS Take 1 tablet by mouth daily.     escitalopram (LEXAPRO) 10 MG tablet TAKE 1 TABLET BY MOUTH EVERY DAY 90 tablet 1   estradiol (ESTRACE) 1 MG tablet Take 2 tablets (2 mg total) by mouth 2 (two) times daily. 124 tablet 12   fexofenadine (ALLERGY RELIEF) 180 MG tablet TAKE 1 TABLET BY MOUTH EVERY DAY PRN 30 tablet 6   Glucosamine HCl (GLUCOSAMINE PO) Take 1 tablet by mouth daily.     lamoTRIgine (LAMICTAL) 100 MG tablet TAKE 1 TABLET BY MOUTH 2 TIMES DAILY 180 tablet 1   levothyroxine (SYNTHROID) 75 MCG tablet TAKE 1 TABLET BY MOUTH EVERY DAY BEFORE BREAKFAST 90 tablet 2   NONFORMULARY OR COMPOUNDED ITEM Boric acid suppository 600 mg once vaginally twice weekly 30 each 2   Omega-3 Fatty Acids (FISH OIL PO) Take 1 capsule by mouth daily.      Polyethyl Glycol-Propyl Glycol (SYSTANE OP) Place 1 drop into both eyes daily as needed (for dry eyes).     progesterone (PROMETRIUM) 100 MG capsule Take 1 capsule (100 mg total) by mouth at bedtime. 30 capsule 12   tretinoin (RETIN-A) 0.05 % cream Apply topically at bedtime. 45 g 1   venlafaxine XR (EFFEXOR XR) 75 MG 24 hr capsule Take 1 capsule (75 mg total) by mouth daily with breakfast. 90 capsule 3   zolpidem (AMBIEN) 10 MG tablet Take 1 tablet (10 mg total) by mouth at bedtime as needed. for sleep 30 tablet 2   fluticasone (FLONASE) 50 MCG/ACT nasal spray  Place 2 sprays into both nostrils daily. 16 g 0   No current facility-administered medications for this visit.      Objective:  BP 105/66 (BP Location: Left Arm, Patient Position: Sitting, Cuff Size: Normal)    Pulse 66    Temp 98.4 F (36.9 C) (Temporal)    Ht 5\' 9"  (1.753 m)    Wt 161 lb 6.4 oz (73.2 kg)    LMP 09/28/2008    SpO2 96%    BMI 23.83 kg/m  Gen: NAD, resting comfortably Tympanic membrane normal on the right, on the left slight bulge-possible fluid behind the membrane without air-fluid level though.  No increased erythema on this side.  Nasal  turbinates slightly edematous.  Pharynx with some cobblestoning CV: RRR no murmurs rubs or gallops Lungs: CTAB no crackles, wheeze, rhonchi Abdomen: soft/nontender/nondistended/normal bowel sounds.  Ext: no edema Skin: warm, dry     Assessment and Plan   # Fatigue/dizziness and also with left ear discomfort with Neti Pot use associated with fullness but no hearing loss S:Patient reports symptoms for the last 3 days.  States feels very tired for last 3 days- after work feels really tired. She has also noted dizziness- lightheaded with standing in particular- almost feels like she will fall over. Slightly better though yesterday but no further improvement today.  Feels like she drinks a decent amount of water-sounds like 8 to 10 ounces 4 times a day.  When using netipot for a few weeks with a pulling sensation in left ear when uses.   She was not sure she needed to be seen but significant other encouraged follow up.   She reports staying well hydrated. Still taking lexapro 10mg , effexor 75 mg, lamictal for bipolar. Still taking levothyroxine for tyroid with last tsh normal. Denies worsening of depression ver last 3 days but in general has felt somewhat down due to covid.  Lab Results  Component Value Date   TSH 2.71 12/11/2018   Sleeping ok at night with the [place].  A/P:  For fatigue-doubt COVID-19 but could be potential symptom-we will test today -Also get CBC, CMP, TSH.  Reports hair loss but has been ongoing issue -Self quarantine was discussed until negative test result though I do not think there is a high probability of her having COVID-19.  Of note I was then with patient for approximately 2 minutes before she complained of fatigue-at that point I left the room and put on an N95 mask-team members that entered after me also wore N95 mask.  Gave instructions if she has new or worsening symptoms to seek care-particular if oxygen levels dropped-see discussion on pulse oximeter on  AVS  For orthostatic intolerance-I wonder if dehydration could be playing a role in fatigue as well as lightheadedness with standing.  I encouraged her to increase her fluid intake by at least 20 ounces a day in target 60 ounces per day.  Can also drink some Gatorade.  Labs as above.  SSRI and SNRI could contribute to symptoms but she needs these with bipolar history.  For left ear discomfort with Nettie pot and left ear fullness sensation-slight bulge on left tympanic membrane-wonder about otitis media with effusion.  She does report intermittent allergy symptoms over the last few months-we trial Flonase for 3 to 4 weeks and if not improving may refer to ENT   Recommended follow up: Return for next already scheduled visit. Future Appointments  Date Time Provider [place]  08/22/2019  9:40 AM Yong Channel,  Brayton Mars, MD [facility]  01/23/2020  3:30 PM Fontaine, Belinda Block, MD [facility]   Lab/Order associations:   ICD-10-CM   1. Fatigue, unspecified type  R53.83 CBC with Differential/Platelet    Comprehensive metabolic panel    TSH    covid  2. Allergic rhinitis, unspecified seasonality, unspecified trigger  J30.9   3. Orthostatic hypotension  I95.1   4. Encounter for screening laboratory testing for COVID-19 virus  Z20.828 covid    CANCELED: covid   Meds ordered this encounter  Medications   fluticasone (FLONASE) 50 MCG/ACT nasal spray    Sig: Place 2 sprays into both nostrils daily.    Dispense:  16 g    Refill:  0    Return precautions advised.  Garret Reddish, MD

## 2019-04-24 NOTE — Patient Instructions (Addendum)
Drawing labs in the room to look for obvious cause of fatigue  With lightheadedness with standing-I do wonder if you may be suffering from some dehydration-recommend you increase water intake by at least 20 ounces per day.  You may want to even consider some electrolytes like Gatorade.  If you have new or worsening symptoms please let us know-your psychiatric medicines may contribute to orthostatic intolerance which simply means difficulty with position changes but I would not recommend a change at present.  Please get Covid testing today-if you have new or worsening symptoms please update [place].  If you can have a family member drop off a pulse oximeter-I would like for you to monitor your oxygen saturation and if it gets below 94% consistently please let us know immediately or seek care in the emergency room  Recommended follow up: Return for next already scheduled visit.  In March or sooner if you need [place] such as if fatigue worsens or new symptoms arise    Due to your symptoms we have placed order for Covid testing. You are required to self quarantine until results are received for not only your safety but the safety of people you may come in contact with. It is advised that if you have a way to check your oxygen level that you do so. If levels go below 95% and does not increase, you have increased cough or shortness of breath that you receive  care immediately.   We have put in an order for you to be tested for COVID-19.  You do not need an appointment to go get testing -- please proceed to one of the following drive-up testing locations at your convenience. The line closes at 3:00pm daily. Test results may take up to a week to return.  [facility] Location: [address], [place] (old [facility]) Hours: 10a-3p, Mon-Fri  [facility] Location: [address], [place], [place] [address] Entrance at [facility] Hours: 10a-3p,  Mon-Fri  [employer] [facility] Location:  [facility], dispensing (across from [facility] Emergency Department/[place]) Hours: 10a-3p, Tue-Fri --> CLOSED ALL DAY MONDAY

## 2019-06-05 DIAGNOSIS — M545 Low back pain: Secondary | ICD-10-CM | POA: Diagnosis not present

## 2019-06-14 ENCOUNTER — Other Ambulatory Visit: Payer: Self-pay | Admitting: Family Medicine

## 2019-07-02 ENCOUNTER — Other Ambulatory Visit: Payer: Self-pay | Admitting: *Deleted

## 2019-07-02 MED ORDER — ZOLPIDEM TARTRATE 10 MG PO TABS: 10.0000 mg | ORAL_TABLET | Freq: Every evening | ORAL | 2 refills | Status: DC | PRN

## 2019-07-02 NOTE — Telephone Encounter (Signed)
Per note on 01/22/19 "Insomnia.  Uses one half of an Ambien nightly.  Has done so for years with no side effects or sleep activities.  #30 with 2 refills provided."

## 2019-07-10 DIAGNOSIS — Z1231 Encounter for screening mammogram for malignant neoplasm of breast: Secondary | ICD-10-CM | POA: Diagnosis not present

## 2019-07-11 ENCOUNTER — Encounter: Payer: Self-pay | Admitting: Women's Health

## 2019-08-08 ENCOUNTER — Encounter: Payer: Self-pay | Admitting: Women's Health

## 2019-08-22 ENCOUNTER — Ambulatory Visit: Payer: BC Managed Care – PPO | Admitting: Family Medicine

## 2019-08-22 ENCOUNTER — Encounter: Payer: Self-pay | Admitting: Family Medicine

## 2019-08-22 ENCOUNTER — Other Ambulatory Visit: Payer: Self-pay

## 2019-08-22 VITALS — BP 108/68 | HR 58 | Temp 97.6°F | Ht 69.0 in | Wt 161.2 lb

## 2019-08-22 DIAGNOSIS — R5383 Other fatigue: Secondary | ICD-10-CM | POA: Diagnosis not present

## 2019-08-22 DIAGNOSIS — F3181 Bipolar II disorder: Secondary | ICD-10-CM | POA: Diagnosis not present

## 2019-08-22 DIAGNOSIS — E538 Deficiency of other specified B group vitamins: Secondary | ICD-10-CM

## 2019-08-22 DIAGNOSIS — E039 Hypothyroidism, unspecified: Secondary | ICD-10-CM

## 2019-08-22 LAB — TSH: TSH: 2.54 u[IU]/mL (ref 0.35–4.50)

## 2019-08-22 LAB — COMPREHENSIVE METABOLIC PANEL
ALT: 12 U/L (ref 0–35)
AST: 14 U/L (ref 0–37)
Albumin: 3.9 g/dL (ref 3.5–5.2)
Alkaline Phosphatase: 63 U/L (ref 39–117)
BUN: 16 mg/dL (ref 6–23)
CO2: 27 mEq/L (ref 19–32)
Calcium: 9.2 mg/dL (ref 8.4–10.5)
Chloride: 102 mEq/L (ref 96–112)
Creatinine, Ser: 0.72 mg/dL (ref 0.40–1.20)
GFR: 82.57 mL/min (ref >60.00)
Glucose, Bld: 88 mg/dL (ref 70–99)
Potassium: 4.8 mEq/L (ref 3.5–5.1)
Sodium: 135 mEq/L (ref 135–145)
Total Bilirubin: 0.4 mg/dL (ref 0.2–1.2)
Total Protein: 6.6 g/dL (ref 6.0–8.3)

## 2019-08-22 LAB — CBC WITH DIFFERENTIAL/PLATELET
Basophils Absolute: 0.1 10*3/uL (ref 0.0–0.1)
Basophils Relative: 0.8 % (ref 0.0–3.0)
Eosinophils Absolute: 0.2 10*3/uL (ref 0.0–0.7)
Eosinophils Relative: 2.4 % (ref 0.0–5.0)
HCT: 39.5 % (ref 36.0–46.0)
Hemoglobin: 13.3 g/dL (ref 12.0–15.0)
Lymphocytes Relative: 29 % (ref 12.0–46.0)
Lymphs Abs: 2 10*3/uL (ref 0.7–4.0)
MCHC: 33.6 g/dL (ref 30.0–36.0)
MCV: 97.9 fl (ref 78.0–100.0)
Monocytes Absolute: 0.6 10*3/uL (ref 0.1–1.0)
Monocytes Relative: 9.4 % (ref 3.0–12.0)
Neutro Abs: 4 10*3/uL (ref 1.4–7.7)
Neutrophils Relative %: 58.4 % (ref 43.0–77.0)
Platelets: 249 10*3/uL (ref 150.0–400.0)
RBC: 4.04 Mil/uL (ref 3.87–5.11)
RDW: 13.1 % (ref 11.5–15.5)
WBC: 6.8 10*3/uL (ref 4.0–10.5)

## 2019-08-22 LAB — T4, FREE: Free T4: 0.72 ng/dL (ref 0.60–1.60)

## 2019-08-22 LAB — VITAMIN B12: Vitamin B-12: 813 pg/mL (ref 211–911)

## 2019-08-22 LAB — T3, FREE: T3, Free: 2.8 pg/mL (ref 2.3–4.2)

## 2019-08-22 NOTE — Patient Instructions (Addendum)
Please stop by lab before you go If you do not have mychart- we will call you about results within 5 business days of [place] receiving them.  If you have mychart- we will send your results within 3 business days of [place] receiving them.  If abnormal or we want to clarify a result, we will call or mychart you to make sure you receive the message.  If you have questions or concerns or don't hear within 5 business days, please send [place] a message or call us.   Lets have you restart your regular exercise and get back in with psycholgist Randall Hiss and lets check back in 2-3 months to see how you are doing or sooner if you need [place]   Recommended follow up: Return in about 2 months (around 10/22/2019).

## 2019-08-22 NOTE — Progress Notes (Signed)
Phone [phone] In person visit   Subjective:   Dorothy Lynch is a 59 y.o. year old very pleasant female patient who presents for/with See problem oriented charting Chief Complaint  Patient presents with  . Fatigue    This visit occurred during the SARS-CoV-2 public health emergency.  Safety protocols were in place, including screening questions prior to the visit, additional usage of staff PPE, and extensive cleaning of exam room while observing appropriate contact time as indicated for disinfecting solutions.   Past Medical History-  Patient Active Problem List   Diagnosis Date Noted  . Bipolar II disorder ([place]) 01/18/2011    Priority: High  . Menopausal symptoms 10/14/2015    Priority: Medium  . Hypothyroidism 10/14/2015    Priority: Medium  . Insomnia 01/18/2011    Priority: Medium  . Constipation 12/19/2017    Priority: Low  . Allergic rhinitis 10/14/2015    Priority: Low  . Yeast infection 10/14/2015    Priority: Low  . Primary osteoarthritis of left hip 01/24/2015    Priority: Low  . History of squamous cell carcinoma of skin     Priority: Low  . Vitamin B12 deficiency 02/22/2019    Medications- reviewed and updated Current Outpatient Medications  Medication Sig Dispense Refill  . BIOTIN PO Take 1 tablet by mouth daily.     . Cholecalciferol (VITAMIN D3) 2000 units TABS Take 1 tablet by mouth daily.    Marland Kitchen escitalopram (LEXAPRO) 10 MG tablet TAKE 1 TABLET BY MOUTH EVERY DAY 90 tablet 1  . estradiol (ESTRACE) 1 MG tablet Take 2 tablets (2 mg total) by mouth 2 (two) times daily. 124 tablet 12  . fexofenadine (ALLERGY RELIEF) 180 MG tablet TAKE 1 TABLET BY MOUTH EVERY DAY PRN 30 tablet 6  . fluticasone (FLONASE) 50 MCG/ACT nasal spray Place 2 sprays into both nostrils daily. 16 g 0  . Glucosamine HCl (GLUCOSAMINE PO) Take 1 tablet by mouth daily.    Marland Kitchen lamoTRIgine (LAMICTAL) 100 MG tablet TAKE 1 TABLET BY MOUTH 2 TIMES DAILY 180 tablet 1  . levothyroxine (SYNTHROID)  75 MCG tablet TAKE 1 TABLET BY MOUTH EVERY DAY BEFORE BREAKFAST 90 tablet 2  . NONFORMULARY OR COMPOUNDED ITEM Boric acid suppository 600 mg once vaginally twice weekly 30 each 2  . Omega-3 Fatty Acids (FISH OIL PO) Take 1 capsule by mouth daily.     Vladimir Faster Glycol-Propyl Glycol (SYSTANE OP) Place 1 drop into both eyes daily as needed (for dry eyes).    . progesterone (PROMETRIUM) 100 MG capsule Take 1 capsule (100 mg total) by mouth at bedtime. 30 capsule 12  . venlafaxine XR (EFFEXOR XR) 75 MG 24 hr capsule Take 1 capsule (75 mg total) by mouth daily with breakfast. 90 capsule 3  . zolpidem (AMBIEN) 10 MG tablet Take 1 tablet (10 mg total) by mouth at bedtime as needed. for sleep 30 tablet 2   No current facility-administered medications for this visit.     Objective:  BP 108/68   Pulse (!) 58   Temp 97.6 F (36.4 C) (Temporal)   Ht 5\' 9"  (1.753 m)   Wt 161 lb 3.2 oz (73.1 kg)   LMP 09/28/2008   SpO2 98%   BMI 23.81 kg/m  Gen: NAD, resting comfortably CV: RRR no murmurs rubs or gallops Lungs: CTAB no crackles, wheeze, rhonchi Ext: no edema Skin: warm, dry     Assessment and Plan   # Bipolar II Disorder S:Taking Escitalopram 10 mg daily,  Lamictal 100 mg BID, and venlafaxine XR 75 mg daily  Continues to have episodes of fatigue that is severe several times a month. No chest pain or shortness of breath. working and job is going ok. She still has days where she is really tired which she describes as sinking spells perhaps a few days a month where she has no energy- perhaps 1 day a month where it is hard to go to work. Has been going on for sometime perhaps a few years. She has not been exercising lately. Feels liek that is contributing.  A/P: PHQ-9 is elevated at 10.  No suicidal ideation.  Patient wonders if symptoms are related to bipolar-I recommended psychiatry follow-up.  Patient states she had a very poor experience in the past where she ended up hospitalized after changes  in medication and she prefers to work on increasing her exercise and going back to counseling.  She will let me know if she is not improving or if she has worsening depression or any suicidal thoughts.  # B12 deficiency S: Patient has been taking oral supplement 3-4 times a weak. Has had increased fatigue. Originally did weekly injections for a month Lab Results  Component Value Date   [id number] 08/22/2019  A/P: B12 is well controlled today-this does not appear to be the cause of her symptoms  # Insomnia S:Taking Zolpidem 10 mg qhs prn.  A/P: This does not work perfectly but she does get a reasonable amount of sleep-I do not think sleep is the primary cause of her fatigue  #hypothyroidism S: compliant On thyroid medication-  Levothyroxine 75 mcg Lab Results  Component Value Date   TSH 2.54 08/22/2019   A/P: Doing well labs today-continue current medication  Recommended follow up: Return in about 2 months (around 10/22/2019).  Or certainly sooner if needed Future Appointments  Date Time Provider [place]  01/23/2020  3:30 PM Joseph Pierini, MD [facility]    Lab/Order associations:   ICD-10-CM   1. Hypothyroidism, unspecified type  E03.9 Vitamin B12    T3, free    T4, free  2. Vitamin B12 deficiency  E53.8 TSH  3. Fatigue, unspecified type  R53.83 CBC with Differential/Platelet    Comprehensive metabolic panel  4. Bipolar II disorder (HCC) Chronic F31.81     Time Spent: 34 minutes of total time (10:28 PM-10:58 PM. 9:20 PM-9:24 PM ) was spent on the date of the encounter performing the following actions: chart review prior to seeing the patient, obtaining history, performing a medically necessary exam, counseling on the treatment plan, placing orders, and documenting in our EHR.   Return precautions advised.  Garret Reddish, MD

## 2019-08-27 NOTE — Telephone Encounter (Signed)
Patient has called back.  I have given her Dr. Ronney Lion response to her lab work.  Patient would like to know if there is anything else that could be tested for low energy level?

## 2019-09-24 ENCOUNTER — Other Ambulatory Visit: Payer: Self-pay | Admitting: Obstetrics and Gynecology

## 2019-10-10 DIAGNOSIS — Z85828 Personal history of other malignant neoplasm of skin: Secondary | ICD-10-CM | POA: Diagnosis not present

## 2019-10-10 DIAGNOSIS — L239 Allergic contact dermatitis, unspecified cause: Secondary | ICD-10-CM | POA: Diagnosis not present

## 2019-10-10 DIAGNOSIS — L570 Actinic keratosis: Secondary | ICD-10-CM | POA: Diagnosis not present

## 2019-10-10 DIAGNOSIS — I788 Other diseases of capillaries: Secondary | ICD-10-CM | POA: Diagnosis not present

## 2019-11-21 ENCOUNTER — Other Ambulatory Visit: Payer: Self-pay | Admitting: Obstetrics and Gynecology

## 2019-11-21 NOTE — Telephone Encounter (Signed)
I called pharmacy to confirm patient needs refill, it does appear patient is taking Ambien 10 mg tablet daily. Pick up dates for refills provided by pharmacy 09/24/19, 10/22/19,11/21/19. This refill will be for the future.

## 2019-12-12 DIAGNOSIS — H353132 Nonexudative age-related macular degeneration, bilateral, intermediate dry stage: Secondary | ICD-10-CM | POA: Diagnosis not present

## 2019-12-12 DIAGNOSIS — H04123 Dry eye syndrome of bilateral lacrimal glands: Secondary | ICD-10-CM | POA: Diagnosis not present

## 2019-12-12 DIAGNOSIS — H2513 Age-related nuclear cataract, bilateral: Secondary | ICD-10-CM | POA: Diagnosis not present

## 2019-12-18 ENCOUNTER — Other Ambulatory Visit: Payer: Self-pay | Admitting: Family Medicine

## 2020-01-14 ENCOUNTER — Other Ambulatory Visit: Payer: Self-pay | Admitting: *Deleted

## 2020-01-14 MED ORDER — LEVOTHYROXINE SODIUM 75 MCG PO TABS: ORAL_TABLET | ORAL | 0 refills | Status: DC

## 2020-01-23 ENCOUNTER — Encounter: Payer: Self-pay | Admitting: Obstetrics and Gynecology

## 2020-01-23 ENCOUNTER — Ambulatory Visit: Payer: BC Managed Care – PPO | Admitting: Obstetrics and Gynecology

## 2020-01-23 ENCOUNTER — Other Ambulatory Visit: Payer: Self-pay

## 2020-01-23 VITALS — BP 120/76 | Ht 68.5 in | Wt 156.0 lb

## 2020-01-28 DIAGNOSIS — M545 Low back pain: Secondary | ICD-10-CM | POA: Diagnosis not present

## 2020-01-29 NOTE — Progress Notes (Unsigned)
Pt not seen by provider.

## 2020-01-31 ENCOUNTER — Other Ambulatory Visit: Payer: Self-pay

## 2020-01-31 NOTE — Telephone Encounter (Signed)
Patient has her CE scheduled with Dr. Delilah Shan for 02/15/20.

## 2020-02-05 ENCOUNTER — Other Ambulatory Visit: Payer: Self-pay | Admitting: Family Medicine

## 2020-02-06 ENCOUNTER — Other Ambulatory Visit: Payer: Self-pay

## 2020-02-06 DIAGNOSIS — N951 Menopausal and female climacteric states: Secondary | ICD-10-CM

## 2020-02-06 NOTE — Telephone Encounter (Signed)
CE scheduled 02/15/20 with Dr. Delilah Shan.

## 2020-02-15 ENCOUNTER — Other Ambulatory Visit: Payer: Self-pay

## 2020-02-15 ENCOUNTER — Encounter: Payer: Self-pay | Admitting: Obstetrics and Gynecology

## 2020-02-15 ENCOUNTER — Ambulatory Visit (INDEPENDENT_AMBULATORY_CARE_PROVIDER_SITE_OTHER): Payer: BC Managed Care – PPO | Admitting: Obstetrics and Gynecology

## 2020-02-15 ENCOUNTER — Encounter: Payer: BC Managed Care – PPO | Admitting: Obstetrics and Gynecology

## 2020-02-15 VITALS — BP 120/76 | Ht 68.5 in | Wt 156.0 lb

## 2020-02-15 DIAGNOSIS — Z79890 Hormone replacement therapy (postmenopausal): Secondary | ICD-10-CM

## 2020-02-15 DIAGNOSIS — N952 Postmenopausal atrophic vaginitis: Secondary | ICD-10-CM | POA: Diagnosis not present

## 2020-02-15 DIAGNOSIS — Z13820 Encounter for screening for osteoporosis: Secondary | ICD-10-CM | POA: Diagnosis not present

## 2020-02-15 DIAGNOSIS — Z01419 Encounter for gynecological examination (general) (routine) without abnormal findings: Secondary | ICD-10-CM | POA: Diagnosis not present

## 2020-02-15 DIAGNOSIS — N951 Menopausal and female climacteric states: Secondary | ICD-10-CM | POA: Diagnosis not present

## 2020-02-15 MED ORDER — ESTRADIOL 1 MG PO TABS
2.0000 mg | ORAL_TABLET | Freq: Two times a day (BID) | ORAL | 12 refills | Status: DC
Start: 2020-02-15 — End: 2020-02-18

## 2020-02-15 MED ORDER — ZOLPIDEM TARTRATE 10 MG PO TABS
10.0000 mg | ORAL_TABLET | Freq: Every evening | ORAL | 3 refills | Status: DC | PRN
Start: 2020-02-15 — End: 2020-08-06

## 2020-02-15 MED ORDER — PROGESTERONE MICRONIZED 100 MG PO CAPS: 100.0000 mg | ORAL_CAPSULE | Freq: Every day | ORAL | 12 refills | Status: DC

## 2020-02-15 NOTE — Progress Notes (Signed)
Dorothy Lynch [birth year] [id number]  SUBJECTIVE:  59 y.o. [id number] female for annual routine gynecologic exam. Has noticed a very slight intermittent pink discharge in the past 2 weeks.  No vaginal bleeding.  Continues to take estradiol total of 4 mg daily for HRT along with the Prometrium at bedtime.  She has no other gynecologic concerns.    Current Outpatient Medications  Medication Sig Dispense Refill  . BIOTIN PO Take 1 tablet by mouth daily.     . Cholecalciferol (VITAMIN D3) 2000 units TABS Take 1 tablet by mouth daily.    Marland Kitchen escitalopram (LEXAPRO) 10 MG tablet TAKE 1 TABLET BY MOUTH EVERY DAY 90 tablet 1  . estradiol (ESTRACE) 1 MG tablet Take 2 tablets (2 mg total) by mouth 2 (two) times daily. 124 tablet 12  . fexofenadine (ALLERGY RELIEF) 180 MG tablet TAKE 1 TABLET BY MOUTH EVERY DAY PRN 30 tablet 6  . Glucosamine HCl (GLUCOSAMINE PO) Take 1 tablet by mouth daily.    Marland Kitchen lamoTRIgine (LAMICTAL) 100 MG tablet TAKE 1 TABLET BY MOUTH 2 TIMES DAILY 180 tablet 1  . levothyroxine (SYNTHROID) 75 MCG tablet TAKE 1 TABLET BY MOUTH EVERY DAY BEFORE BREAKFAST 90 tablet 0  . NONFORMULARY OR COMPOUNDED ITEM Boric acid suppository 600 mg once vaginally twice weekly 30 each 2  . Omega-3 Fatty Acids (FISH OIL PO) Take 1 capsule by mouth daily.     Vladimir Faster Glycol-Propyl Glycol (SYSTANE OP) Place 1 drop into both eyes daily as needed (for dry eyes).    . progesterone (PROMETRIUM) 100 MG capsule Take 1 capsule (100 mg total) by mouth at bedtime. 30 capsule 12  . venlafaxine XR (EFFEXOR-XR) 75 MG 24 hr capsule TAKE 1 CAPSULE BY MOUTH EVERY DAY WITH BREAKFAST 90 capsule 0  . zolpidem (AMBIEN) 10 MG tablet TAKE 1 TABLET BY MOUTH AT BEDTIME AS NEEDED FOR SLEEP 30 tablet 2  . fluticasone (FLONASE) 50 MCG/ACT nasal spray Place 2 sprays into both nostrils daily. (Patient not taking: Reported on 01/23/2020) 16 g 0   No current facility-administered medications for this visit.   Allergies: Penicillins and  Sulfa antibiotics  Patient's last menstrual period was 09/28/2008.  Past medical history,surgical history, problem list, medications, allergies, family history and social history were all reviewed and documented as reviewed in the EPIC chart.  ROS:  Feeling well. No dyspnea or chest pain on exertion.  No abdominal pain, change in bowel habits, black or bloody stools.  No urinary tract symptoms. GYN ROS: no abnormal bleeding, pelvic pain or discharge, no breast pain or new or enlarging lumps on self exam.No neurological complaints.    OBJECTIVE:  BP 120/76   Ht 5' 8.5" (1.74 m)   Wt 156 lb (70.8 kg)   LMP 09/28/2008   BMI 23.37 kg/m  The patient appears well, alert, oriented x 3, in no distress. Abdomen soft without tenderness, guarding, mass or organomegaly.  Neurological is normal, no focal findings.  BREAST EXAM: breasts appear normal, no suspicious masses, no skin or nipple changes or axillary nodes  PELVIC EXAM: VULVA: normal appearing vulva with no masses, tenderness or lesions, VAGINA: normal appearing vagina with normal color and discharge, no evidence of vaginal bleeding/pink discharge, no lesions, CERVIX: normal appearing cervix without discharge or lesions, UTERUS: uterus is normal size, shape, consistency and nontender, ADNEXA: normal adnexa in size, nontender and no masses  Chaperone: Caryn Bee present during the examination  ASSESSMENT:  59 y.o. [id number] here for annual gynecologic exam  PLAN:   1. Postmenopausal/HRT.  She continues on estradiol 2 mg twice daily to control menopausal symptoms.  She is taking Prometrium 100 mg nightly.  No bleeding but is having a intermittent pink discharge in just the past few weeks.  She understands that this is a high dose of estrogen and puts her at higher risk of thrombotic diseases such as heart attack, stroke, DVT, PE and also there is a dose dependent concern for the breast cancer issue and also endometrial cancer despite uterine  protection with the progestin.  She understands this and having tried to decrease her dose by 1 mg daily she has had worsening hot flashes and still has hot flashes even at her 4 mg dose.  She also is already on Effexor.  Understanding the risks and her increased risk with a high dose she wants to continue so I did provide her with a refill x1 year.  In regards to the pink-tinged discharge I instructed her on several occasions today to let me know if it persists or if there is any development of bleeding whatsoever she needs to call for evaluation and we would want to repeat an ultrasound at that time. 2. Pap smear 12/2018.  Pap smear ASCUS with negative high-risk HPV x2 in 2018 and 2019.  Colposcopy was adequate and normal.  Next Pap smear due 2023 following the current guidelines recommending the 3 year interval. 3. Mammogram 07/2019.  Normal breast exam today.  To annual with annual mammograms. 4. Colonoscopy 2011.  She will plan on calling to schedule another colonoscopy. 5. DEXA never.  We did not get to discuss this today but it would be a good idea to do this in the next 3 years for baseline study. 6. Health maintenance.  No labs today as she normally has these completed with her primary care doctor.  Return annually or sooner, prn.  Joseph Pierini MD 02/15/20

## 2020-02-18 ENCOUNTER — Other Ambulatory Visit: Payer: Self-pay | Admitting: *Deleted

## 2020-02-18 MED ORDER — ESTRADIOL 1 MG PO TABS: 2.0000 mg | ORAL_TABLET | Freq: Two times a day (BID) | ORAL | 11 refills | Status: DC

## 2020-02-25 ENCOUNTER — Other Ambulatory Visit: Payer: Self-pay

## 2020-02-25 ENCOUNTER — Telehealth: Payer: Self-pay | Admitting: Family Medicine

## 2020-02-25 DIAGNOSIS — Z1211 Encounter for screening for malignant neoplasm of colon: Secondary | ICD-10-CM

## 2020-02-25 NOTE — Telephone Encounter (Signed)
Referral placed. I have attempted to lvm to make pt aware, I could not lvm.

## 2020-02-25 NOTE — Telephone Encounter (Signed)
Patient called in and stated she is due for her colonoscopy and wanted to see if she could have the referral placed.

## 2020-02-28 DIAGNOSIS — Z1159 Encounter for screening for other viral diseases: Secondary | ICD-10-CM | POA: Diagnosis not present

## 2020-03-05 ENCOUNTER — Other Ambulatory Visit: Payer: Self-pay | Admitting: *Deleted

## 2020-03-05 MED ORDER — FEXOFENADINE HCL 180 MG PO TABS
ORAL_TABLET | ORAL | 6 refills | Status: DC
Start: 2020-03-05 — End: 2021-03-26

## 2020-03-05 NOTE — Telephone Encounter (Signed)
Last filled on 09/11/18 with 6 refills

## 2020-04-01 ENCOUNTER — Other Ambulatory Visit: Payer: Self-pay | Admitting: Family Medicine

## 2020-04-09 DIAGNOSIS — M545 Low back pain, unspecified: Secondary | ICD-10-CM | POA: Diagnosis not present

## 2020-06-09 ENCOUNTER — Encounter: Payer: Self-pay | Admitting: Family Medicine

## 2020-06-09 ENCOUNTER — Other Ambulatory Visit: Payer: Self-pay

## 2020-06-09 ENCOUNTER — Ambulatory Visit (INDEPENDENT_AMBULATORY_CARE_PROVIDER_SITE_OTHER): Payer: 59 | Admitting: Family Medicine

## 2020-06-09 VITALS — BP 102/62 | HR 66 | Temp 98.3°F | Ht 68.5 in | Wt 159.8 lb

## 2020-06-09 DIAGNOSIS — J029 Acute pharyngitis, unspecified: Secondary | ICD-10-CM

## 2020-06-09 DIAGNOSIS — J351 Hypertrophy of tonsils: Secondary | ICD-10-CM | POA: Diagnosis not present

## 2020-06-09 LAB — POCT RAPID STREP A (OFFICE): Rapid Strep A Screen: NEGATIVE

## 2020-06-09 MED ORDER — PREDNISONE 50 MG PO TABS: ORAL_TABLET | ORAL | 0 refills | Status: DC

## 2020-06-09 NOTE — Progress Notes (Signed)
b  Dorothy Lynch is a 60 y.o. female who presents today for an office visit.  Assessment/Plan:  Sore Throat Rapid strep negative.  No lip swelling or other signs of anaphylaxis.  Reassuring that symptoms have improved some today likely due to possible allergen exposure versus URI.  Will defer mono testing today as it would not significantly change management.  Given degree of discomfort we will start prednisone burst.  Discussed reasons to return to care.    Subjective:  HPI:  She is here with sore throat for the past day.  She had COVID 2 weeks ago.  Symptoms have essentially resolved.  She still has ongoing fatigue.  Yesterday woke up with sore throat and feeling like her uvula was swollen.  No fevers or chills.  Tried using salt water gargle with no improvement.  She recently started glucosamine/chondroitin.  She was also in about cabin recently for her birthday.  No lip swelling.  No difficulty breathing.  She has had some pain with swallowing.  No known sick contacts.  Symptoms are little bit better today than yesterday.      Objective:  Physical Exam: BP 102/62   Pulse 66   Temp 98.3 F (36.8 C) (Temporal)   Ht 5' 8.5" (1.74 m)   Wt 159 lb 12.8 oz (72.5 kg)   LMP 09/28/2008   SpO2 98%   BMI 23.94 kg/m   Gen: No acute distress, resting comfortably HEENT: TMs with clear effusion.  Oropharynx mildly erythematous.  Slight uvula edema noted.  No tonsillar hypertrophy. CV: Regular rate and rhythm with no murmurs appreciated Pulm: Normal work of breathing, clear to auscultation bilaterally with no crackles, wheezes, or rhonchi Neuro: Grossly normal, moves all extremities Psych: Normal affect and thought content      Lakaisha Danish M. Jerline Pain, MD 06/09/2020 9:47 AM

## 2020-06-09 NOTE — Patient Instructions (Signed)
It was very nice to see you today!  Your strep test is negative.  Please start the prednisone.  Let me know if not improving by next week and we can check blood work.  Take care, Dr Jerline Pain  Please try these tips to maintain a healthy lifestyle:   Eat at least 3 REAL meals and 1-2 snacks per day.  Aim for no more than 5 hours between eating.  If you eat breakfast, please do so within one hour of getting up.    Each meal should contain half fruits/vegetables, one quarter protein, and one quarter carbs (no bigger than a computer mouse)   Cut down on sweet beverages. This includes juice, soda, and sweet tea.     Drink at least 1 glass of water with each meal and aim for at least 8 glasses per day   Exercise at least 150 minutes every week.

## 2020-06-11 ENCOUNTER — Encounter: Payer: Self-pay | Admitting: Family Medicine

## 2020-06-11 ENCOUNTER — Telehealth: Payer: Self-pay

## 2020-06-11 NOTE — Telephone Encounter (Signed)
See below

## 2020-06-11 NOTE — Telephone Encounter (Signed)
Called and lm for pt tcb. Please relay below when pt calls back.

## 2020-06-11 NOTE — Telephone Encounter (Signed)
As Dr. Jerline Pain noted unlikely to significantly change management.  But if she would really like this done I am okay with the Monospot test being done.  Please confirm since she has sore throat that she has had a negative COVID-19 test by PCR this week

## 2020-06-11 NOTE — Telephone Encounter (Signed)
Patient is calling in saying stating she is still not feeling better, after doing a visit with Dr.Pakrer. Wants to know if she can be tested for Mono.

## 2020-06-11 NOTE — Telephone Encounter (Signed)
Waiting on patient to call our office back. Keba lvm.

## 2020-06-11 NOTE — Telephone Encounter (Signed)
Monospot will be done under sore throat

## 2020-06-12 ENCOUNTER — Other Ambulatory Visit: Payer: 59

## 2020-06-12 DIAGNOSIS — Z20822 Contact with and (suspected) exposure to covid-19: Secondary | ICD-10-CM

## 2020-06-12 NOTE — Telephone Encounter (Signed)
Patient called in message below, states she is still having a bad sore throat for 5 days now. Stated she has not had a recent covid test because she had COVID back in December.

## 2020-06-12 NOTE — Telephone Encounter (Signed)
Patient is scheduled for PCR tonight, but wants to know if she can be swabbed for Mono today or tomorrow.

## 2020-06-12 NOTE — Telephone Encounter (Signed)
Patient is aware or your comments, gave a verbal understanding.

## 2020-06-12 NOTE — Telephone Encounter (Signed)
The clinic is not set up for mono testing.  She will need to get a negative COVID test first and then come back for mono test once this is negative

## 2020-06-12 NOTE — Telephone Encounter (Signed)
She will be getting her Covid test tonight, Is it okay for [place] to swab her for [facility] tonight or does she have to wait for her COVID testing to come back ?

## 2020-06-12 NOTE — Telephone Encounter (Signed)
Review below 

## 2020-06-12 NOTE — Telephone Encounter (Signed)
See below from [facility].  Basically is spread by saliva-should avoid kissing daughter and should wash hands regularly.  I would recommend she get a negative COVID test before visiting with her daughter though.  Transmission EBV is the most common cause of infectious mononucleosis, but other viruses can cause this disease. Typically, these viruses spread most commonly through bodily fluids, especially saliva. However, these viruses can also spread through blood and semen during sexual contact, blood transfusions, and organ transplantations.

## 2020-06-12 NOTE — Telephone Encounter (Signed)
She is aware of your comments but she wants to know can her daughter still come see her even if she hasn't been tested for [facility] that's what she's more concerned about. She wants to know is she considered contagious and what does she need to do to take precautions.

## 2020-06-15 LAB — NOVEL CORONAVIRUS, NAA: SARS-CoV-2, NAA: NOT DETECTED

## 2020-06-16 ENCOUNTER — Other Ambulatory Visit: Payer: Self-pay | Admitting: Family Medicine

## 2020-06-16 ENCOUNTER — Encounter: Payer: BC Managed Care – PPO | Admitting: Gastroenterology

## 2020-06-17 ENCOUNTER — Other Ambulatory Visit: Payer: Self-pay

## 2020-06-17 ENCOUNTER — Encounter: Payer: Self-pay | Admitting: Family Medicine

## 2020-06-17 ENCOUNTER — Ambulatory Visit (INDEPENDENT_AMBULATORY_CARE_PROVIDER_SITE_OTHER): Payer: 59 | Admitting: Family Medicine

## 2020-06-17 VITALS — BP 116/74 | HR 68 | Temp 98.2°F | Wt 159.0 lb

## 2020-06-17 DIAGNOSIS — J351 Hypertrophy of tonsils: Secondary | ICD-10-CM | POA: Diagnosis not present

## 2020-06-17 DIAGNOSIS — J029 Acute pharyngitis, unspecified: Secondary | ICD-10-CM | POA: Diagnosis not present

## 2020-06-17 DIAGNOSIS — R599 Enlarged lymph nodes, unspecified: Secondary | ICD-10-CM | POA: Diagnosis not present

## 2020-06-17 LAB — POCT MONO (EPSTEIN BARR VIRUS): Mono, POC: NEGATIVE

## 2020-06-17 NOTE — Patient Instructions (Addendum)
Health Maintenance Due  Topic Date Due  . COVID-19 Vaccine (1)- August 29 2019 and J+J Never done  . COLONOSCOPY (Pts 45-82yrs Insurance coverage will need to be confirmed) - call me when insurance gets activated and we will schedule this or call yourself 02/20/2020   Recommended follow up:

## 2020-06-17 NOTE — Progress Notes (Signed)
Phone [phone] In person visit   Subjective:   Dorothy Lynch is a 60 y.o. year old very pleasant female patient who presents for/with See problem oriented charting Chief Complaint  Patient presents with  . Sore Throat  . Fatigue    All symptoms started on 12/26, recent negative COVID test, is requesting mono test be performed today   This visit occurred during the SARS-CoV-2 public health emergency.  Safety protocols were in place, including screening questions prior to the visit, additional usage of staff PPE, and extensive cleaning of exam room while observing appropriate contact time as indicated for disinfecting solutions.   Past Medical History-  Patient Active Problem List   Diagnosis Date Noted  . Bipolar II disorder ([place]) 01/18/2011    Priority: High  . Menopausal symptoms 10/14/2015    Priority: Medium  . Hypothyroidism 10/14/2015    Priority: Medium  . Insomnia 01/18/2011    Priority: Medium  . Constipation 12/19/2017    Priority: Low  . Allergic rhinitis 10/14/2015    Priority: Low  . Yeast infection 10/14/2015    Priority: Low  . Primary osteoarthritis of left hip 01/24/2015    Priority: Low  . History of squamous cell carcinoma of skin     Priority: Low  . Vitamin B12 deficiency 02/22/2019    Medications- reviewed and updated Current Outpatient Medications  Medication Sig Dispense Refill  . BIOTIN PO Take 1 tablet by mouth daily.     . Cholecalciferol (VITAMIN D3) 2000 units TABS Take 1 tablet by mouth daily.    Marland Kitchen escitalopram (LEXAPRO) 10 MG tablet TAKE 1 TABLET BY MOUTH EVERY DAY 30 tablet 1  . estradiol (ESTRACE) 1 MG tablet Take 2 tablets (2 mg total) by mouth 2 (two) times daily. 124 tablet 11  . fexofenadine (ALLERGY RELIEF) 180 MG tablet TAKE 1 TABLET BY MOUTH EVERY DAY PRN 30 tablet 6  . Glucosamine HCl (GLUCOSAMINE PO) Take 1 tablet by mouth daily.    Marland Kitchen lamoTRIgine (LAMICTAL) 100 MG tablet TAKE 1 TABLET BY MOUTH 2 TIMES DAILY 60 tablet 1  .  levothyroxine (SYNTHROID) 75 MCG tablet TAKE 1 TABLET BY MOUTH EVERY DAY BEFORE BREAKFAST 90 tablet 0  . NONFORMULARY OR COMPOUNDED ITEM Boric acid suppository 600 mg once vaginally twice weekly 30 each 2  . Omega-3 Fatty Acids (FISH OIL PO) Take 1 capsule by mouth daily.     Vladimir Faster Glycol-Propyl Glycol (SYSTANE OP) Place 1 drop into both eyes daily as needed (for dry eyes).    . progesterone (PROMETRIUM) 100 MG capsule Take 1 capsule (100 mg total) by mouth at bedtime. 30 capsule 12  . progesterone (PROMETRIUM) 100 MG capsule Take 1 capsule (100 mg total) by mouth daily. 30 capsule 12  . venlafaxine XR (EFFEXOR-XR) 75 MG 24 hr capsule TAKE 1 CAPSULE BY MOUTH EVERY DAY WITH BREAKFAST NEED APPOINTMENT 90 capsule 0  . zolpidem (AMBIEN) 10 MG tablet Take 1 tablet (10 mg total) by mouth at bedtime as needed. for sleep 30 tablet 3  . predniSONE (DELTASONE) 50 MG tablet Take 1 tablet daily for 5 days. (Patient not taking: Reported on 06/17/2020) 5 tablet 0   No current facility-administered medications for this visit.     Objective:  BP 116/74   Pulse 68   Temp 98.2 F (36.8 C) (Temporal)   Wt 159 lb (72.1 kg)   LMP 09/28/2008   SpO2 97%   BMI 23.82 kg/m  Gen: NAD, resting comfortably Mild erythema  in pharynx, uvula appears largely normal- per patient slightly large, tympanic membrane's normal bilaterally- possible effusion on the left Cervical lymphadenopathy noted on the right- 1 lymph node under 1 cm CV: RRR no murmurs rubs or gallops Lungs: CTAB no crackles, wheeze, rhonchi Ext: no edema Skin: warm, dry     Assessment and Plan   # sore throat/fatigue/swollen uvula S: patient started with symptoms 12/26. Tested positive for covid on 27th or 28th. She had bodyaches, bad headache, sore throat, exhaustion. Her boyfriend had bad cold. Headaches and bodyaches and sore throat improved but exhaustion persisted. At a later date noted some lymph node enlargement. On January 7th and 8th  started to feel better but on the 9th woke up and uvula was enlarged and had sore thraot. Trouble speaking due to pain and felt exhausted again. Then retested for covid 19 on 06/12/20 and was negative. Rapid strep negative.  Dr. Jerline Pain rxed prednisone and she had a lot of energy on this but sore throat did not improve signficantly- uvula has improved in size down at least 50%- bet day yet. Spurts of energy where she can get things done but then feels exhausted and does not feel like a period of depression. No new meds or foods she has been exposed to.   Starts back with [employer] rrow.  A/P: 60 year old female with history of COVID-19 in late December that seem to be improving then had some recurrence of symptoms primarily exhaustion as well as uvula swelling and sore throat and cervical lymphadenopathy within the last 10 days-these seem to be improving with prednisone.  Her Monospot test is negative today.  Her strep test was negative previously as well as repeat COVID-19 testing.  She may have simply had a viral illness after COVID-19 in addition to possible mild long COVID symptoms-I want her to continue to monitor her symptoms and return to see [place] if symptoms fail to improve/resolve.  Lymph node should go back to normal by the end of this month/early next month and if not I would like to reevaluate.  If worsening symptoms we can also send her to ear nose and throat for their expert opinion.  I suspect lingering fatigue is from either COVID itself or COVID and then a secondary viral infection  -we also discussed technically she can get covid booster at this point- since she had omicron most likely- likely has protection against current most infectious strain- probably for up to 6 months at least from severe disease  Recommended follow up: as needed if symptoms fail to improve  Lab/Order associations:   ICD-10-CM   1. Swelling of tonsil  J35.1   2. Sore throat  J02.9 POCT Mono (Epstein Barr  Virus)  3. Swollen lymph nodes  R59.9 POCT Mono (Epstein Barr Virus)   Time Spent: 23 minutes of total time (3:08 PM- 3:31 PM) was spent on the date of the encounter performing the following actions: chart review prior to seeing the patient, obtaining history, performing a medically necessary exam, counseling on the treatment plan, placing orders, and documenting in our EHR.   Return precautions advised.  Garret Reddish, MD

## 2020-07-02 ENCOUNTER — Other Ambulatory Visit: Payer: Self-pay | Admitting: Family Medicine

## 2020-08-04 ENCOUNTER — Other Ambulatory Visit: Payer: Self-pay | Admitting: Obstetrics and Gynecology

## 2020-08-04 ENCOUNTER — Other Ambulatory Visit: Payer: Self-pay | Admitting: Family Medicine

## 2020-08-25 ENCOUNTER — Other Ambulatory Visit: Payer: Self-pay | Admitting: Family Medicine

## 2020-09-01 ENCOUNTER — Telehealth: Payer: Self-pay

## 2020-09-01 ENCOUNTER — Other Ambulatory Visit: Payer: Self-pay | Admitting: Family Medicine

## 2020-09-01 NOTE — Telephone Encounter (Signed)
There are no visits available and patient needs this soon. Please advise

## 2020-09-01 NOTE — Telephone Encounter (Signed)
Please schedule virtual with pt to discuss this with Dr. Yong Channel.

## 2020-09-01 NOTE — Telephone Encounter (Signed)
Pt is getting married soon. She is experiencing extreme anxiety and " bouncing off the walls". She wants to know if Dr. Yong Channel could give her something to help her calm down the 5 days her family is in town for the wedding.

## 2020-09-02 ENCOUNTER — Telehealth: Payer: Self-pay

## 2020-09-02 NOTE — Telephone Encounter (Signed)
Pt is scheduled °

## 2020-09-02 NOTE — Telephone Encounter (Signed)
An SDA can be used.

## 2020-09-02 NOTE — Telephone Encounter (Signed)
Please advise. Pt is following up on this

## 2020-09-02 NOTE — Telephone Encounter (Signed)
err

## 2020-09-03 ENCOUNTER — Telehealth (INDEPENDENT_AMBULATORY_CARE_PROVIDER_SITE_OTHER): Payer: 59 | Admitting: Family Medicine

## 2020-09-03 ENCOUNTER — Encounter: Payer: Self-pay | Admitting: Family Medicine

## 2020-09-03 DIAGNOSIS — F3181 Bipolar II disorder: Secondary | ICD-10-CM

## 2020-09-03 DIAGNOSIS — F418 Other specified anxiety disorders: Secondary | ICD-10-CM | POA: Diagnosis not present

## 2020-09-03 MED ORDER — LORAZEPAM 0.5 MG PO TABS: 0.5000 mg | ORAL_TABLET | Freq: Every day | ORAL | 0 refills | Status: DC | PRN

## 2020-09-03 NOTE — Progress Notes (Signed)
Phone [phone] Virtual visit via Video note   Subjective:  Chief complaint: Chief Complaint  Patient presents with  . Anxiety    Patient states that she is getting married and she needs something to help her calm her nerves.    This visit type was conducted due to national recommendations for restrictions regarding the COVID-19 Pandemic (e.g. social distancing).  This format is felt to be most appropriate for this patient at this time balancing risks to patient and risks to population by having him in for in person visit.  No physical exam was performed (except for noted visual exam or audio findings with Telehealth visits).    Our team/I connected with Bobby Rumpf at 11:00 AM EDT by a video enabled telemedicine application (doxy.me or caregility through epic) and verified that I am speaking with the correct person using two identifiers.  Location patient: [place] Location provider: [facility], office Persons participating in the virtual visit:  patient  Our team/I discussed the limitations of evaluation and management by telemedicine and the availability of in person appointments. In light of current covid-19 pandemic, patient also understands that we are trying to protect them by minimizing in office contact if at all possible.  The patient expressed consent for telemedicine visit and agreed to proceed. Patient understands insurance will be billed.   Past Medical History-  Patient Active Problem List   Diagnosis Date Noted  . Bipolar II disorder ([place]) 01/18/2011    Priority: High  . Menopausal symptoms 10/14/2015    Priority: Medium  . Hypothyroidism 10/14/2015    Priority: Medium  . Insomnia 01/18/2011    Priority: Medium  . Constipation 12/19/2017    Priority: Low  . Allergic rhinitis 10/14/2015    Priority: Low  . Yeast infection 10/14/2015    Priority: Low  . Primary osteoarthritis of left hip 01/24/2015    Priority: Low  . History of squamous cell carcinoma of skin      Priority: Low  . Vitamin B12 deficiency 02/22/2019    Medications- reviewed and updated Current Outpatient Medications  Medication Sig Dispense Refill  . BIOTIN PO Take 1 tablet by mouth daily.     . Cholecalciferol (VITAMIN D3) 2000 units TABS Take 1 tablet by mouth daily.    Marland Kitchen escitalopram (LEXAPRO) 10 MG tablet TAKE 1 TABLET BY MOUTH EVERY DAY 30 tablet 1  . estradiol (ESTRACE) 1 MG tablet Take 2 tablets (2 mg total) by mouth 2 (two) times daily. 124 tablet 11  . fexofenadine (ALLERGY RELIEF) 180 MG tablet TAKE 1 TABLET BY MOUTH EVERY DAY PRN 30 tablet 6  . Glucosamine HCl (GLUCOSAMINE PO) Take 1 tablet by mouth daily.    Marland Kitchen lamoTRIgine (LAMICTAL) 100 MG tablet TAKE 1 TABLET BY MOUTH 2 TIMES DAILY 60 tablet 1  . levothyroxine (SYNTHROID) 75 MCG tablet TAKE 1 TABLET BY MOUTH EVERY DAY BEFORE BREAKFAST 90 tablet 0  . LORazepam (ATIVAN) 0.5 MG tablet Take 1 tablet (0.5 mg total) by mouth daily as needed for anxiety (do not drive or take ambien within 10 hours of this medicine). 10 tablet 0  . NONFORMULARY OR COMPOUNDED ITEM Boric acid suppository 600 mg once vaginally twice weekly 30 each 2  . Omega-3 Fatty Acids (FISH OIL PO) Take 1 capsule by mouth daily.     Vladimir Faster Glycol-Propyl Glycol (SYSTANE OP) Place 1 drop into both eyes daily as needed (for dry eyes).    . progesterone (PROMETRIUM) 100 MG capsule Take 1 capsule (  100 mg total) by mouth at bedtime. 30 capsule 12  . progesterone (PROMETRIUM) 100 MG capsule Take 1 capsule (100 mg total) by mouth daily. 30 capsule 12  . venlafaxine XR (EFFEXOR-XR) 75 MG 24 hr capsule TAKE 1 CAPSULE BY MOUTH EVERY DAY WITH BREAKFAST. please make appt 30 capsule 0  . zolpidem (AMBIEN) 10 MG tablet TAKE 1 TABLET BY MOUTH EVERY EVENING AS NEEDED FOR SLEEP 30 tablet 1   No current facility-administered medications for this visit.     Objective:  LMP 09/28/2008  self reported vitals Gen: NAD, resting comfortably Lungs: nonlabored, normal  respiratory rate  Skin: appears dry, no obvious rash     Assessment and Plan    #Bipolar 2 disorder #Anxiety situational-patient getting married soon and this has been causing increased anxiety- this upcoming weekend S: Escitalopram 10 mg, Lamictal 100 mg twice daily, venlafaxine 75 mg extended release. -ambien not working well but takes 6 out of 7 days. Works better if she skips a day.   Stressed with preparation for the new wedding. Also back at [employer] and that is going well. Feels like concentration has been impaired recently. Ex husband and her are involved in a lawsuit which is really stressful- probably the most stressful issue and that is leading to distraction. Fiancee with business stressors. She also is working on a prenuptial agreement.  Depression screen [facility] 2/9 09/03/2020 09/03/2020 08/22/2019  Decreased Interest 0 0 2  Down, Depressed, Hopeless 0 0 1  PHQ - 2 Score 0 0 3  Altered sleeping 2 - 1  Tired, decreased energy 0 - 3  Change in appetite 0 - 0  Feeling bad or failure about yourself  0 - 1  Trouble concentrating 2 - 2  Moving slowly or fidgety/restless 1 - 0  Suicidal thoughts 0 - 0  PHQ-9 Score 5 - 10  Difficult doing work/chores Somewhat difficult - Not difficult at all  A/P: Bipolar appears well controlled-continue current medications. Some situational anxiety with lawsuit and upcoming wedding. Considered buspirone but may increase serotonin syndrome risk on lexapro and venlafaxine. We opted for short term use of lorazepam at low dose with precautions about driving. She agrees to not take ambien within 10 hours (most likely would just not take it on days she takes lorazepam). No SI- low risk with current state for overdose.   Recommended follow up: as needed for acute concerns  Lab/Order associations:   ICD-10-CM   1. Bipolar II disorder ([place])  F31.81   2. Situational anxiety  F41.8     Meds ordered this encounter  Medications  . LORazepam (ATIVAN)  0.5 MG tablet    Sig: Take 1 tablet (0.5 mg total) by mouth daily as needed for anxiety (do not drive or take ambien within 10 hours of this medicine).    Dispense:  10 tablet    Refill:  0   Time Spent: Return precautions advised.  Garret Reddish, MD

## 2020-09-03 NOTE — Patient Instructions (Addendum)
Health Maintenance Due  Topic Date Due  . COVID-19 Vaccine (2 - Booster for [employer] series) Needs to schedule.  10/24/2019  . COLONOSCOPY (Pts 45-43yrs Insurance coverage will need to be confirmed) Discuss. 02/20/2020  . MAMMOGRAM Needs to schedule.  07/09/2020    Depression screen PHQ 2/9 09/03/2020 09/03/2020 08/22/2019  Decreased Interest 0 0 2  Down, Depressed, Hopeless 0 0 1  PHQ - 2 Score 0 0 3  Altered sleeping 2 - 1  Tired, decreased energy 0 - 3  Change in appetite 0 - 0  Feeling bad or failure about yourself  0 - 1  Trouble concentrating 2 - 2  Moving slowly or fidgety/restless 1 - 0  Suicidal thoughts 0 - 0  PHQ-9 Score 5 - 10  Difficult doing work/chores Somewhat difficult - Not difficult at all    Recommended follow up: No follow-ups on file.

## 2020-09-19 ENCOUNTER — Other Ambulatory Visit: Payer: Self-pay | Admitting: Nurse Practitioner

## 2020-09-22 ENCOUNTER — Other Ambulatory Visit: Payer: Self-pay | Admitting: Family Medicine

## 2020-09-23 NOTE — Telephone Encounter (Signed)
Rx denied

## 2020-09-30 ENCOUNTER — Other Ambulatory Visit: Payer: Self-pay | Admitting: Family Medicine

## 2020-10-13 DIAGNOSIS — Z1231 Encounter for screening mammogram for malignant neoplasm of breast: Secondary | ICD-10-CM | POA: Diagnosis not present

## 2020-10-13 LAB — HM MAMMOGRAPHY

## 2020-10-14 ENCOUNTER — Encounter: Payer: Self-pay | Admitting: Gastroenterology

## 2020-10-14 ENCOUNTER — Encounter: Payer: Self-pay | Admitting: Family Medicine

## 2020-10-29 ENCOUNTER — Encounter: Payer: Self-pay | Admitting: Family Medicine

## 2020-10-29 LAB — HM MAMMOGRAPHY

## 2020-11-03 ENCOUNTER — Encounter: Payer: Self-pay | Admitting: Family Medicine

## 2020-11-06 DIAGNOSIS — M545 Low back pain, unspecified: Secondary | ICD-10-CM | POA: Diagnosis not present

## 2020-11-07 ENCOUNTER — Other Ambulatory Visit: Payer: Self-pay | Admitting: Family Medicine

## 2020-11-11 ENCOUNTER — Other Ambulatory Visit: Payer: Self-pay | Admitting: Family Medicine

## 2020-11-13 DIAGNOSIS — M545 Low back pain, unspecified: Secondary | ICD-10-CM | POA: Diagnosis not present

## 2020-11-17 ENCOUNTER — Other Ambulatory Visit: Payer: Self-pay | Admitting: Family Medicine

## 2020-11-18 ENCOUNTER — Other Ambulatory Visit: Payer: Self-pay | Admitting: Nurse Practitioner

## 2020-11-18 NOTE — Telephone Encounter (Signed)
It appears Dr.Hunter prescribed rx today for patient.  Rx denied.

## 2020-11-27 DIAGNOSIS — M545 Low back pain, unspecified: Secondary | ICD-10-CM | POA: Diagnosis not present

## 2020-11-28 ENCOUNTER — Other Ambulatory Visit: Payer: Self-pay | Admitting: Family Medicine

## 2020-11-29 ENCOUNTER — Other Ambulatory Visit: Payer: Self-pay | Admitting: Family Medicine

## 2020-12-08 ENCOUNTER — Other Ambulatory Visit: Payer: Self-pay | Admitting: Family Medicine

## 2020-12-23 ENCOUNTER — Ambulatory Visit (AMBULATORY_SURGERY_CENTER): Payer: BC Managed Care – PPO | Admitting: *Deleted

## 2020-12-23 ENCOUNTER — Other Ambulatory Visit: Payer: Self-pay

## 2020-12-23 VITALS — Ht 68.5 in | Wt 155.0 lb

## 2020-12-23 DIAGNOSIS — Z1211 Encounter for screening for malignant neoplasm of colon: Secondary | ICD-10-CM

## 2020-12-23 MED ORDER — NA SULFATE-K SULFATE-MG SULF 17.5-3.13-1.6 GM/177ML PO SOLN: 1.0000 | Freq: Once | ORAL | 0 refills | Status: AC

## 2020-12-23 NOTE — Progress Notes (Signed)
Pt's previsit is done over the phone and all paperwork (prep instructions, blank consent form to just read over) sent to patient.  Pt's name and DOB verified at the beginning of the previsit.  Pt denies any difficulty with ambulating.    Pt does have a hx of PONV, denies being told they were difficult to intubate, or hx/fam hx of malignant hyperthermia per pt   No egg or soy allergy  No home oxygen use   No medications for weight loss taken  emmi information given  Pt informed that we do not do prior authorizations for prep  Pt does have constipation- 2 day Suprep and Miralax given

## 2020-12-25 DIAGNOSIS — M545 Low back pain, unspecified: Secondary | ICD-10-CM | POA: Diagnosis not present

## 2021-01-05 ENCOUNTER — Encounter: Payer: Self-pay | Admitting: Gastroenterology

## 2021-01-06 ENCOUNTER — Other Ambulatory Visit: Payer: Self-pay

## 2021-01-06 ENCOUNTER — Ambulatory Visit (AMBULATORY_SURGERY_CENTER): Payer: BC Managed Care – PPO | Admitting: Gastroenterology

## 2021-01-06 ENCOUNTER — Encounter: Payer: Self-pay | Admitting: Gastroenterology

## 2021-01-06 VITALS — BP 96/62 | HR 54 | Temp 98.7°F | Resp 10 | Ht 68.0 in | Wt 155.0 lb

## 2021-01-06 DIAGNOSIS — Z1211 Encounter for screening for malignant neoplasm of colon: Secondary | ICD-10-CM | POA: Diagnosis not present

## 2021-01-06 DIAGNOSIS — D125 Benign neoplasm of sigmoid colon: Secondary | ICD-10-CM

## 2021-01-06 DIAGNOSIS — K635 Polyp of colon: Secondary | ICD-10-CM

## 2021-01-06 MED ORDER — SODIUM CHLORIDE 0.9 % IV SOLN: 500.0000 mL | Freq: Once | INTRAVENOUS | Status: DC

## 2021-01-06 NOTE — Op Note (Signed)
[facility] Patient Name: Dorothy Lynch Procedure Date: 01/06/2021 9:29 AM MRN: [id number] Endoscopist: Thornton Park MD, MD Age: 60 Referring MD:  Date of Birth: [birth year] Gender: Female Account #: [id number] Procedure:                Colonoscopy Indications:              Screening for colorectal malignant neoplasm                           Normal colonoscopy 2011                           No known family history of colon cancer or polyps Medicines:                Monitored Anesthesia Care Procedure:                Pre-Anesthesia Assessment:                           - Prior to the procedure, a History and Physical                            was performed, and patient medications and                            allergies were reviewed. The patient's tolerance of                            previous anesthesia was also reviewed. The risks                            and benefits of the procedure and the sedation                            options and risks were discussed with the patient.                            All questions were answered, and informed consent                            was obtained. Prior Anticoagulants: The patient has                            taken no previous anticoagulant or antiplatelet                            agents. ASA Grade Assessment: II - A patient with                            mild systemic disease. After reviewing the risks                            and benefits, the patient was deemed in  satisfactory condition to undergo the procedure.                           After obtaining informed consent, the colonoscope                            was passed under direct vision. Throughout the                            procedure, the patient's blood pressure, pulse, and                            oxygen saturations were monitored continuously. The                            Olympus CF-HQ190L ([id number]) Colonoscope was                             introduced through the anus and advanced to the 3                            cm into the ileum. A second forward view of the                            right colon was performed. The colonoscopy was                            performed without difficulty. The patient tolerated                            the procedure well. The quality of the bowel                            preparation was good. The terminal ileum, ileocecal                            valve, appendiceal orifice, and rectum were                            photographed. Scope In: 9:34:51 AM Scope Out: 9:51:08 AM Scope Withdrawal Time: 0 hours 12 minutes 17 seconds  Total Procedure Duration: 0 hours 16 minutes 17 seconds  Findings:                 The perianal and digital rectal examinations were                            normal.                           Non-bleeding internal hemorrhoids were found.                           A few small-mouthed diverticula were found in the  sigmoid colon.                           A 3 mm polyp was found in the sigmoid colon. The                            polyp was sessile. The polyp was removed with a                            cold snare. Resection and retrieval were complete.                            Estimated blood loss was minimal.                           The exam was otherwise without abnormality on                            direct and retroflexion views. Complications:            No immediate complications. Estimated blood loss:                            Minimal. Estimated Blood Loss:     Estimated blood loss was minimal. Impression:               - Non-bleeding internal hemorrhoids.                           - Diverticulosis in the sigmoid colon.                           - One 3 mm polyp in the sigmoid colon, removed with                            a cold snare. Resected and retrieved.                           - The  examination was otherwise normal on direct                            and retroflexion views. Recommendation:           - Patient has a contact number available for                            emergencies. The signs and symptoms of potential                            delayed complications were discussed with the                            patient. Return to normal activities tomorrow.                            Written discharge instructions were provided  to the                            patient.                           - High fiber diet.                           - Continue present medications.                           - Await pathology results.                           - Repeat colonoscopy date to be determined after                            pending pathology results are reviewed for                            surveillance.                           - Emerging evidence supports eating a diet of                            fruits, vegetables, grains, calcium, and yogurt                            while reducing red meat and alcohol may reduce the                            risk of colon cancer.                           - Thank you for allowing me to be involved in your                            colon cancer prevention. Thornton Park MD, MD 01/06/2021 9:54:43 AM This report has been signed electronically.

## 2021-01-06 NOTE — Progress Notes (Signed)
Pt's states no medical or surgical changes since previsit or office visit. 

## 2021-01-06 NOTE — Progress Notes (Signed)
pt tolerated well. VSS. awake and to recovery. Report given to RN.  

## 2021-01-06 NOTE — Progress Notes (Signed)
Called to room to assist during endoscopic procedure.  Patient ID and intended procedure confirmed with present staff. Received instructions for my participation in the procedure from the performing physician.  

## 2021-01-06 NOTE — Patient Instructions (Signed)
Handouts given for polyps, hemorrhoids, diverticulosis and high fiber diet.  Await pathology results.  YOU HAD AN ENDOSCOPIC PROCEDURE TODAY AT [facility]:   Refer to the procedure report that was given to you for any specific questions about what was found during the examination.  If the procedure report does not answer your questions, please call your gastroenterologist to clarify.  If you requested that your care partner not be given the details of your procedure findings, then the procedure report has been included in a sealed envelope for you to review at your convenience later.  YOU SHOULD EXPECT: Some feelings of bloating in the abdomen. Passage of more gas than usual.  Walking can help get rid of the air that was put into your GI tract during the procedure and reduce the bloating. If you had a lower endoscopy (such as a colonoscopy or flexible sigmoidoscopy) you may notice spotting of blood in your stool or on the toilet paper. If you underwent a bowel prep for your procedure, you may not have a normal bowel movement for a few days.  Please Note:  You might notice some irritation and congestion in your nose or some drainage.  This is from the oxygen used during your procedure.  There is no need for concern and it should clear up in a day or so.  SYMPTOMS TO REPORT IMMEDIATELY:  Following lower endoscopy (colonoscopy or flexible sigmoidoscopy):  Excessive amounts of blood in the stool  Significant tenderness or worsening of abdominal pains  Swelling of the abdomen that is new, acute  Fever of 100F or higher  For urgent or emergent issues, a gastroenterologist can be reached at any hour by calling [phone]. Do not use MyChart messaging for urgent concerns.    DIET:  We do recommend a small meal at first, but then you may proceed to your regular diet.  Drink plenty of fluids but you should avoid alcoholic beverages for 24 hours.  ACTIVITY:  You should plan to  take it easy for the rest of today and you should NOT DRIVE or use heavy machinery until tomorrow (because of the sedation medicines used during the test).    FOLLOW UP: Our staff will call the number listed on your records 48-72 hours following your procedure to check on you and address any questions or concerns that you may have regarding the information given to you following your procedure. If we do not reach you, we will leave a message.  We will attempt to reach you two times.  During this call, we will ask if you have developed any symptoms of COVID 19. If you develop any symptoms (ie: fever, flu-like symptoms, shortness of breath, cough etc.) before then, please call [phone].  If you test positive for Covid 19 in the 2 weeks post procedure, please call and report this information to [place].    If any biopsies were taken you will be contacted by phone or by letter within the next 1-3 weeks.  Please call us at [phone] if you have not heard about the biopsies in 3 weeks.    SIGNATURES/CONFIDENTIALITY: You and/or your care partner have signed paperwork which will be entered into your electronic medical record.  These signatures attest to the fact that that the information above on your After Visit Summary has been reviewed and is understood.  Full responsibility of the confidentiality of this discharge information lies with you and/or your care-partner.

## 2021-01-08 ENCOUNTER — Other Ambulatory Visit: Payer: Self-pay | Admitting: Family Medicine

## 2021-01-08 ENCOUNTER — Telehealth: Payer: Self-pay

## 2021-01-08 NOTE — Telephone Encounter (Signed)
  Follow up Call-  Call back number 01/06/2021  Post procedure Call Back phone  # [phone]  Permission to leave phone message Yes  Some recent data might be hidden     Patient questions:  Do you have a fever, pain , or abdominal swelling? No. Pain Score  0 *  Have you tolerated food without any problems? Yes.    Have you been able to return to your normal activities? Yes.    Do you have any questions about your discharge instructions: Diet   No. Medications  No. Follow up visit  No.  Do you have questions or concerns about your Care? No.  Actions: * If pain score is 4 or above: No action needed, pain <4.

## 2021-01-12 ENCOUNTER — Encounter: Payer: Self-pay | Admitting: Gastroenterology

## 2021-01-16 ENCOUNTER — Other Ambulatory Visit: Payer: Self-pay | Admitting: Family Medicine

## 2021-01-16 NOTE — Telephone Encounter (Signed)
Last refill: 11/18/20 #30, 1 Last OV: 09/03/20 dx. Anxiety, bipolar II disorder

## 2021-01-26 ENCOUNTER — Encounter: Payer: Self-pay | Admitting: Family Medicine

## 2021-01-28 ENCOUNTER — Other Ambulatory Visit: Payer: Self-pay | Admitting: Family Medicine

## 2021-01-29 DIAGNOSIS — M545 Low back pain, unspecified: Secondary | ICD-10-CM | POA: Diagnosis not present

## 2021-01-30 DIAGNOSIS — M545 Low back pain, unspecified: Secondary | ICD-10-CM | POA: Diagnosis not present

## 2021-02-06 ENCOUNTER — Other Ambulatory Visit: Payer: Self-pay | Admitting: Family Medicine

## 2021-02-13 DIAGNOSIS — M79671 Pain in right foot: Secondary | ICD-10-CM | POA: Diagnosis not present

## 2021-02-13 DIAGNOSIS — M545 Low back pain, unspecified: Secondary | ICD-10-CM | POA: Diagnosis not present

## 2021-02-16 ENCOUNTER — Other Ambulatory Visit: Payer: Self-pay | Admitting: Family Medicine

## 2021-02-16 NOTE — Telephone Encounter (Signed)
LAST APPOINTMENT DATE: 01/16/2021   NEXT APPOINTMENT DATE: Visit date not found    LAST REFILL:01/16/2021  QTY:30

## 2021-02-19 ENCOUNTER — Telehealth: Payer: Self-pay

## 2021-02-19 NOTE — Telephone Encounter (Signed)
Pap smears due through age 60.   [email or web address] This is a good group of providers that recently formed. Can refer there if she would like

## 2021-02-19 NOTE — Telephone Encounter (Signed)
Reached out to pt and the mailbox was full.

## 2021-02-19 NOTE — Telephone Encounter (Signed)
Patient is calling in stating that her OBGYN doctor retired and is wondering if there is another one Dr.Hunter recommends her to see.

## 2021-02-19 NOTE — Telephone Encounter (Signed)
Returned pt call and she is wondering more so if she really needs an OBGYN since she is 54.

## 2021-03-06 ENCOUNTER — Other Ambulatory Visit: Payer: Self-pay | Admitting: Family Medicine

## 2021-03-06 ENCOUNTER — Other Ambulatory Visit: Payer: Self-pay | Admitting: *Deleted

## 2021-03-07 ENCOUNTER — Other Ambulatory Visit: Payer: Self-pay | Admitting: Family Medicine

## 2021-03-11 ENCOUNTER — Other Ambulatory Visit: Payer: Self-pay

## 2021-03-17 ENCOUNTER — Encounter: Payer: Self-pay | Admitting: Family Medicine

## 2021-03-17 ENCOUNTER — Other Ambulatory Visit: Payer: Self-pay

## 2021-03-17 ENCOUNTER — Ambulatory Visit: Payer: BC Managed Care – PPO | Admitting: Family Medicine

## 2021-03-17 VITALS — BP 112/70 | HR 61 | Temp 98.7°F | Ht 68.5 in | Wt 162.6 lb

## 2021-03-17 DIAGNOSIS — E039 Hypothyroidism, unspecified: Secondary | ICD-10-CM

## 2021-03-17 DIAGNOSIS — Z79890 Hormone replacement therapy (postmenopausal): Secondary | ICD-10-CM

## 2021-03-17 DIAGNOSIS — Z124 Encounter for screening for malignant neoplasm of cervix: Secondary | ICD-10-CM | POA: Diagnosis not present

## 2021-03-17 DIAGNOSIS — F3181 Bipolar II disorder: Secondary | ICD-10-CM | POA: Diagnosis not present

## 2021-03-17 DIAGNOSIS — E785 Hyperlipidemia, unspecified: Secondary | ICD-10-CM

## 2021-03-17 MED ORDER — CYCLOBENZAPRINE HCL 5 MG PO TABS: 5.0000 mg | ORAL_TABLET | Freq: Three times a day (TID) | ORAL | 1 refills | Status: DC | PRN

## 2021-03-17 NOTE — Progress Notes (Signed)
Phone [phone] In person visit   Subjective:   Dorothy Lynch is a 60 y.o. year old very pleasant female patient who presents for/with See problem oriented charting Chief Complaint  Patient presents with   Anxiety   Hypothyroidism   Bipolar II Disorder   This visit occurred during the SARS-CoV-2 public health emergency.  Safety protocols were in place, including screening questions prior to the visit, additional usage of staff PPE, and extensive cleaning of exam room while observing appropriate contact time as indicated for disinfecting solutions.   Past Medical History-  Patient Active Problem List   Diagnosis Date Noted   Bipolar II disorder ([place]) 01/18/2011    Priority: 1.   Menopausal symptoms 10/14/2015    Priority: 2.   Hypothyroidism 10/14/2015    Priority: 2.   Insomnia 01/18/2011    Priority: 2.   Constipation 12/19/2017    Priority: 3.   Allergic rhinitis 10/14/2015    Priority: 3.   Yeast infection 10/14/2015    Priority: 3.   Primary osteoarthritis of left hip 01/24/2015    Priority: 3.   History of squamous cell carcinoma of skin     Priority: 3.   Vitamin B12 deficiency 02/22/2019    Medications- reviewed and updated Current Outpatient Medications  Medication Sig Dispense Refill   AMBULATORY NON FORMULARY MEDICATION Medication Name: sero-bital 1 tablet daily     BIOTIN PO Take 1 tablet by mouth daily.      Cholecalciferol (VITAMIN D3) 2000 units TABS Take 1 tablet by mouth daily.     cyclobenzaprine (FLEXERIL) 5 MG tablet Take 1 tablet (5 mg total) by mouth 3 (three) times daily as needed for muscle spasms (do not take within 8 hours of ambien and cannot drive for 8 hours after taking-). 30 tablet 1   escitalopram (LEXAPRO) 10 MG tablet TAKE 1 TABLET BY MOUTH EVERY DAY 30 tablet 1   estradiol (ESTRACE) 1 MG tablet Take 2 tablets (2 mg total) by mouth 2 (two) times daily. 124 tablet 11   fexofenadine (ALLERGY RELIEF) 180 MG tablet TAKE 1 TABLET BY MOUTH  EVERY DAY PRN 30 tablet 6   Glucosamine HCl (GLUCOSAMINE PO) Take 1 tablet by mouth daily.     lamoTRIgine (LAMICTAL) 100 MG tablet TAKE 1 TABLET BY MOUTH 2 TIMES DAILY 60 tablet 1   levothyroxine (SYNTHROID) 75 MCG tablet TAKE 1 TABLET BY MOUTH EVERY MORNING BEFORE BREAKFAST 90 tablet 0   NONFORMULARY OR COMPOUNDED ITEM Boric acid suppository 600 mg once vaginally twice weekly 30 each 2   Omega-3 Fatty Acids (FISH OIL PO) Take 1 capsule by mouth daily.      Polyethyl Glycol-Propyl Glycol (SYSTANE OP) Place 1 drop into both eyes daily as needed (for dry eyes).     progesterone (PROMETRIUM) 100 MG capsule Take 1 capsule (100 mg total) by mouth at bedtime. 30 capsule 12   venlafaxine XR (EFFEXOR-XR) 75 MG 24 hr capsule TAKE 1 CAPSULE BY MOUTH EVERY DAY WITH BREAKFAST 30 capsule 0   zolpidem (AMBIEN) 10 MG tablet TAKE 1 TABLET BY MOUTH EVERY EVENING FOR SLEEP 30 tablet 5   No current facility-administered medications for this visit.     Objective:  BP 112/70   Pulse 61   Temp 98.7 F (37.1 C) (Temporal)   Ht 5' 8.5" (1.74 m)   Wt 162 lb 9.6 oz (73.8 kg)   LMP 09/28/2008   SpO2 95%   BMI 24.36 kg/m  Gen: NAD, resting comfortably  CV: RRR no murmurs rubs or gallops Lungs: CTAB no crackles, wheeze, rhonchi Ext: no edema Skin: warm, dry    Assessment and Plan   # Bipolar 2 disorder #Situational Anxiety - Married mid april 2022- wedding went really well S: Medication: Escitalopram 10 mg daily, Lamictal 100 mg twice daily listed- but only taking once daily,  venlafaxine 75 mg extended release daily - also takes Ambien 10 mg in the PM for sleep - added lorazepam 0.5 mg in last visit to help with more intense stressors around the time of her wedding- has not needed since tha ttime- will remove from list - patient states doesn't feel quite level of happiness that she would expect but overall things are rather stable. Sometimes hard to get up in the morning but also has some stressors. Feels  should be more excited about getting up in am but some weight from state of the world -some focus/memory issues recently (discussed depression could contribute)  - work has been stressful - trying to get doors to open. Money has been tight- still dealing with lawsuit with ex. Husband with business stressors.  - No SI in last visit- low risk with current state for overdose. no si today- if were to occur should let us know asap or call 911 A/P: phq9 score slightly worsened from last visit up from 5 to 9 . Continue current meds. Increase frequency of therapy meetings with church minister and update me in 2 months if not improving- may need to refer back to psychiatry if not improving. Thinks could be situational with world stressors - for insomnia- still doing [place]  #Muscle tension in lower back happens related to scoliosis. Requests to have Muscle relaxant on hand- discussed can take this but not within 8 hours of ambien and cannot drive for 8 hours after taking- she  skips ambien some nights to make more effective in long run- could use on those nightsb  -working with PT and finds helpful- Dr. Belia Heman- maintenance every 2-3 weeks  #hypothyroidism S: compliant On thyroid medication- levothyroxine 75 mcg every AM Lab Results  Component Value Date   TSH 2.54 08/22/2019   A/P:hopefully stable- update tsh today. Continue current meds for now   #hyperlipidemia S: Medication:none  Lab Results  Component Value Date   CHOL 195 12/11/2018   HDL 80.10 12/11/2018   LDLCALC 55 09/12/2014   LDLDIRECT 82.0 12/11/2018   TRIG 239.0 (H) 12/11/2018   CHOLHDL 2 12/11/2018   A/P: mild high cholesterol based on new guidelines- still likely not in range for meds- update lipid panel today  #Hormone replacement - through GYN- needs new referral  Recommended follow up: Return in about 6 months (around 09/15/2021) for a physical or sooner if needed.  Lab/Order associations:   ICD-10-CM   1. Bipolar II  disorder ([place])  F31.81     2. Hypothyroidism, unspecified type  E03.9 TSH    3. Screening for cervical cancer  Z12.4 Ambulatory referral to Gynecology    4. Mild hyperlipidemia  E78.5 CBC with Differential/Platelet    Comprehensive metabolic panel    Lipid panel    5. Hormone replacement therapy  Z79.890 Ambulatory referral to Gynecology      Meds ordered this encounter  Medications   cyclobenzaprine (FLEXERIL) 5 MG tablet    Sig: Take 1 tablet (5 mg total) by mouth 3 (three) times daily as needed for muscle spasms (do not take within 8 hours of ambien and cannot drive for  8 hours after taking-).    Dispense:  30 tablet    Refill:  1   I,Harris Phan,acting as a scribe for Garret Reddish, MD.,have documented all relevant documentation on the behalf of Garret Reddish, MD,as directed by  Garret Reddish, MD while in the presence of Garret Reddish, MD.  I, Garret Reddish, MD, have reviewed all documentation for this visit. The documentation on 03/17/21 for the exam, diagnosis, procedures, and orders are all accurate and complete.   Return precautions advised.  Garret Reddish, MD

## 2021-03-17 NOTE — Patient Instructions (Addendum)
Health Maintenance Due  Topic Date Due   Zoster Vaccines- Shingrix (2 of 2)  - [facility] - we will give them a call to get this information. 08/17/2019   Please stop by lab before you go If you have mychart- we will send your results within 3 business days of [place] receiving them.  If you do not have mychart- we will call you about results within 5 business days of [place] receiving them.  *please also note that you will see labs on mychart as soon as they post. I will later go in and write notes on them- will say "notes from Dr. Yong Channel"  Increase frequency with your counseling/therapy with your church minister - please update with me with 2 months on how things are going for you. If there is no improvement then we can consider placing a referral back in with psychiatry.   If you experience any thoughts of self harm please let me know immediately or call 911 to seek medical attention.  We will call you within two weeks about your referral to Dr. Juliann Pulse [facility]-Gynecology. If you do not hear within 2 weeks, give [place] a call.   Recommended follow up: Return in about 6 months (around 09/15/2021) for a physical or sooner if needed.

## 2021-03-18 LAB — LIPID PANEL
Cholesterol: 213 mg/dL — ABNORMAL HIGH (ref 0–200)
HDL: 93 mg/dL (ref >39.00)
NonHDL: 119.83
Total CHOL/HDL Ratio: 2
Triglycerides: 377 mg/dL — ABNORMAL HIGH (ref 0.0–149.0)
VLDL: 75.4 mg/dL — ABNORMAL HIGH (ref 0.0–40.0)

## 2021-03-18 LAB — CBC WITH DIFFERENTIAL/PLATELET
Basophils Absolute: 0.1 10*3/uL (ref 0.0–0.1)
Basophils Relative: 1.4 % (ref 0.0–3.0)
Eosinophils Absolute: 0.2 10*3/uL (ref 0.0–0.7)
Eosinophils Relative: 2.7 % (ref 0.0–5.0)
HCT: 37.4 % (ref 36.0–46.0)
Hemoglobin: 12.4 g/dL (ref 12.0–15.0)
Lymphocytes Relative: 28.7 % (ref 12.0–46.0)
Lymphs Abs: 1.8 10*3/uL (ref 0.7–4.0)
MCHC: 33.2 g/dL (ref 30.0–36.0)
MCV: 96.8 fl (ref 78.0–100.0)
Monocytes Absolute: 0.6 10*3/uL (ref 0.1–1.0)
Monocytes Relative: 9.5 % (ref 3.0–12.0)
Neutro Abs: 3.5 10*3/uL (ref 1.4–7.7)
Neutrophils Relative %: 57.7 % (ref 43.0–77.0)
Platelets: 256 10*3/uL (ref 150.0–400.0)
RBC: 3.87 Mil/uL (ref 3.87–5.11)
RDW: 13.4 % (ref 11.5–15.5)
WBC: 6.1 10*3/uL (ref 4.0–10.5)

## 2021-03-18 LAB — TSH: TSH: 4.95 u[IU]/mL (ref 0.35–5.50)

## 2021-03-18 LAB — COMPREHENSIVE METABOLIC PANEL
ALT: 16 U/L (ref 0–35)
AST: 20 U/L (ref 0–37)
Albumin: 3.9 g/dL (ref 3.5–5.2)
Alkaline Phosphatase: 61 U/L (ref 39–117)
BUN: 14 mg/dL (ref 6–23)
CO2: 25 mEq/L (ref 19–32)
Calcium: 9 mg/dL (ref 8.4–10.5)
Chloride: 103 mEq/L (ref 96–112)
Creatinine, Ser: 0.82 mg/dL (ref 0.40–1.20)
GFR: 77.01 mL/min (ref >60.00)
Glucose, Bld: 100 mg/dL — ABNORMAL HIGH (ref 70–99)
Potassium: 4 mEq/L (ref 3.5–5.1)
Sodium: 136 mEq/L (ref 135–145)
Total Bilirubin: 0.2 mg/dL (ref 0.2–1.2)
Total Protein: 6.7 g/dL (ref 6.0–8.3)

## 2021-03-18 LAB — LDL CHOLESTEROL, DIRECT: Direct LDL: 82 mg/dL

## 2021-03-26 ENCOUNTER — Telehealth: Payer: Self-pay

## 2021-03-26 ENCOUNTER — Other Ambulatory Visit: Payer: Self-pay

## 2021-03-26 MED ORDER — FEXOFENADINE HCL 180 MG PO TABS: ORAL_TABLET | ORAL | 6 refills | Status: DC

## 2021-03-26 NOTE — Telephone Encounter (Signed)
Pt medication refilled

## 2021-03-26 NOTE — Telephone Encounter (Signed)
LAST APPOINTMENT DATE:  03/17/21  NEXT APPOINTMENT DATE: None  MEDICATION:fexofenadine (ALLERGY RELIEF) 180 MG tablet  PHARMACY: [facility] - [place], [place] - [address]

## 2021-04-03 ENCOUNTER — Other Ambulatory Visit: Payer: Self-pay | Admitting: Family Medicine

## 2021-04-06 DIAGNOSIS — M79671 Pain in right foot: Secondary | ICD-10-CM | POA: Diagnosis not present

## 2021-04-06 DIAGNOSIS — M545 Low back pain, unspecified: Secondary | ICD-10-CM | POA: Diagnosis not present

## 2021-04-07 ENCOUNTER — Other Ambulatory Visit: Payer: Self-pay | Admitting: Family Medicine

## 2021-04-09 ENCOUNTER — Other Ambulatory Visit: Payer: Self-pay | Admitting: Family Medicine

## 2021-04-10 ENCOUNTER — Other Ambulatory Visit: Payer: Self-pay | Admitting: Family Medicine

## 2021-04-15 ENCOUNTER — Other Ambulatory Visit: Payer: Self-pay

## 2021-04-15 NOTE — Telephone Encounter (Signed)
Received paper refill request for progesterone 100mg  caps. Last annual exam was on 02/15/20 with Dr. Dellis Filbert and no further appts scheduled. I wrote "Needs to schedule annual exam before further refills" on paper and faxed back to [facility] @ [phone].

## 2021-04-17 ENCOUNTER — Other Ambulatory Visit: Payer: Self-pay | Admitting: Family Medicine

## 2021-04-21 ENCOUNTER — Other Ambulatory Visit: Payer: Self-pay

## 2021-04-21 ENCOUNTER — Telehealth: Payer: Self-pay

## 2021-04-21 MED ORDER — ESTRADIOL 1 MG PO TABS: 2.0000 mg | ORAL_TABLET | Freq: Two times a day (BID) | ORAL | 11 refills | Status: DC

## 2021-04-21 NOTE — Telephone Encounter (Signed)
RX sent in

## 2021-04-21 NOTE — Telephone Encounter (Signed)
LAST APPOINTMENT DATE:  03/17/21  NEXT APPOINTMENT DATE: None  MEDICATION:estradiol (ESTRACE) 1 MG tablet  progesterone (PROMETRIUM) 100 MG capsule  PHARMACY: [facility] - [place], [place] - [address]

## 2021-04-24 ENCOUNTER — Other Ambulatory Visit: Payer: Self-pay | Admitting: Family Medicine

## 2021-04-27 NOTE — Telephone Encounter (Signed)
Patient calling to see if this could be sent in she waiting to see a new gynecologist.

## 2021-05-08 ENCOUNTER — Other Ambulatory Visit: Payer: Self-pay | Admitting: Family Medicine

## 2021-05-11 ENCOUNTER — Other Ambulatory Visit: Payer: Self-pay | Admitting: Family Medicine

## 2021-05-18 DIAGNOSIS — M79671 Pain in right foot: Secondary | ICD-10-CM | POA: Diagnosis not present

## 2021-05-18 DIAGNOSIS — M545 Low back pain, unspecified: Secondary | ICD-10-CM | POA: Diagnosis not present

## 2021-05-19 DIAGNOSIS — Z01419 Encounter for gynecological examination (general) (routine) without abnormal findings: Secondary | ICD-10-CM | POA: Diagnosis not present

## 2021-05-19 DIAGNOSIS — Z124 Encounter for screening for malignant neoplasm of cervix: Secondary | ICD-10-CM | POA: Diagnosis not present

## 2021-05-19 DIAGNOSIS — Z780 Asymptomatic menopausal state: Secondary | ICD-10-CM | POA: Diagnosis not present

## 2021-05-19 DIAGNOSIS — N950 Postmenopausal bleeding: Secondary | ICD-10-CM | POA: Diagnosis not present

## 2021-05-19 DIAGNOSIS — Z130 Encounter for screening for diseases of the blood and blood-forming organs and certain disorders involving the immune mechanism: Secondary | ICD-10-CM | POA: Diagnosis not present

## 2021-05-27 ENCOUNTER — Other Ambulatory Visit: Payer: Self-pay | Admitting: Family Medicine

## 2021-05-31 DIAGNOSIS — N870 Mild cervical dysplasia: Secondary | ICD-10-CM

## 2021-05-31 HISTORY — DX: Mild cervical dysplasia: N87.0

## 2021-06-03 ENCOUNTER — Other Ambulatory Visit: Payer: Self-pay | Admitting: Family Medicine

## 2021-06-06 ENCOUNTER — Other Ambulatory Visit: Payer: Self-pay | Admitting: Family Medicine

## 2021-06-08 ENCOUNTER — Other Ambulatory Visit: Payer: Self-pay | Admitting: Family Medicine

## 2021-06-08 DIAGNOSIS — N950 Postmenopausal bleeding: Secondary | ICD-10-CM | POA: Diagnosis not present

## 2021-06-08 DIAGNOSIS — D219 Benign neoplasm of connective and other soft tissue, unspecified: Secondary | ICD-10-CM

## 2021-06-08 HISTORY — DX: Benign neoplasm of connective and other soft tissue, unspecified: D21.9

## 2021-06-12 ENCOUNTER — Other Ambulatory Visit: Payer: Self-pay | Admitting: Family Medicine

## 2021-06-15 DIAGNOSIS — M79671 Pain in right foot: Secondary | ICD-10-CM | POA: Diagnosis not present

## 2021-06-15 DIAGNOSIS — M545 Low back pain, unspecified: Secondary | ICD-10-CM | POA: Diagnosis not present

## 2021-06-17 DIAGNOSIS — H353132 Nonexudative age-related macular degeneration, bilateral, intermediate dry stage: Secondary | ICD-10-CM | POA: Diagnosis not present

## 2021-06-17 DIAGNOSIS — L820 Inflamed seborrheic keratosis: Secondary | ICD-10-CM | POA: Diagnosis not present

## 2021-06-17 DIAGNOSIS — L821 Other seborrheic keratosis: Secondary | ICD-10-CM | POA: Diagnosis not present

## 2021-06-17 DIAGNOSIS — L570 Actinic keratosis: Secondary | ICD-10-CM | POA: Diagnosis not present

## 2021-06-17 DIAGNOSIS — H2513 Age-related nuclear cataract, bilateral: Secondary | ICD-10-CM | POA: Diagnosis not present

## 2021-06-17 DIAGNOSIS — L814 Other melanin hyperpigmentation: Secondary | ICD-10-CM | POA: Diagnosis not present

## 2021-06-17 DIAGNOSIS — H04123 Dry eye syndrome of bilateral lacrimal glands: Secondary | ICD-10-CM | POA: Diagnosis not present

## 2021-06-17 DIAGNOSIS — H35423 Microcystoid degeneration of retina, bilateral: Secondary | ICD-10-CM | POA: Diagnosis not present

## 2021-06-17 DIAGNOSIS — Z85828 Personal history of other malignant neoplasm of skin: Secondary | ICD-10-CM | POA: Diagnosis not present

## 2021-06-17 DIAGNOSIS — I788 Other diseases of capillaries: Secondary | ICD-10-CM | POA: Diagnosis not present

## 2021-07-03 ENCOUNTER — Other Ambulatory Visit: Payer: Self-pay | Admitting: Family Medicine

## 2021-07-13 ENCOUNTER — Other Ambulatory Visit: Payer: Self-pay | Admitting: Family Medicine

## 2021-08-03 ENCOUNTER — Other Ambulatory Visit: Payer: Self-pay | Admitting: Family Medicine

## 2021-08-03 ENCOUNTER — Ambulatory Visit: Payer: BC Managed Care – PPO | Admitting: Obstetrics and Gynecology

## 2021-08-03 ENCOUNTER — Encounter: Payer: Self-pay | Admitting: Obstetrics and Gynecology

## 2021-08-03 ENCOUNTER — Other Ambulatory Visit: Payer: Self-pay

## 2021-08-03 VITALS — BP 110/64 | HR 70 | Wt 158.0 lb

## 2021-08-03 DIAGNOSIS — N951 Menopausal and female climacteric states: Secondary | ICD-10-CM | POA: Diagnosis not present

## 2021-08-03 DIAGNOSIS — Z79890 Hormone replacement therapy (postmenopausal): Secondary | ICD-10-CM

## 2021-08-03 DIAGNOSIS — N950 Postmenopausal bleeding: Secondary | ICD-10-CM | POA: Diagnosis not present

## 2021-08-03 NOTE — Progress Notes (Signed)
GYNECOLOGY  VISIT   HPI: 61 y.o.   Legally Separated  Caucasian  female   [phone] with Patient's last menstrual period was 09/28/2008.   here to discuss HRT. Having night sweats, hot flashes and no energy, Patient has had some vaginal bleeding.   She is a former patient of Dr. Audie Box and Dr. Penni Bombard.   Has been seeing Eve Key at [facility] and had a pelvic ultrasound in January, 2023.  Patient does not want exam today.  She has been tapering off of her estrogen since establishing care at [facility].  Bleeding started a couple of weeks later.  Her last bleeding was one month ago.   Wants to stay on her HRT.   On chart review, she was taking Estrace 4 mg daily and Prometrium 100 mg daily in 2021.   She reports decreased energy, changes in skin and skin and hair, and hot flashes. Recently married and wants to have good sexual functioning.   In the past, patient has had bleeding and benign polyps, removed in 2018 with Dr. Audie Box.   Has varicose veins.  GYNECOLOGIC HISTORY: Patient's last menstrual period was 09/28/2008. Contraception:  PMP Menopausal hormone therapy:  Estrace, Prometrium Last mammogram:  10-29-20 SEE EPIC Last pap smear:   01-22-19 Neg, 01-19-18 ASCUS:Neg HR HPV, 01-19-17 ASCUS:Neg HR HPV        OB History     Gravida  3   Para  2   Term      Preterm      AB  1   Living  2      SAB  1   IAB      Ectopic      Multiple      Live Births                 Patient Active Problem List   Diagnosis Date Noted   Vitamin B12 deficiency 02/22/2019   Constipation 12/19/2017   Menopausal symptoms 10/14/2015   Allergic rhinitis 10/14/2015   Yeast infection 10/14/2015   Hypothyroidism 10/14/2015   Primary osteoarthritis of left hip 01/24/2015   History of squamous cell carcinoma of skin    Bipolar II disorder (H[facility]) 01/18/2011   Insomnia 01/18/2011    Past Medical History:  Diagnosis Date   Allergy    Anxiety    Arthritis     ASCUS of cervix with negative high risk HPV 12/2016   Bipolar 2 disorder ([facility])    Cancer (HCC)    Chronic sinusitis    Chronic venous insufficiency    superficial---  s/p  failed attempt sclerotherapy   DDD (degenerative disc disease)    Depression    Endometrial polyp    History of basal cell carcinoma (BCC) excision    08-30-2014  left mid medial chest   History of squamous cell carcinoma excision    2016 approx. x3 shoulder area/   left lower leg , shin 02-02-2017 approx.   Hypothyroidism    Macular degeneration    PMB (postmenopausal bleeding)    PONV (postoperative nausea and vomiting)    Scoliosis    Varicose vein of leg     Past Surgical History:  Procedure Laterality Date   ACHILLES TENDON SURGERY  2001   BREAST SURGERY  march 2011   breast bx-B-9, fibrocyctic changes   COLONOSCOPY  02-19-2010  dr Verlee Monte brodie   DILATATION & CURETTAGE/HYSTEROSCOPY WITH MYOSURE N/A 02/21/2017   Procedure: DILATATION & CURETTAGE/HYSTEROSCOPY WITH MYOSURE;  Surgeon: Dara Lords, MD;  Location: [facility];  Service: Gynecology;  Laterality: N/A;   FACIAL COSMETIC SURGERY  2012   INGUINAL HERNIA REPAIR Left 1995   2 hernia's on left side   TOTAL HIP ARTHROPLASTY Left 01/24/2015   Procedure: TOTAL HIP ARTHROPLASTY ANTERIOR APPROACH;  Surgeon: Jodi Geralds, MD;  Location: [facility];  Service: Orthopedics;  Laterality: Left;    Current Outpatient Medications  Medication Sig Dispense Refill   AMBULATORY NON FORMULARY MEDICATION Medication Name: sero-bital 1 tablet daily     Biotin 1 MG CAPS SMARTSIG:1 By Mouth     BIOTIN PO Take 1 tablet by mouth daily.      Cholecalciferol (VITAMIN D3) 2000 units TABS Take 1 tablet by mouth daily.     escitalopram (LEXAPRO) 10 MG tablet TAKE 1 TABLET BY MOUTH EVERY DAY 30 tablet 0   estradiol (ESTRACE) 1 MG tablet Take 2 tablets (2 mg total) by mouth 2 (two) times daily. 124 tablet 11   fexofenadine (ALLERGY RELIEF) 180 MG tablet TAKE 1  TABLET BY MOUTH EVERY DAY PRN 30 tablet 6   Glucosamine HCl (GLUCOSAMINE PO) Take 1 tablet by mouth daily.     lamoTRIgine (LAMICTAL) 100 MG tablet TAKE 1 TABLET BY MOUTH 2 TIMES DAILY 60 tablet 0   levothyroxine (SYNTHROID) 75 MCG tablet TAKE 1 TABLET BY MOUTH EVERY DAY BEFORE BREAKFAST 90 tablet 0   NONFORMULARY OR COMPOUNDED ITEM Boric acid suppository 600 mg once vaginally twice weekly 30 each 2   Omega-3 Fatty Acids (FISH OIL PO) Take 1 capsule by mouth daily.      Polyethyl Glycol-Propyl Glycol (SYSTANE OP) Place 1 drop into both eyes daily as needed (for dry eyes).     progesterone (PROMETRIUM) 100 MG capsule Take 100 mg by mouth daily.     RETIN-A 0.05 % cream Apply 1 application. topically at bedtime.     venlafaxine XR (EFFEXOR-XR) 75 MG 24 hr capsule TAKE 1 CAPSULE BY MOUTH EVERY DAY WITH BREAKFAST 30 capsule 0   zolpidem (AMBIEN) 10 MG tablet TAKE 1 TABLET BY MOUTH EVERY EVENING FOR SLEEP 30 tablet 5   No current facility-administered medications for this visit.     ALLERGIES: Penicillins and Sulfa antibiotics  Family History  Problem Relation Age of Onset   Thyroid disease Mother    Breast cancer Mother 10       62 in 2017, survived breast cancer   Heart failure Mother    Diabetes Father    Hypertension Father    Stroke Father 42       died at 9   CVA Brother        50s smoking   Alcohol abuse Brother        recovering   Cancer Maternal Grandmother 23       lung, smoker   Stroke Maternal Grandfather    Heart disease Paternal Grandmother        mi   Colon cancer Neg Hx    Rectal cancer Neg Hx    Stomach cancer Neg Hx     Social History   Socioeconomic History   Marital status: Legally Separated    Spouse name: Not on file   Number of children: Not on file   Years of education: Not on file   Highest education level: Not on file  Occupational History   Not on file  Tobacco Use   Smoking status: Never   Smokeless tobacco: Never  Vaping Use   Vaping  Use: Never used  Substance and Sexual Activity   Alcohol use: Yes    Alcohol/week: 14.0 standard drinks    Types: 7 Glasses of wine, 7 Standard drinks or equivalent per week    Comment: daily- wine    Drug use: No   Sexual activity: Yes    Partners: Male    Birth control/protection: Post-menopausal    Comment: 1st intercourse 61 yo-Fewer than 5 partners  Other Topics Concern   Not on file  Social History Narrative   Re-married April 2022 to husband Weston Brass. Divorced from ex-husband John. 2 children. Daughter Boneta Lucks is my patient- works in [place]. Son in [place].        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  All other systems reviewed and are negative.  PHYSICAL EXAMINATION:    BP 110/64   Pulse 70   Wt 158 lb (71.7 kg)   LMP 09/28/2008   SpO2 98%   BMI 23.67 kg/m     General appearance: alert, cooperative and appears stated age  Examination declined.   ASSESSMENT  Postmenopausal bleeding.  HRT.   Menopausal symptoms.  Varicose veins.  Chronic venous insufficiency.  FH breast cancer. Bipolar disorder.  On Effexor, Lexapro, and Lamictal.  Stable on this regimen.   PLAN  Postmenopausal bleeding discussed.  We reviewed risks and benefits of HRT.  Risks may include PE, DVT, MI, stroke and breast cancer.  Will get records from [facility] to review.  Will then determine if the patient needs additional evaluation like cervical cancer screening, sonohysterogram, endometrial biopsy.  Consider starting ASA 81 mg  daily.  She will check with her PCP about this. Follow up in 2 weeks for examination and review of records.   An After Visit Summary was printed and given to the patient.  35 min  total time was spent for this patient encounter, including preparation, face-to-face counseling with the patient, coordination of care, and documentation of the encounter.

## 2021-08-10 DIAGNOSIS — M25552 Pain in left hip: Secondary | ICD-10-CM | POA: Diagnosis not present

## 2021-08-10 DIAGNOSIS — M5441 Lumbago with sciatica, right side: Secondary | ICD-10-CM | POA: Diagnosis not present

## 2021-08-12 DIAGNOSIS — M79671 Pain in right foot: Secondary | ICD-10-CM | POA: Diagnosis not present

## 2021-08-12 DIAGNOSIS — M545 Low back pain, unspecified: Secondary | ICD-10-CM | POA: Diagnosis not present

## 2021-08-14 ENCOUNTER — Ambulatory Visit: Payer: BC Managed Care – PPO | Admitting: Obstetrics and Gynecology

## 2021-08-14 ENCOUNTER — Other Ambulatory Visit: Payer: Self-pay | Admitting: Family Medicine

## 2021-08-17 ENCOUNTER — Ambulatory Visit: Payer: BC Managed Care – PPO | Admitting: Obstetrics and Gynecology

## 2021-08-17 ENCOUNTER — Other Ambulatory Visit: Payer: Self-pay

## 2021-08-17 ENCOUNTER — Encounter: Payer: Self-pay | Admitting: Obstetrics and Gynecology

## 2021-08-17 VITALS — BP 124/78 | Resp 14 | Ht 68.5 in | Wt 158.0 lb

## 2021-08-17 DIAGNOSIS — N950 Postmenopausal bleeding: Secondary | ICD-10-CM

## 2021-08-17 DIAGNOSIS — D219 Benign neoplasm of connective and other soft tissue, unspecified: Secondary | ICD-10-CM

## 2021-08-17 DIAGNOSIS — Z79890 Hormone replacement therapy (postmenopausal): Secondary | ICD-10-CM

## 2021-08-17 DIAGNOSIS — R87810 Cervical high risk human papillomavirus (HPV) DNA test positive: Secondary | ICD-10-CM | POA: Diagnosis not present

## 2021-08-17 NOTE — Progress Notes (Signed)
GYNECOLOGY  VISIT ?  ?HPI: ?61 y.o.   Legally Separated  Caucasian  female   ?[id number] with Patient's last menstrual period was 09/28/2008.   ?here for 2 week follow up. ? ?Some spotting once every 2 weeks.  ?On Estrace 1 mg daily and Prometrium 100 mg q hs.  ? ?She is not having hot flashes during the day.  ?It is the night sweats that are most bothersome.  ? ?She also has decreased sex drive and is newly married.  ? ?Feels better on higher estrogen dosage.  ? ?Pelvic US 06/08/21 at [place]. ?Uterus 9.78 x 5.31cm ?3 fibroids;  4.09 cm, 2.67 cm - both intramural, 1.38 submucosal ?EMS 5.67 mm.  ?Right ovary normal. Left ovary not seen.  ?Normal cul de sac. ? ?Doing well on stable dosage of Lamictal.  ? ?GYNECOLOGIC HISTORY: ?Patient's last menstrual period was 09/28/2008. ?Contraception:  PMP ?Menopausal hormone therapy:  Estrace, Prometrium ?Last mammogram:  10-29-20 SEE EPIC ?Last pap smear:    05/19/21 - normal pap, positive HR HPV at [facility], 01-22-19 Neg, 01-19-18 ASCUS:Neg HR HPV, 01-19-17 ASCUS:Neg HR HPV ?       ?OB History   ? ? Gravida  ?3  ? Para  ?2  ? Term  ?   ? Preterm  ?   ? AB  ?1  ? Living  ?2  ?  ? ? SAB  ?1  ? IAB  ?   ? Ectopic  ?   ? Multiple  ?   ? Live Births  ?   ?   ?  ?  ?    ? ?Patient Active Problem List  ? Diagnosis Date Noted  ? Vitamin B12 deficiency 02/22/2019  ? Constipation 12/19/2017  ? Menopausal symptoms 10/14/2015  ? Allergic rhinitis 10/14/2015  ? Yeast infection 10/14/2015  ? Hypothyroidism 10/14/2015  ? Primary osteoarthritis of left hip 01/24/2015  ? History of squamous cell carcinoma of skin   ? Bipolar II disorder ([place]) 01/18/2011  ? Insomnia 01/18/2011  ? ? ?Past Medical History:  ?Diagnosis Date  ? Allergy   ? Anxiety   ? Arthritis   ? ASCUS of cervix with negative high risk HPV 12/2016  ? Bipolar 2 disorder ([place])   ? Cancer [facility])   ? Chronic sinusitis   ? Chronic venous insufficiency   ? superficial---  s/p  failed attempt sclerotherapy  ? DDD  (degenerative disc disease)   ? Depression   ? Endometrial polyp   ? History of basal cell carcinoma (BCC) excision   ? 08-30-2014  left mid medial chest  ? History of squamous cell carcinoma excision   ? 2016 approx. x3 shoulder area/   left lower leg , shin 02-02-2017 approx.  ? Hypothyroidism   ? Macular degeneration   ? PMB (postmenopausal bleeding)   ? PONV (postoperative nausea and vomiting)   ? Scoliosis   ? Varicose vein of leg   ? ? ?Past Surgical History:  ?Procedure Laterality Date  ? ACHILLES TENDON SURGERY  2001  ? BREAST SURGERY  march 2011  ? breast bx-B-9, fibrocyctic changes  ? COLONOSCOPY  02-19-2010  dr Sydell Axon brodie  ? DILATATION & CURETTAGE/HYSTEROSCOPY WITH MYOSURE N/A 02/21/2017  ? Procedure: [place];  Surgeon: Anastasio Auerbach, MD;  Location: [place];  Service: Gynecology;  Laterality: N/A;  ? FACIAL COSMETIC SURGERY  2012  ? INGUINAL HERNIA REPAIR Left 1995  ? 2 hernia's  on left side  ? TOTAL HIP ARTHROPLASTY Left 01/24/2015  ? Procedure: TOTAL HIP ARTHROPLASTY ANTERIOR APPROACH;  Surgeon: Dorna Leitz, MD;  Location: [place];  Service: Orthopedics;  Laterality: Left;  ? ? ?Current Outpatient Medications  ?Medication Sig Dispense Refill  ? AMBULATORY NON FORMULARY MEDICATION Medication Name: sero-bital 1 tablet daily    ? Biotin 1 MG CAPS SMARTSIG:1 By Mouth    ? Cholecalciferol (VITAMIN D3) 2000 units TABS Take 1 tablet by mouth daily.    ? escitalopram (LEXAPRO) 10 MG tablet TAKE 1 TABLET BY MOUTH EVERY DAY 30 tablet 0  ? estradiol (ESTRACE) 1 MG tablet Take 2 tablets (2 mg total) by mouth 2 (two) times daily. 124 tablet 11  ? fexofenadine (ALLERGY RELIEF) 180 MG tablet TAKE 1 TABLET BY MOUTH EVERY DAY PRN 30 tablet 6  ? Glucosamine HCl (GLUCOSAMINE PO) Take 1 tablet by mouth daily.    ? lamoTRIgine (LAMICTAL) 100 MG tablet TAKE 1 TABLET BY MOUTH 2 TIMES DAILY 60 tablet 0  ? levothyroxine (SYNTHROID) 75 MCG tablet TAKE 1 TABLET BY  MOUTH EVERY DAY BEFORE BREAKFAST 90 tablet 0  ? NONFORMULARY OR COMPOUNDED ITEM Boric acid suppository 600 mg once vaginally twice weekly 30 each 2  ? Omega-3 Fatty Acids (FISH OIL PO) Take 1 capsule by mouth daily.     ? Polyethyl Glycol-Propyl Glycol (SYSTANE OP) Place 1 drop into both eyes daily as needed (for dry eyes).    ? progesterone (PROMETRIUM) 100 MG capsule Take 100 mg by mouth daily.    ? RETIN-A 0.05 % cream Apply 1 application. topically at bedtime.    ? venlafaxine XR (EFFEXOR-XR) 75 MG 24 hr capsule TAKE 1 CAPSULE BY MOUTH EVERY DAY WITH BREAKFAST 30 capsule 0  ? zolpidem (AMBIEN) 10 MG tablet TAKE 1 TABLET BY MOUTH EVERY EVENING FOR SLEEP 30 tablet 5  ? ?No current facility-administered medications for this visit.  ?  ? ?ALLERGIES: Penicillins and Sulfa antibiotics ? ?Family History  ?Problem Relation Age of Onset  ? Thyroid disease Mother   ? Breast cancer Mother 64  ?     36 in 2017, survived breast cancer  ? Heart failure Mother   ? Diabetes Father   ? Hypertension Father   ? Stroke Father 20  ?     died at 89  ? CVA Brother   ?     68s smoking  ? Alcohol abuse Brother   ?     recovering  ? Cancer Maternal Grandmother 35  ?     lung, smoker  ? Stroke Maternal Grandfather   ? Heart disease Paternal Grandmother   ?     mi  ? Colon cancer Neg Hx   ? Rectal cancer Neg Hx   ? Stomach cancer Neg Hx   ? ? ?Social History  ? ?Socioeconomic History  ? Marital status: Legally Separated  ?  Spouse name: Not on file  ? Number of children: Not on file  ? Years of education: Not on file  ? Highest education level: Not on file  ?Occupational History  ? Not on file  ?Tobacco Use  ? Smoking status: Never  ? Smokeless tobacco: Never  ?Vaping Use  ? Vaping Use: Never used  ?Substance and Sexual Activity  ? Alcohol use: Yes  ?  Alcohol/week: 14.0 standard drinks  ?  Types: 7 Glasses of wine, 7 Standard drinks or equivalent per week  ?  Comment: daily- wine   ?  Drug use: No  ? Sexual activity: Yes  ?  Partners: Male   ?  Birth control/protection: Post-menopausal  ?  Comment: 1st intercourse 61 yo-Fewer than 5 partners  ?Other Topics Concern  ? Not on file  ?Social History Narrative  ? Re-married April 2022 to husband Merrilee Seashore. Divorced from ex-husband John. 2 children. Daughter Sonia Baller is my patient- works in [place]. Son in [place].    ?   ? 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   ? Hobbies: former running, walks, gardens, entertains a lot, cook  ? ?Social Determinants of Health  ? ?Financial Resource Strain: Not on file  ?Food Insecurity: Not on file  ?Transportation Needs: Not on file  ?Physical Activity: Not on file  ?Stress: Not on file  ?Social Connections: Not on file  ?Intimate Partner Violence: Not on file  ? ? ?Review of Systems ? ?PHYSICAL EXAMINATION:   ? ?BP 124/78 (BP Location: Right Arm, Patient Position: Sitting, Cuff Size: Normal)   Resp 14   Ht 5' 8.5" (1.74 m)   Wt 158 lb (71.7 kg)   LMP 09/28/2008   BMI 23.67 kg/m?     ?General appearance: alert, cooperative and appears stated age ?Lungs: clear to auscultation bilaterally ?Heart: regular rate and rhythm ?Abdomen: soft, non-tender, no masses,  no organomegaly ?Inguinal nodes:  normal. ? ?Pelvic: External genitalia:  no lesions ?             Urethra:  normal appearing urethra with no masses, tenderness or lesions ?             Bartholins and Skenes: normal    ?             Vagina: normal appearing vagina with normal color and discharge, no lesions ?             Cervix: no lesions ?               ?Bimanual Exam:  Uterus:  normal size, contour, position, consistency, mobility, non-tender ?             Adnexa: no mass, fullness, tenderness ? ?Chaperone was present for exam:  yes ? ?ASSESSMENT ? ?Postmenopausal bleeding.  ?HRT patient.  ?Prior ultrasound showing EMS over 5 mm.  ?Fibroids. ?Positive HR HPV and normal pap 12/22.  ?Prior ASCUS paps.  ?Varicose veins.  ?Bipolar disorder on Lamictal.   Stable per patient. ? ?PLAN ? ?Her GYN records from outside office reviewed.  ?We discussed evaluation of postmenopausal bleeding with endometrial biopsy.  ?By ASCCP guidelines, she needs a colposcopy.  ?Both the colposcopy

## 2021-08-19 ENCOUNTER — Telehealth: Payer: Self-pay | Admitting: Obstetrics and Gynecology

## 2021-08-19 NOTE — Telephone Encounter (Signed)
Please contact patient to schedule colposcopy and endometrial biopsy on the same day.  ? ?I have placed orders.  ? ?My patient has positive HR HPV test and postmenopausal bleeding on HRT. ?

## 2021-08-20 NOTE — Telephone Encounter (Signed)
Patient has been scheduled for Colpo/Endo Biopsy for 08/31/21. ?

## 2021-08-20 NOTE — Telephone Encounter (Signed)
Encounter reviewed and closed.  

## 2021-08-20 NOTE — Telephone Encounter (Signed)
Message forwarded to appt desk to call patient and schedule colpo and endo bx same day. ?

## 2021-08-21 DIAGNOSIS — M79671 Pain in right foot: Secondary | ICD-10-CM | POA: Diagnosis not present

## 2021-08-21 DIAGNOSIS — M545 Low back pain, unspecified: Secondary | ICD-10-CM | POA: Diagnosis not present

## 2021-08-26 NOTE — Progress Notes (Signed)
?  Subjective:  ?  ? Patient ID: Dorothy Lynch, female   DOB: 1960-06-14, 61 y.o.   MRN: [id number] ? ?HPI ?Patient here today for colposcopy with pap 05-19-21 Neg:Pos HR HPV  at [facility], 01-22-19 Neg, 01-19-18 ASCUS:Neg HR HPV, 01-19-17 ASCUS:Neg HR HPV.  ? ?Patient also scheduled for EMB for postmenopausal bleeding. ? ?She had an [place] at [place] showing EMS 5.67 mm.  ? ? ?Review of Systems  ?All other systems reviewed and are negative. ? ?LMP: PMP ?Contraception: PMP ? ?   ?Objective:  ? Physical Exam ?Genitourinary: ? ? ? ?Colposcopy - cervix and vagina and Endometrial biopsy.  ?Consent for procedure.  ?Blood noted to be in the vagina at the beginning of the procedures.  ?3% acetic acid used in vagina.  ?White light and green light filter used.  ?Colposcopy satisfactory:  Yes   __x___          No    _____ ?Findings:    ?Cervix:  acetowhite change at 2:00. Friable cervix at 6:00.  ?Vagina:  no lesions.  ?Biopsies of cervix:  ECC, 6:00, 2:00.  ? ?Sterile prep of cervix with Hibiclens.  ?Pipelle passed to 7 cm x 2.   ?Tissue to pathology.  ? ?Monsel's and silver nitrate placed on cervix.  ?Minimal EBL. ?No complications.  ? ?   ?Assessment:  ?   ?Postmenopausal bleeding.  ?HRT patient.  ?Prior ultrasound showing EMS over 5 mm.  ?Fibroids. ?Positive HR HPV and normal pap 12/22.  ?Prior ASCUS paps.  ?   ?Plan:  ?   ?Follow up biopsy results. ?Final plan to follow.  ? ?   ?

## 2021-08-31 ENCOUNTER — Encounter: Payer: Self-pay | Admitting: Obstetrics and Gynecology

## 2021-08-31 ENCOUNTER — Other Ambulatory Visit (HOSPITAL_COMMUNITY)
Admission: RE | Admit: 2021-08-31 | Discharge: 2021-08-31 | Disposition: A | Discharge status: Home / Self Care | Payer: BC Managed Care – PPO | Source: Ambulatory Visit | Attending: Obstetrics and Gynecology | Admitting: Obstetrics and Gynecology

## 2021-08-31 ENCOUNTER — Ambulatory Visit: Payer: BC Managed Care – PPO | Admitting: Obstetrics and Gynecology

## 2021-08-31 VITALS — BP 110/68 | HR 63 | Ht 68.5 in | Wt 158.0 lb

## 2021-08-31 DIAGNOSIS — Z79890 Hormone replacement therapy (postmenopausal): Secondary | ICD-10-CM | POA: Insufficient documentation

## 2021-08-31 DIAGNOSIS — N950 Postmenopausal bleeding: Secondary | ICD-10-CM | POA: Insufficient documentation

## 2021-08-31 DIAGNOSIS — R9389 Abnormal findings on diagnostic imaging of other specified body structures: Secondary | ICD-10-CM

## 2021-08-31 DIAGNOSIS — R87810 Cervical high risk human papillomavirus (HPV) DNA test positive: Secondary | ICD-10-CM

## 2021-08-31 DIAGNOSIS — Z8742 Personal history of other diseases of the female genital tract: Secondary | ICD-10-CM

## 2021-08-31 DIAGNOSIS — N870 Mild cervical dysplasia: Secondary | ICD-10-CM | POA: Diagnosis not present

## 2021-08-31 DIAGNOSIS — N72 Inflammatory disease of cervix uteri: Secondary | ICD-10-CM | POA: Diagnosis not present

## 2021-08-31 NOTE — Patient Instructions (Signed)
Endometrial Biopsy ?An endometrial biopsy is a procedure to remove tissue samples from the endometrium, which is the lining of the uterus. The tissue that is removed can then be checked under a microscope for disease. ?This procedure is used to diagnose conditions such as endometrial cancer, endometrial tuberculosis, polyps, or other inflammatory conditions. This procedure may also be used to investigate uterine bleeding to determine where you are in your menstrual cycle or how your hormone levels are affecting the lining of the uterus. ?Tell a health care provider about: ?Any allergies you have. ?All medicines you are taking, including vitamins, herbs, eye drops, creams, and over-the-counter medicines. ?Any problems you or family members have had with anesthetic medicines. ?Any blood disorders you have. ?Any surgeries you have had. ?Any medical conditions you have. ?Whether you are pregnant or may be pregnant. ?What are the risks? ?Generally, this is a safe procedure. However, problems may occur, including: ?Bleeding. ?Pelvic infection. ?Puncture of the wall of the uterus with the biopsy device (rare). ?Allergic reactions to medicines. ?What happens before the procedure? ?Keep a record of your menstrual cycles as told by your health care provider. You may need to schedule your procedure for a specific time in your cycle. ?You may want to bring a sanitary pad to wear after the procedure. ?Plan to have someone take you home from the hospital or clinic. ?Ask your health care provider about: ?Changing or stopping your regular medicines. This is especially important if you are taking diabetes medicines, arthritis medicines, or blood thinners. ?Taking medicines such as aspirin and ibuprofen. These medicines can thin your blood. Do not take these medicines unless your health care provider tells you to take them. ?Taking over-the-counter medicines, vitamins, herbs, and supplements. ?What happens during the procedure? ?You  will lie on an exam table with your feet and legs supported as in a pelvic exam. ?Your health care provider will insert an instrument (speculum) into your vagina to see your cervix. ?Your cervix will be cleansed with an antiseptic solution. ?A medicine (local anesthetic) will be used to numb the cervix. ?A forceps instrument (tenaculum) will be used to hold your cervix steady for the biopsy. ?A thin, rod-like instrument (uterine sound) will be inserted through your cervix to determine the length of your uterus and the location where the biopsy sample will be removed. ?A thin, flexible tube (catheter) will be inserted through your cervix and into the uterus. The catheter will be used to collect the biopsy sample from your endometrial tissue. ?The catheter and speculum will then be removed, and the tissue sample will be sent to a lab for examination. ?The procedure may vary among health care providers and hospitals. ?What can I expect after procedure? ?You will rest in a recovery area until you are ready to go home. ?You may have mild cramping and a small amount of vaginal bleeding. This is normal. ?You may have a small amount of vaginal bleeding for a few days. This is normal. ?It is up to you to get the results of your procedure. Ask your health care provider, or the department that is doing the procedure, when your results will be ready. ?Follow these instructions at home: ?Take over-the-counter and prescription medicines only as told by your health care provider. ?Do not douche, use tampons, or have sexual intercourse until your health care provider approves. ?Return to your normal activities as told by your health care provider. Ask your health care provider what activities are safe for you. ?Follow instructions   from your health care provider about any activity restrictions, such as restrictions on strenuous exercise or heavy lifting. ?Keep all follow-up visits. This is important. ?Contact a health care  provider: ?You have heavy bleeding, or bleed for longer than 2 days after the procedure. ?You have bad smelling discharge from your vagina. ?You have a fever or chills. ?You have a burning sensation when urinating or you have difficulty urinating. ?You have severe pain in your lower abdomen. ?Get help right away if you: ?You have severe cramps in your stomach or back. ?You pass large blood clots. ?Your bleeding increases. ?You become weak or light-headed, or you faint or lose consciousness. ?Summary ?An endometrial biopsy is a procedure to remove tissue samples is taken from the endometrium, which is the lining of the uterus. ?The tissue sample that is removed will be checked under a microscope for disease. ?This procedure is used to diagnose conditions such as endometrial cancer, endometrial tuberculosis, polyps, or other inflammatory conditions. ?After the procedure, it is common to have mild cramping and a small amount of vaginal bleeding for a few days. ?Do not douche, use tampons, or have sexual intercourse until your health care provider approves. Ask your health care provider which activities are safe for you. ?This information is not intended to replace advice given to you by your health care provider. Make sure you discuss any questions you have with your health care provider. ?Document Revised: 01/28/2021 Document Reviewed: 12/10/2019 ?Elsevier Patient Education ? [place]. ?Colposcopy, Care After ?The following information offers guidance on how to care for yourself after your procedure. Your doctor may also give you more specific instructions. If you have problems or questions, contact your doctor. ?What can I expect after the procedure? ?If you did not have a sample of your tissue taken out (did not have a biopsy), you may only have some spotting of blood for a few days. You can go back to your normal activities. ?If you had a sample of your tissue taken out, it is common to have: ?Soreness and  mild pain. These may last for a few days. ?Mild bleeding or fluid (discharge) coming from your vagina. The fluid will look dark and grainy. You may have this for a few days. The fluid may be caused by a liquid that was used during your procedure. You may need to wear a sanitary pad. ?Spotting of blood for at least 48 hours after the procedure. ?Follow these instructions at home: ?Medicines ?Take over-the-counter and prescription medicines only as told by your doctor. ?Ask your doctor what over-the-counter pain medicines and prescription medicines you can start taking again. This is very important if you take blood thinners. ?Activity ?For at least 3 days, or for as long as told by your doctor, avoid: ?Douching. ?Using tampons. ?Having sex. ?Return to your normal activities as told by your doctor. Ask your doctor what activities are safe for you. ?General instructions ?Ask your doctor if you may take baths, swim, or use a hot tub. You may take showers. ?If you use birth control (contraception), keep using it. ?Keep all follow-up visits. ?Contact a doctor if: ?You have a fever or chills. ?You faint or feel light-headed. ?Get help right away if: ?You bleed a lot from your vagina. A lot of bleeding means that the bleeding soaks through a pad in less than 1 hour. ?You have clumps of blood (blood clots) coming from your vagina. ?You have signs that could mean you have an infection. This  may be fluid coming from your vagina that is: ?Different than normal. ?Yellow. ?Bad-smelling. ?You have very bad pain or cramps in your lower belly that do not get better with medicine. ?Summary ?If you did not have a sample of your tissue taken out, you may only have some spotting of blood for a few days. You can go back to your normal activities. ?If you had a sample of your tissue taken out, it is common to have mild pain for a few days and spotting for 48 hours. ?Avoid douching, using tampons, and having sex for at least 3 days after  the procedure or for as long as told. ?Get help right away if you have a lot of bleeding, very bad pain, or signs of infection. ?This information is not intended to replace advice given to you by your health c

## 2021-09-01 ENCOUNTER — Encounter: Payer: Self-pay | Admitting: Obstetrics and Gynecology

## 2021-09-01 ENCOUNTER — Other Ambulatory Visit: Payer: Self-pay | Admitting: Family Medicine

## 2021-09-03 DIAGNOSIS — M79671 Pain in right foot: Secondary | ICD-10-CM | POA: Diagnosis not present

## 2021-09-03 DIAGNOSIS — M545 Low back pain, unspecified: Secondary | ICD-10-CM | POA: Diagnosis not present

## 2021-09-03 LAB — SURGICAL PATHOLOGY

## 2021-09-04 ENCOUNTER — Other Ambulatory Visit: Payer: Self-pay | Admitting: Family Medicine

## 2021-09-14 ENCOUNTER — Other Ambulatory Visit: Payer: Self-pay | Admitting: Family Medicine

## 2021-09-14 DIAGNOSIS — M545 Low back pain, unspecified: Secondary | ICD-10-CM | POA: Diagnosis not present

## 2021-09-14 DIAGNOSIS — M79671 Pain in right foot: Secondary | ICD-10-CM | POA: Diagnosis not present

## 2021-09-30 ENCOUNTER — Other Ambulatory Visit: Payer: Self-pay | Admitting: Family Medicine

## 2021-10-01 ENCOUNTER — Ambulatory Visit: Payer: BC Managed Care – PPO | Admitting: Obstetrics and Gynecology

## 2021-10-06 ENCOUNTER — Ambulatory Visit (INDEPENDENT_AMBULATORY_CARE_PROVIDER_SITE_OTHER): Payer: BC Managed Care – PPO | Admitting: Obstetrics and Gynecology

## 2021-10-06 ENCOUNTER — Encounter: Payer: Self-pay | Admitting: Obstetrics and Gynecology

## 2021-10-06 VITALS — BP 118/78 | HR 74

## 2021-10-06 DIAGNOSIS — N870 Mild cervical dysplasia: Secondary | ICD-10-CM

## 2021-10-06 DIAGNOSIS — N950 Postmenopausal bleeding: Secondary | ICD-10-CM | POA: Diagnosis not present

## 2021-10-06 DIAGNOSIS — Z79890 Hormone replacement therapy (postmenopausal): Secondary | ICD-10-CM

## 2021-10-06 MED ORDER — ESTRADIOL 1 MG PO TABS
ORAL_TABLET | ORAL | 1 refills | Status: DC
Start: 2021-10-06 — End: 2022-06-06

## 2021-10-06 MED ORDER — PROGESTERONE 200 MG PO CAPS: 200.0000 mg | ORAL_CAPSULE | Freq: Every day | ORAL | 1 refills | Status: DC

## 2021-10-06 NOTE — Progress Notes (Signed)
GYNECOLOGY  VISIT ?  ?HPI: ?61 y.o.   Legally Separated  Caucasian  female   ?[id number] with Patient's last menstrual period was 09/28/2008.   ?here to discuss EMB biopsy results. Reports having been off progesterone for 2 days now as she ran out.   ?Her husband is present for the visit today. ? ?Patient has had ongoing postmenopausal bleeding on HRT.  ? ?Pelvic US 06/08/21 at [place]. ?Uterus 9.78 x 5.31cm ?3 fibroids;  4.09 cm, 2.67 cm - both intramural, 1.38 submucosal ?EMS 5.67 mm.  ?Right ovary normal. Left ovary not seen.  ?Normal cul de sac. ? ?Endometrial biopsy done here 08/31/21 showed polypoid features suggestive of endometrial polyp.  ? ?Colposcopy done here at the same visit on 08/31/21 for positive HR HPV noted on pap 05/19/21 done at [place].  ?Colposcopic biopsies showed:  ECC atypical squamous cells.  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  ? ?GYNECOLOGIC HISTORY: ?Patient's last menstrual period was 09/28/2008. ?Contraception:  PMP ?Menopausal hormone therapy:  Estrace and Prometrium ?Last mammogram:   10-29-20 SEE Epic.  Calcifications of the right breast a [facility].  Biopsy recommended.  ?Last pap smear:   05-19-21 Neg:Pos HR HPV  at [facility], 01-22-19 Neg, 01-19-18 ASCUS:Neg HR HPV, 01-19-17 ASCUS:Neg HR HPV.  ?       ?OB History   ? ? Gravida  ?3  ? Para  ?2  ? Term  ?   ? Preterm  ?   ? AB  ?1  ? Living  ?2  ?  ? ? SAB  ?1  ? IAB  ?   ? Ectopic  ?   ? Multiple  ?   ? Live Births  ?   ?   ?  ?  ?    ? ?Patient Active Problem List  ? Diagnosis Date Noted  ? Vitamin B12 deficiency 02/22/2019  ? Constipation 12/19/2017  ? Menopausal symptoms 10/14/2015  ? Allergic rhinitis 10/14/2015  ? Yeast infection 10/14/2015  ? Hypothyroidism 10/14/2015  ? Primary osteoarthritis of left hip 01/24/2015  ? History of squamous cell carcinoma of skin   ? Bipolar II disorder ([place]) 01/18/2011  ? Insomnia 01/18/2011  ? ? ?Past Medical History:  ?Diagnosis Date  ? Allergy   ? Anxiety   ? Arthritis   ?  ASCUS of cervix with negative high risk HPV 12/2016  ? Bipolar 2 disorder ([place])   ? Cancer [facility])   ? Chronic sinusitis   ? Chronic venous insufficiency   ? superficial---  s/p  failed attempt sclerotherapy  ? DDD (degenerative disc disease)   ? Depression   ? Endometrial polyp   ? Fibroids 06/08/2021  ? [place] at [place]- 4.09 cm, 2.67 cm, 1,38 cm  ? History of basal cell carcinoma (BCC) excision   ? 08-30-2014  left mid medial chest  ? History of squamous cell carcinoma excision   ? 2016 approx. x3 shoulder area/   left lower leg , shin 02-02-2017 approx.  ? Hypothyroidism   ? Macular degeneration   ? PMB (postmenopausal bleeding)   ? PONV (postoperative nausea and vomiting)   ? Scoliosis   ? Varicose vein of leg   ? ? ?Past Surgical History:  ?Procedure Laterality Date  ? ACHILLES TENDON SURGERY  2001  ? BREAST SURGERY  march 2011  ? breast bx-B-9, fibrocyctic changes  ? COLONOSCOPY  02-19-2010  dr Sydell Axon brodie  ? DILATATION & CURETTAGE/HYSTEROSCOPY WITH MYOSURE N/A  02/21/2017  ? Procedure: [place];  Surgeon: Anastasio Auerbach, MD;  Location: [place];  Service: Gynecology;  Laterality: N/A;  ? FACIAL COSMETIC SURGERY  2012  ? INGUINAL HERNIA REPAIR Left 1995  ? 2 hernia's on left side  ? TOTAL HIP ARTHROPLASTY Left 01/24/2015  ? Procedure: TOTAL HIP ARTHROPLASTY ANTERIOR APPROACH;  Surgeon: Dorna Leitz, MD;  Location: [place];  Service: Orthopedics;  Laterality: Left;  ? ? ?Current Outpatient Medications  ?Medication Sig Dispense Refill  ? AMBULATORY NON FORMULARY MEDICATION Medication Name: sero-bital 1 tablet daily    ? Biotin 1 MG CAPS SMARTSIG:1 By Mouth    ? Cholecalciferol (VITAMIN D3) 2000 units TABS Take 1 tablet by mouth daily.    ? escitalopram (LEXAPRO) 10 MG tablet TAKE 1 TABLET BY MOUTH EVERY DAY 30 tablet 0  ? estradiol (ESTRACE) 1 MG tablet Take 2 tablets (2 mg total) by mouth 2 (two) times daily. 124 tablet 11  ? fexofenadine (ALLERGY RELIEF)  180 MG tablet TAKE 1 TABLET BY MOUTH EVERY DAY PRN 30 tablet 6  ? Glucosamine HCl (GLUCOSAMINE PO) Take 1 tablet by mouth daily.    ? lamoTRIgine (LAMICTAL) 100 MG tablet TAKE 1 TABLET BY MOUTH 2 TIMES DAILY 60 tablet 0  ? levothyroxine (SYNTHROID) 75 MCG tablet TAKE 1 TABLET BY MOUTH EVERY DAY BEFORE BREAKFAST 90 tablet 0  ? methocarbamol (ROBAXIN) 750 MG tablet Take 750 mg by mouth every 8 (eight) hours.    ? NONFORMULARY OR COMPOUNDED ITEM Boric acid suppository 600 mg once vaginally twice weekly 30 each 2  ? Omega-3 Fatty Acids (FISH OIL PO) Take 1 capsule by mouth daily.     ? OVER THE COUNTER MEDICATION Sera Vital ?Takes 4 tablets qhs    ? Polyethyl Glycol-Propyl Glycol (SYSTANE OP) Place 1 drop into both eyes daily as needed (for dry eyes).    ? progesterone (PROMETRIUM) 200 MG capsule Take 200 mg by mouth daily.    ? RETIN-A 0.05 % cream Apply 1 application. topically at bedtime.    ? venlafaxine XR (EFFEXOR-XR) 75 MG 24 hr capsule TAKE 1 CAPSULE BY MOUTH EVERY DAY WITH BREAKFAST 30 capsule 0  ? zolpidem (AMBIEN) 10 MG tablet TAKE 1 TABLET BY MOUTH EVERY EVENING FOR SLEEP 30 tablet 5  ? ?No current facility-administered medications for this visit.  ?  ? ?ALLERGIES: Penicillins and Sulfa antibiotics ? ?Family History  ?Problem Relation Age of Onset  ? Thyroid disease Mother   ? Breast cancer Mother 25  ?     39 in 2017, survived breast cancer  ? Heart failure Mother   ? Diabetes Father   ? Hypertension Father   ? Stroke Father 36  ?     died at 49  ? CVA Brother   ?     32s smoking  ? Alcohol abuse Brother   ?     recovering  ? Cancer Maternal Grandmother 48  ?     lung, smoker  ? Stroke Maternal Grandfather   ? Heart disease Paternal Grandmother   ?     mi  ? Colon cancer Neg Hx   ? Rectal cancer Neg Hx   ? Stomach cancer Neg Hx   ? ? ?Social History  ? ?Socioeconomic History  ? Marital status: Legally Separated  ?  Spouse name: Not on file  ? Number of children: Not on file  ? Years of education: Not on  file  ?  Highest education level: Not on file  ?Occupational History  ? Not on file  ?Tobacco Use  ? Smoking status: Never  ? Smokeless tobacco: Never  ?Vaping Use  ? Vaping Use: Never used  ?Substance and Sexual Activity  ? Alcohol use: Yes  ?  Alcohol/week: 14.0 standard drinks  ?  Types: 7 Glasses of wine, 7 Standard drinks or equivalent per week  ?  Comment: daily- wine   ? Drug use: No  ? Sexual activity: Yes  ?  Partners: Male  ?  Birth control/protection: Post-menopausal  ?  Comment: 1st intercourse 61 yo-Fewer than 5 partners  ?Other Topics Concern  ? Not on file  ?Social History Narrative  ? Re-married April 2022 to husband Merrilee Seashore. Divorced from ex-husband John. 2 children. Daughter Sonia Baller is my patient- works in [place]. Son in [place].    ?   ? 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   ? Hobbies: former running, walks, gardens, entertains a lot, cook  ? ?Social Determinants of Health  ? ?Financial Resource Strain: Not on file  ?Food Insecurity: Not on file  ?Transportation Needs: Not on file  ?Physical Activity: Not on file  ?Stress: Not on file  ?Social Connections: Not on file  ?Intimate Partner Violence: Not on file  ? ? ?Review of Systems  ?All other systems reviewed and are negative. ? ?PHYSICAL EXAMINATION:   ? ?BP 118/78   Pulse 74   LMP 09/28/2008   SpO2 97%     ?General appearance: alert, cooperative and appears stated age ? ?ASSESSMENT ? ?Postmenopausal bleeding on HRT.  ?EMB suggestive of endometrial polyp.  ?LGSIL of cervix. ?Abnormal mammogram report noted in Epic after visit completed. ? ?PLAN ? ?We discussed her EMB results and her colposcopy results.  ?I am recommending hysteroscopy with Myosure resection of endometrial polyp, dilation and curettage. ?Risks, benefits, and alternatives reviewed. ?Risks include but are not limited to bleeding, infection, damage to surrounding organs including uterine perforation requiring  hospitalization and laparoscopy, pulmonary edema, reaction to anesthesia, DVT, PE, death, need for further treatment. ?Surgical expectations and recovery discussed.  ?Patient wishes to proceed. ? ?I did refill her Pro

## 2021-10-07 ENCOUNTER — Telehealth: Payer: Self-pay | Admitting: Obstetrics and Gynecology

## 2021-10-07 NOTE — Telephone Encounter (Signed)
Please precert and schedule hysteroscopy with Myosure resection of endometrial polyp, dilation and curettage at [facility].  ? ?My patient has post menopausal bleeding on hormone therapy.  ? ?Time needed 1 hour.  ? ?Preop needed.  ? ?Please also facilitate scheduling a diagnostic bilateral mammogram and right breast ultrasound at [facility]. ?She has suspicious calcifications of the right breast noted in June, 2022 prior to her establishing care at this office.  ?She apparently declined the breast biopsy, but I do not know if this was accomplished elsewhere.  ?If it was done elsewhere, please get the reports.  ? ? ?

## 2021-10-08 NOTE — Telephone Encounter (Signed)
Spoke with patient. Reviewed surgery dates. Patient request to proceed with surgery on 10/27/21.  Advised patient I will forward to business office for return call.  ? ?Reviewed f/u imaging per Dr. Quincy Simmonds, patient states she does not plan to schedule f/u, declines assistance with scheduling.  ?Advised patient I will update Dr. Quincy Simmonds and return call if any additional recommendations. Patient verbalizes understanding.  ? ?Dr. Quincy Simmonds -please review.  ?

## 2021-10-08 NOTE — Telephone Encounter (Signed)
I recommend she consider a second opinion with the [facility] and do diagnostic imaging there.  ? ?If she chooses not do follow up breast imaging or the biopsy if recommended, I will not be able to give her any further refills of her hormone therapy.   ? ?I just want to know that she is healthy and safe, and I do not want to overlook the possibility that she has a breast cancer.  ? ?I am happy to have her come back for an office visit to discuss this personally with her. ?

## 2021-10-09 NOTE — Telephone Encounter (Signed)
Spoke with patient, she is unable to talk right now, will return call later today.  ? ?Surgery request sent.  ?

## 2021-10-12 ENCOUNTER — Other Ambulatory Visit: Payer: Self-pay | Admitting: Family Medicine

## 2021-10-13 NOTE — Telephone Encounter (Signed)
Spoke with patient. Surgery date request confirmed.  ?Advised surgery is scheduled for 10/27/21, [place] at 18.  ?Surgery instruction sheet and hospital brochure reviewed, printed copy will be mailed.  ? ?Advised as seen below per Dr. Quincy Simmonds regarding breast imaging. Patient states she will contact [facility] and her insurance company for benefits and then return call. Patient declines assistance with scheduling at [facility] at this time, will call if needed.  ? ?Patient verbalizes understanding and is agreeable.  ? ?Routing to [employer].  ? ? ?

## 2021-10-13 NOTE — Telephone Encounter (Signed)
Spoke with patient regarding surgery benefits. Patient acknowledges understanding of information presented. Patient is aware that benefits presented are professional benefits only. Patient is aware the hospital will call with facility benefits. See account note. ? ?Encounter closed. ?

## 2021-10-14 ENCOUNTER — Encounter: Payer: BC Managed Care – PPO | Admitting: Obstetrics and Gynecology

## 2021-10-18 NOTE — H&P (Signed)
Nunzio Cobbs, MD Physician Gynecology Progress Notes    Signed Encounter Date:  10/06/2021   Signed        GYNECOLOGY  VISIT   HPI: 61 y.o.   Legally Separated  Caucasian  female   [id number] with Patient's last menstrual period was 09/28/2008.   here to discuss EMB biopsy results. Reports having been off progesterone for 2 days now as she ran out.   Her husband is present for the visit today.   Patient has had ongoing postmenopausal bleeding on HRT.    Pelvic US 06/08/21 at [place]. Uterus 9.78 x 5.31cm 3 fibroids;  4.09 cm, 2.67 cm - both intramural, 1.38 submucosal EMS 5.67 mm.  Right ovary normal. Left ovary not seen.  Normal cul de sac.   Endometrial biopsy done here 08/31/21 showed polypoid features suggestive of endometrial polyp.    Colposcopy done here at the same visit on 08/31/21 for positive HR HPV noted on pap 05/19/21 done at [place].  Colposcopic biopsies showed:  ECC atypical squamous cells.  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    GYNECOLOGIC HISTORY: Patient's last menstrual period was 09/28/2008. Contraception:  PMP Menopausal hormone therapy:  Estrace and Prometrium Last mammogram:   10-29-20 SEE Epic.  Calcifications of the right breast a [facility].  Biopsy recommended.  Last pap smear:   05-19-21 Neg:Pos HR HPV  at [facility], 01-22-19 Neg, 01-19-18 ASCUS:Neg HR HPV, 01-19-17 ASCUS:Neg HR HPV.         OB History       Gravida  3   Para  2   Term      Preterm      AB  1   Living  2        SAB  1   IAB      Ectopic      Multiple      Live Births                        Patient Active Problem List    Diagnosis Date Noted   Vitamin B12 deficiency 02/22/2019   Constipation 12/19/2017   Menopausal symptoms 10/14/2015   Allergic rhinitis 10/14/2015   Yeast infection 10/14/2015   Hypothyroidism 10/14/2015   Primary osteoarthritis of left hip 01/24/2015   History of squamous cell carcinoma of skin      Bipolar II disorder ([place]) 01/18/2011   Insomnia 01/18/2011          Past Medical History:  Diagnosis Date   Allergy     Anxiety     Arthritis     ASCUS of cervix with negative high risk HPV 12/2016   Bipolar 2 disorder ([facility])     Cancer ([facility])     Chronic sinusitis     Chronic venous insufficiency      superficial---  s/p  failed attempt sclerotherapy   DDD (degenerative disc disease)     Depression     Endometrial polyp     Fibroids 06/08/2021    [place] at [facility]- 4.09 cm, 2.67 cm, 1,38 cm   History of basal cell carcinoma (BCC) excision      08-30-2014  left mid medial chest   History of squamous cell carcinoma excision      2016 approx. x3 shoulder area/   left lower leg , shin 02-02-2017 approx.   Hypothyroidism     Macular degeneration  PMB (postmenopausal bleeding)     PONV (postoperative nausea and vomiting)     Scoliosis     Varicose vein of leg             Past Surgical History:  Procedure Laterality Date   ACHILLES TENDON SURGERY   2001   BREAST SURGERY   march 2011    breast bx-B-9, fibrocyctic changes   COLONOSCOPY   02-19-2010  dr Sydell Axon brodie   DILATATION & CURETTAGE/HYSTEROSCOPY WITH MYOSURE N/A 02/21/2017    Procedure: DILATATION & CURETTAGE/HYSTEROSCOPY WITH MYOSURE;  Surgeon: Anastasio Auerbach, MD;  Location: [place];  Service: Gynecology;  Laterality: N/A;   FACIAL COSMETIC SURGERY   2012   INGUINAL HERNIA REPAIR Left 1995    2 hernia's on left side   TOTAL HIP ARTHROPLASTY Left 01/24/2015    Procedure: TOTAL HIP ARTHROPLASTY ANTERIOR APPROACH;  Surgeon: Dorna Leitz, MD;  Location: [place];  Service: Orthopedics;  Laterality: Left;            Current Outpatient Medications  Medication Sig Dispense Refill   AMBULATORY NON FORMULARY MEDICATION Medication Name: sero-bital 1 tablet daily       Biotin 1 MG CAPS SMARTSIG:1 By Mouth       Cholecalciferol (VITAMIN D3) 2000 units TABS Take 1 tablet by mouth daily.       escitalopram  (LEXAPRO) 10 MG tablet TAKE 1 TABLET BY MOUTH EVERY DAY 30 tablet 0   estradiol (ESTRACE) 1 MG tablet Take 2 tablets (2 mg total) by mouth 2 (two) times daily. 124 tablet 11   fexofenadine (ALLERGY RELIEF) 180 MG tablet TAKE 1 TABLET BY MOUTH EVERY DAY PRN 30 tablet 6   Glucosamine HCl (GLUCOSAMINE PO) Take 1 tablet by mouth daily.       lamoTRIgine (LAMICTAL) 100 MG tablet TAKE 1 TABLET BY MOUTH 2 TIMES DAILY 60 tablet 0   levothyroxine (SYNTHROID) 75 MCG tablet TAKE 1 TABLET BY MOUTH EVERY DAY BEFORE BREAKFAST 90 tablet 0   methocarbamol (ROBAXIN) 750 MG tablet Take 750 mg by mouth every 8 (eight) hours.       NONFORMULARY OR COMPOUNDED ITEM Boric acid suppository 600 mg once vaginally twice weekly 30 each 2   Omega-3 Fatty Acids (FISH OIL PO) Take 1 capsule by mouth daily.        OVER THE COUNTER MEDICATION Sera Vital Takes 4 tablets qhs       Polyethyl Glycol-Propyl Glycol (SYSTANE OP) Place 1 drop into both eyes daily as needed (for dry eyes).       progesterone (PROMETRIUM) 200 MG capsule Take 200 mg by mouth daily.       RETIN-A 0.05 % cream Apply 1 application. topically at bedtime.       venlafaxine XR (EFFEXOR-XR) 75 MG 24 hr capsule TAKE 1 CAPSULE BY MOUTH EVERY DAY WITH BREAKFAST 30 capsule 0   zolpidem (AMBIEN) 10 MG tablet TAKE 1 TABLET BY MOUTH EVERY EVENING FOR SLEEP 30 tablet 5    No current facility-administered medications for this visit.      ALLERGIES: Penicillins and Sulfa antibiotics        Family History  Problem Relation Age of Onset   Thyroid disease Mother     Breast cancer Mother 58        84 in 2017, survived breast cancer   Heart failure Mother     Diabetes Father     Hypertension Father     Stroke Father 59  died at 72   CVA Brother          56s smoking   Alcohol abuse Brother          recovering   Cancer Maternal Grandmother 63        lung, smoker   Stroke Maternal Grandfather     Heart disease Paternal Grandmother          mi   Colon  cancer Neg Hx     Rectal cancer Neg Hx     Stomach cancer Neg Hx        Social History         Socioeconomic History   Marital status: Legally Separated      Spouse name: Not on file   Number of children: Not on file   Years of education: Not on file   Highest education level: Not on file  Occupational History   Not on file  Tobacco Use   Smoking status: Never   Smokeless tobacco: Never  Vaping Use   Vaping Use: Never used  Substance and Sexual Activity   Alcohol use: Yes      Alcohol/week: 14.0 standard drinks      Types: 7 Glasses of wine, 7 Standard drinks or equivalent per week      Comment: daily- wine    Drug use: No   Sexual activity: Yes      Partners: Male      Birth control/protection: Post-menopausal      Comment: 1st intercourse 61 yo-Fewer than 5 partners  Other Topics Concern   Not on file  Social History Narrative    Re-married April 2022 to husband Merrilee Seashore. Divorced from ex-husband John. 2 children. Daughter Sonia Baller is my patient- works in [place]. Son in [place].           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   PHYSICAL EXAMINATION:     BP 118/78   Pulse 74   LMP 09/28/2008   SpO2 97%     General appearance: alert, cooperative and appears stated age   ASSESSMENT   Postmenopausal bleeding on HRT.  EMB suggestive of endometrial polyp.  LGSIL of cervix. Abnormal mammogram report noted in Epic after visit completed.   PLAN   We discussed her EMB results and her colposcopy  results.  I am recommending hysteroscopy with Myosure resection of endometrial polyp, dilation and curettage. Risks, benefits, and alternatives reviewed. Risks include but are not limited to bleeding, infection, damage to surrounding organs including uterine perforation requiring hospitalization and laparoscopy, pulmonary edema, reaction to anesthesia, DVT, PE, death, need for further treatment. Surgical expectations and recovery discussed.  Patient wishes to proceed.   I did refill her Prometrium 200 mg at hs.  #30 and estradiol 1 mg daily.  #30, RF one - for each.   She will be contacted about the need for follow up imaging of the breast and breast biopsy.  An After Visit Summary was printed and given to the patient.   34 min  total time was spent for this patient encounter, including preparation, face-to-face counseling with the patient, coordination of care, and documentation of the encounter.            Electronically signed by Nunzio Cobbs, MD at 10/07/2021  7:00 PM

## 2021-10-19 ENCOUNTER — Ambulatory Visit: Payer: BC Managed Care – PPO | Admitting: Family Medicine

## 2021-10-19 ENCOUNTER — Encounter: Payer: Self-pay | Admitting: Family Medicine

## 2021-10-19 VITALS — BP 120/70 | HR 54 | Temp 97.3°F | Ht 68.5 in | Wt 158.0 lb

## 2021-10-19 DIAGNOSIS — F3181 Bipolar II disorder: Secondary | ICD-10-CM

## 2021-10-19 DIAGNOSIS — E039 Hypothyroidism, unspecified: Secondary | ICD-10-CM | POA: Diagnosis not present

## 2021-10-19 DIAGNOSIS — R5383 Other fatigue: Secondary | ICD-10-CM

## 2021-10-19 MED ORDER — LORAZEPAM 0.5 MG PO TABS: 0.5000 mg | ORAL_TABLET | Freq: Two times a day (BID) | ORAL | 1 refills | Status: DC | PRN

## 2021-10-19 NOTE — Patient Instructions (Addendum)
Bipolar II with worsening depression -will change Lamictal from 200 mg in evening to '100mg'$  twice a day to see if provides more balance -she will reestablish with Florinda Marker- her prior therapist - discussed possible psychiatry referral but we are also taking into view recent losses/grieving  Please stop by lab before you go If you have mychart- we will send your results within 3 business days of [place] receiving them.  If you do not have mychart- we will call you about results within 5 business days of [place] receiving them.  *please also note that you will see labs on mychart as soon as they post. I will later go in and write notes on them- will say "notes from Dr. Yong Channel"   Recommended follow up: Return in about 1 month (around 11/19/2021) for followup or sooner if needed.Schedule b4 you leave.

## 2021-10-19 NOTE — Progress Notes (Signed)
Phone [phone] In person visit   Subjective:   Dorothy Lynch is a 61 y.o. year old very pleasant female patient who presents for/with See problem oriented charting Chief Complaint  Patient presents with   Depression    Pt c/o acute depression that she noticed about 3 weeks ago.   Past Medical History-  Patient Active Problem List   Diagnosis Date Noted   Bipolar II disorder ([place]) 01/18/2011    Priority: High   Menopausal symptoms 10/14/2015    Priority: Medium    Hypothyroidism 10/14/2015    Priority: Medium    Insomnia 01/18/2011    Priority: Medium    Constipation 12/19/2017    Priority: Low   Allergic rhinitis 10/14/2015    Priority: Low   Yeast infection 10/14/2015    Priority: Low   Primary osteoarthritis of left hip 01/24/2015    Priority: Low   History of squamous cell carcinoma of skin     Priority: Low   Vitamin B12 deficiency 02/22/2019    Medications- reviewed and updated Current Outpatient Medications  Medication Sig Dispense Refill   AMBULATORY NON FORMULARY MEDICATION Medication Name: sero-bital 1 tablet daily     Biotin 1 MG CAPS SMARTSIG:1 By Mouth     Cholecalciferol (VITAMIN D3) 2000 units TABS Take 1 tablet by mouth daily.     escitalopram (LEXAPRO) 10 MG tablet TAKE 1 TABLET BY MOUTH EVERY DAY 30 tablet 0   estradiol (ESTRACE) 1 MG tablet Take 1 tablet (1 mg) by mouth daily. 30 tablet 1   fexofenadine (ALLERGY RELIEF) 180 MG tablet TAKE 1 TABLET BY MOUTH EVERY DAY PRN 30 tablet 6   Glucosamine HCl (GLUCOSAMINE PO) Take 1 tablet by mouth daily.     lamoTRIgine (LAMICTAL) 100 MG tablet TAKE 1 TABLET BY MOUTH 2 TIMES DAILY 60 tablet 0   levothyroxine (SYNTHROID) 75 MCG tablet TAKE 1 TABLET BY MOUTH EVERY DAY BEFORE BREAKFAST 90 tablet 0   methocarbamol (ROBAXIN) 750 MG tablet Take 750 mg by mouth every 8 (eight) hours.     NONFORMULARY OR COMPOUNDED ITEM Boric acid suppository 600 mg once vaginally twice weekly 30 each 2   Omega-3 Fatty Acids (FISH  OIL PO) Take 1 capsule by mouth daily.      OVER THE COUNTER MEDICATION Sera Vital Takes 4 tablets qhs     Polyethyl Glycol-Propyl Glycol (SYSTANE OP) Place 1 drop into both eyes daily as needed (for dry eyes).     progesterone (PROMETRIUM) 200 MG capsule Take 1 capsule (200 mg total) by mouth daily. 30 capsule 1   RETIN-A 0.05 % cream Apply 1 application. topically at bedtime.     venlafaxine XR (EFFEXOR-XR) 75 MG 24 hr capsule TAKE 1 CAPSULE BY MOUTH EVERY DAY WITH BREAKFAST 30 capsule 0   zolpidem (AMBIEN) 10 MG tablet TAKE 1 TABLET BY MOUTH EVERY EVENING FOR SLEEP 30 tablet 5   No current facility-administered medications for this visit.     Objective:  BP 120/70   Pulse (!) 54   Temp (!) 97.3 F (36.3 C)   Ht 5' 8.5" (1.74 m)   Wt 158 lb (71.7 kg)   LMP 09/28/2008   SpO2 97%   BMI 23.67 kg/m  Gen: NAD, resting comfortably CV: RRR no murmurs rubs or gallops Lungs: CTAB no crackles, wheeze, rhonchi Ext: no edema Skin: warm, dry    Assessment and Plan   # Depression in patient with bipolar 2 #Situational anxiety- mom is in active phase  of dying- mom on hospice at [facility] S: Medication:Escitalopram 10 mg daily, Lamictal 100 mg twice daily listed previously but has only been taking once a day (all at once) as well as venlafaxine 75 mg standard release daily -Also takes Ambien 10 mg in the p.m. for sleep -In the past has taken lorazepam 0.5 mg for more intense stressors  Has been seen during physical with PHQ-9 up to 9 in October with a plan to update me in 2 months on how she was doing with increasing therapy meetings with her church minister-had considered escalating to formal therapist- had missed visits in recent months . Has not seen Florinda Marker her prior therapist recently  Has felt more depressed in last 3 weeks since mom went on hospice care which is understandable. Then her cat died 5 days ago. Felt like things were worse before. Ongoing financial strain/lawsuit  issues. Work is supportive but is having focus issues.     10/19/2021    3:15 PM 03/17/2021    4:34 PM 09/03/2020   11:11 AM  Depression screen PHQ 2/9  Decreased Interest 2 1 0  Down, Depressed, Hopeless 3 1 0  PHQ - 2 Score 5 2 0  Altered sleeping '3 2 2  '$ Tired, decreased energy 2 1 0  Change in appetite 0 1 0  Feeling bad or failure about yourself  3 1 0  Trouble concentrating '3 2 2  '$ Moving slowly or fidgety/restless 0 0 1  Suicidal thoughts 0 0 0  PHQ-9 Score '16 9 5  '$ Difficult doing work/chores Very difficult Somewhat difficult Somewhat difficult  A/P: Bipolar II with worsening depression -will change Lamictal from 200 mg in evening to '100mg'$  twice a day to see if provides more balance -she will reestablish with Florinda Marker- her prior therapist - discussed possible psychiatry referral but we are also taking into view recent losses/grieving -short term trial lorazepam - gave precautions- driving, alcohol - if this is too strong could perhaps trial low dose buspirone but with her height/weight and already on lexapro and venlafaxine did not start there -call 911 or [phone] if thoughts of self harm- we reviewed together  #hypothyroidism S: compliant On thyroid medication-levothyroxine 75 mcg  Lab Results  Component Value Date   TSH 4.95 03/17/2021   A/P:with recent worsening depression update labs to make sure this is stable- for now continue current meds- presume stable   Recommended follow up: Return in about 1 month (around 11/19/2021) for followup or sooner if needed.Schedule b4 you leave. Future Appointments  Date Time Provider [place]  11/09/2021  3:00 PM Nunzio Cobbs, MD [facility] None    Lab/Order associations:   ICD-10-CM   1. Bipolar II disorder ([place])  F31.81 Comprehensive metabolic panel    CBC with Differential/Platelet    CANCELED: Ambulatory referral to Psychiatry    2. Fatigue, unspecified type  R53.83 TSH    Comprehensive metabolic panel     CBC with Differential/Platelet    3. Hypothyroidism, unspecified type  E03.9       No orders of the defined types were placed in this encounter.   Return precautions advised.  Garret Reddish, MD

## 2021-10-20 LAB — COMPREHENSIVE METABOLIC PANEL
ALT: 16 U/L (ref 0–35)
AST: 23 U/L (ref 0–37)
Albumin: 4 g/dL (ref 3.5–5.2)
Alkaline Phosphatase: 64 U/L (ref 39–117)
BUN: 16 mg/dL (ref 6–23)
CO2: 26 mEq/L (ref 19–32)
Calcium: 9.4 mg/dL (ref 8.4–10.5)
Chloride: 101 mEq/L (ref 96–112)
Creatinine, Ser: 0.84 mg/dL (ref 0.40–1.20)
GFR: 74.5 mL/min (ref >60.00)
Glucose, Bld: 83 mg/dL (ref 70–99)
Potassium: 3.9 mEq/L (ref 3.5–5.1)
Sodium: 138 mEq/L (ref 135–145)
Total Bilirubin: 0.3 mg/dL (ref 0.2–1.2)
Total Protein: 6.7 g/dL (ref 6.0–8.3)

## 2021-10-20 LAB — CBC WITH DIFFERENTIAL/PLATELET
Basophils Absolute: 0.1 10*3/uL (ref 0.0–0.1)
Basophils Relative: 0.7 % (ref 0.0–3.0)
Eosinophils Absolute: 0.3 10*3/uL (ref 0.0–0.7)
Eosinophils Relative: 4.2 % (ref 0.0–5.0)
HCT: 36.8 % (ref 36.0–46.0)
Hemoglobin: 12.4 g/dL (ref 12.0–15.0)
Lymphocytes Relative: 32.1 % (ref 12.0–46.0)
Lymphs Abs: 2.3 10*3/uL (ref 0.7–4.0)
MCHC: 33.6 g/dL (ref 30.0–36.0)
MCV: 95.8 fl (ref 78.0–100.0)
Monocytes Absolute: 0.8 10*3/uL (ref 0.1–1.0)
Monocytes Relative: 10.9 % (ref 3.0–12.0)
Neutro Abs: 3.8 10*3/uL (ref 1.4–7.7)
Neutrophils Relative %: 52.1 % (ref 43.0–77.0)
Platelets: 261 10*3/uL (ref 150.0–400.0)
RBC: 3.84 Mil/uL — ABNORMAL LOW (ref 3.87–5.11)
RDW: 13.4 % (ref 11.5–15.5)
WBC: 7.3 10*3/uL (ref 4.0–10.5)

## 2021-10-20 LAB — TSH: TSH: 3.2 u[IU]/mL (ref 0.35–5.50)

## 2021-10-21 ENCOUNTER — Encounter: Payer: Self-pay | Admitting: Family Medicine

## 2021-10-21 DIAGNOSIS — R928 Other abnormal and inconclusive findings on diagnostic imaging of breast: Secondary | ICD-10-CM | POA: Diagnosis not present

## 2021-10-21 DIAGNOSIS — R922 Inconclusive mammogram: Secondary | ICD-10-CM | POA: Diagnosis not present

## 2021-10-21 LAB — HM MAMMOGRAPHY

## 2021-10-22 ENCOUNTER — Other Ambulatory Visit: Payer: Self-pay

## 2021-10-22 ENCOUNTER — Encounter (HOSPITAL_BASED_OUTPATIENT_CLINIC_OR_DEPARTMENT_OTHER): Payer: Self-pay | Admitting: Obstetrics and Gynecology

## 2021-10-22 NOTE — Progress Notes (Signed)
Spoke w/ via phone for pre-op interview: patient Lab needs dos: ask about discontinuing CBC and BMP d/t CBC and CMP done 10/19/21 Lab results: CBC and CMP 10/19/21 COVID test: patient states asymptomatic no test needed. Arrive at 0530 10/27/21 NPO after MN except clear liquids.Clear liquids from MN until 0430 Med rec completed. Medications to take morning of surgery: Lexapro, Lamictal, Synthroid, Allegra, Effexor, Estrace, and progesterone; Hold vitamins and supplements 5 days prior to sx Diabetic medication: NA Patient instructed no nail polish to be worn day of surgery. Patient instructed to bring photo id and insurance card day of surgery. Patient aware to have Driver (ride ) / caregiver for 24 hours after surgery. (Husband, Nikk to drive) Patient Special Instructions: NA Pre-Op special Istructions: NA Patient verbalized understanding of instructions that were given at this phone interview. Patient denies shortness of breath, chest pain, fever, cough at this phone interview.

## 2021-10-23 ENCOUNTER — Inpatient Hospital Stay (HOSPITAL_COMMUNITY): Admission: RE | Admit: 2021-10-23 | Payer: BC Managed Care – PPO | Source: Ambulatory Visit

## 2021-10-26 NOTE — Anesthesia Preprocedure Evaluation (Signed)
Anesthesia Evaluation  Patient identified by MRN, date of birth, ID band Patient awake    Reviewed: Allergy & Precautions, NPO status   History of Anesthesia Complications (+) PONV and history of anesthetic complications  Airway Mallampati: II  TM Distance: >3 FB     Dental   Pulmonary    breath sounds clear to auscultation       Cardiovascular negative cardio ROS   Rhythm:Regular Rate:Normal     Neuro/Psych PSYCHIATRIC DISORDERS    GI/Hepatic negative GI ROS, Neg liver ROS,   Endo/Other  Hypothyroidism   Renal/GU negative Renal ROS     Musculoskeletal   Abdominal   Peds  Hematology   Anesthesia Other Findings   Reproductive/Obstetrics                            Anesthesia Physical Anesthesia Plan  ASA: 3  Anesthesia Plan: General   Post-op Pain Management:    Induction: Intravenous  PONV Risk Score and Plan: Ondansetron, Dexamethasone and Midazolam  Airway Management Planned: LMA  Additional Equipment:   Intra-op Plan:   Post-operative Plan: Extubation in OR  Informed Consent: I have reviewed the patients History and Physical, chart, labs and discussed the procedure including the risks, benefits and alternatives for the proposed anesthesia with the patient or authorized representative who has indicated his/her understanding and acceptance.     Dental advisory given  Plan Discussed with: Anesthesiologist and CRNA  Anesthesia Plan Comments:        Anesthesia Quick Evaluation

## 2021-10-27 ENCOUNTER — Ambulatory Visit (HOSPITAL_BASED_OUTPATIENT_CLINIC_OR_DEPARTMENT_OTHER): Payer: BC Managed Care – PPO | Admitting: Anesthesiology

## 2021-10-27 ENCOUNTER — Encounter (HOSPITAL_BASED_OUTPATIENT_CLINIC_OR_DEPARTMENT_OTHER)
Admission: RE | Disposition: A | Discharge status: Home / Self Care | Payer: Self-pay | Source: Home / Self Care | Attending: Obstetrics and Gynecology

## 2021-10-27 ENCOUNTER — Ambulatory Visit (HOSPITAL_BASED_OUTPATIENT_CLINIC_OR_DEPARTMENT_OTHER)
Admission: RE | Admit: 2021-10-27 | Discharge: 2021-10-27 | Disposition: A | Discharge status: Home / Self Care | Payer: BC Managed Care – PPO | Attending: Obstetrics and Gynecology | Admitting: Obstetrics and Gynecology

## 2021-10-27 ENCOUNTER — Other Ambulatory Visit: Payer: Self-pay

## 2021-10-27 ENCOUNTER — Encounter: Payer: Self-pay | Admitting: Family Medicine

## 2021-10-27 ENCOUNTER — Encounter (HOSPITAL_BASED_OUTPATIENT_CLINIC_OR_DEPARTMENT_OTHER): Payer: Self-pay | Admitting: Obstetrics and Gynecology

## 2021-10-27 DIAGNOSIS — Z01818 Encounter for other preprocedural examination: Secondary | ICD-10-CM

## 2021-10-27 DIAGNOSIS — E039 Hypothyroidism, unspecified: Secondary | ICD-10-CM | POA: Diagnosis not present

## 2021-10-27 DIAGNOSIS — N856 Intrauterine synechiae: Secondary | ICD-10-CM | POA: Diagnosis not present

## 2021-10-27 DIAGNOSIS — N950 Postmenopausal bleeding: Secondary | ICD-10-CM | POA: Insufficient documentation

## 2021-10-27 DIAGNOSIS — D250 Submucous leiomyoma of uterus: Secondary | ICD-10-CM | POA: Diagnosis not present

## 2021-10-27 DIAGNOSIS — Z79890 Hormone replacement therapy (postmenopausal): Secondary | ICD-10-CM | POA: Diagnosis not present

## 2021-10-27 DIAGNOSIS — N840 Polyp of corpus uteri: Secondary | ICD-10-CM | POA: Insufficient documentation

## 2021-10-27 DIAGNOSIS — F99 Mental disorder, not otherwise specified: Secondary | ICD-10-CM | POA: Diagnosis not present

## 2021-10-27 HISTORY — PX: DILATATION & CURETTAGE/HYSTEROSCOPY WITH MYOSURE: SHX6511

## 2021-10-27 SURGERY — DILATATION & CURETTAGE/HYSTEROSCOPY WITH MYOSURE
Anesthesia: General

## 2021-10-27 MED ORDER — KETOROLAC TROMETHAMINE 30 MG/ML IJ SOLN
INTRAMUSCULAR | Status: DC | PRN
  Administered 2021-10-27: 30 mg via INTRAVENOUS

## 2021-10-27 MED ORDER — ONDANSETRON HCL 4 MG/2ML IJ SOLN
INTRAMUSCULAR | Status: AC
  Filled 2021-10-27: qty 2

## 2021-10-27 MED ORDER — LACTATED RINGERS IV SOLN: INTRAVENOUS | Status: DC

## 2021-10-27 MED ORDER — DEXAMETHASONE SODIUM PHOSPHATE 10 MG/ML IJ SOLN
INTRAMUSCULAR | Status: AC
  Filled 2021-10-27: qty 1

## 2021-10-27 MED ORDER — PROPOFOL 10 MG/ML IV BOLUS
INTRAVENOUS | Status: DC | PRN
  Administered 2021-10-27: 150 mg via INTRAVENOUS

## 2021-10-27 MED ORDER — ARTIFICIAL TEARS OPHTHALMIC OINT
TOPICAL_OINTMENT | OPHTHALMIC | Status: AC
  Filled 2021-10-27: qty 3.5

## 2021-10-27 MED ORDER — FENTANYL CITRATE (PF) 100 MCG/2ML IJ SOLN
INTRAMUSCULAR | Status: AC
  Filled 2021-10-27: qty 2

## 2021-10-27 MED ORDER — MIDAZOLAM HCL 2 MG/2ML IJ SOLN
INTRAMUSCULAR | Status: AC
  Filled 2021-10-27: qty 2

## 2021-10-27 MED ORDER — ACETAMINOPHEN 500 MG PO TABS
1000.0000 mg | ORAL_TABLET | ORAL | Status: AC
  Administered 2021-10-27: 1000 mg via ORAL

## 2021-10-27 MED ORDER — PROPOFOL 10 MG/ML IV BOLUS
INTRAVENOUS | Status: AC
  Filled 2021-10-27: qty 20

## 2021-10-27 MED ORDER — LACTATED RINGERS IV SOLN
INTRAVENOUS | Status: DC
  Administered 2021-10-27: 1000 mL via INTRAVENOUS

## 2021-10-27 MED ORDER — MIDAZOLAM HCL 2 MG/2ML IJ SOLN
INTRAMUSCULAR | Status: DC | PRN
  Administered 2021-10-27: 2 mg via INTRAVENOUS

## 2021-10-27 MED ORDER — LIDOCAINE HCL (PF) 2 % IJ SOLN
INTRAMUSCULAR | Status: AC
  Filled 2021-10-27: qty 5

## 2021-10-27 MED ORDER — IBUPROFEN 800 MG PO TABS: 800.0000 mg | ORAL_TABLET | Freq: Three times a day (TID) | ORAL | 0 refills | Status: DC | PRN

## 2021-10-27 MED ORDER — ACETAMINOPHEN 500 MG PO TABS
ORAL_TABLET | ORAL | Status: AC
  Filled 2021-10-27: qty 2

## 2021-10-27 MED ORDER — ONDANSETRON HCL 4 MG/2ML IJ SOLN
INTRAMUSCULAR | Status: DC | PRN
  Administered 2021-10-27: 4 mg via INTRAVENOUS

## 2021-10-27 MED ORDER — FENTANYL CITRATE (PF) 100 MCG/2ML IJ SOLN: 25.0000 ug | INTRAMUSCULAR | Status: DC | PRN

## 2021-10-27 MED ORDER — FENTANYL CITRATE (PF) 100 MCG/2ML IJ SOLN
INTRAMUSCULAR | Status: DC | PRN
  Administered 2021-10-27 (×2): 50 ug via INTRAVENOUS

## 2021-10-27 MED ORDER — LIDOCAINE 2% (20 MG/ML) 5 ML SYRINGE
INTRAMUSCULAR | Status: DC | PRN
  Administered 2021-10-27: 60 mg via INTRAVENOUS

## 2021-10-27 MED ORDER — LIDOCAINE HCL 1 % IJ SOLN
INTRAMUSCULAR | Status: DC | PRN
  Administered 2021-10-27: 10 mL

## 2021-10-27 MED ORDER — EPHEDRINE SULFATE-NACL 50-0.9 MG/10ML-% IV SOSY
PREFILLED_SYRINGE | INTRAVENOUS | Status: DC | PRN
  Administered 2021-10-27: 10 mg via INTRAVENOUS
  Administered 2021-10-27: 5 mg via INTRAVENOUS
  Administered 2021-10-27: 10 mg via INTRAVENOUS

## 2021-10-27 MED ORDER — SODIUM CHLORIDE 0.9 % IR SOLN
Status: DC | PRN
  Administered 2021-10-27: 3000 mL

## 2021-10-27 MED ORDER — DEXAMETHASONE SODIUM PHOSPHATE 10 MG/ML IJ SOLN
INTRAMUSCULAR | Status: DC | PRN
  Administered 2021-10-27: 4 mg via INTRAVENOUS

## 2021-10-27 SURGICAL SUPPLY — 19 items
CATH ROBINSON RED A/P 16FR (CATHETERS) ×2 IMPLANT
DEVICE MYOSURE LITE (MISCELLANEOUS) IMPLANT
DEVICE MYOSURE REACH (MISCELLANEOUS) ×1 IMPLANT
DILATOR CANAL MILEX (MISCELLANEOUS) IMPLANT
DRSG TELFA 3X8 NADH (GAUZE/BANDAGES/DRESSINGS) ×2 IMPLANT
GAUZE 4X4 16PLY ~~LOC~~+RFID DBL (SPONGE) ×4 IMPLANT
GLOVE BIO SURGEON STRL SZ 6.5 (GLOVE) ×2 IMPLANT
GOWN STRL REUS W/TWL LRG LVL3 (GOWN DISPOSABLE) ×2 IMPLANT
IV NS IRRIG 3000ML ARTHROMATIC (IV SOLUTION) ×2 IMPLANT
KIT PROCEDURE FLUENT (KITS) ×2 IMPLANT
KIT TURNOVER CYSTO (KITS) ×2 IMPLANT
MYOSURE XL FIBROID (MISCELLANEOUS)
PACK VAGINAL MINOR WOMEN LF (CUSTOM PROCEDURE TRAY) ×2 IMPLANT
PAD DRESSING TELFA 3X8 NADH (GAUZE/BANDAGES/DRESSINGS) ×1 IMPLANT
PAD OB MATERNITY 4.3X12.25 (PERSONAL CARE ITEMS) ×2 IMPLANT
SEAL CERVICAL OMNI LOK (ABLATOR) IMPLANT
SEAL ROD LENS SCOPE MYOSURE (ABLATOR) ×2 IMPLANT
SYSTEM TISS REMOVAL MYOSURE XL (MISCELLANEOUS) IMPLANT
TOWEL OR 17X26 10 PK STRL BLUE (TOWEL DISPOSABLE) ×2 IMPLANT

## 2021-10-27 NOTE — Transfer of Care (Signed)
Immediate Anesthesia Transfer of Care Note  Patient: Dorothy Lynch  Procedure(s) Performed: Procedure(s) (LRB): DILATATION & CURETTAGE/HYSTEROSCOPY WITH MYOSURE (N/A)  Patient Location: PACU  Anesthesia Type: General  Level of Consciousness: awake, alert  and oriented  Airway & Oxygen Therapy: Patient Spontanous Breathing and Patient connected to face mask oxygen  Post-op Assessment: Report given to PACU RN and Post -op Vital signs reviewed and stable  Post vital signs: Reviewed and stable  Complications: No apparent anesthesia complications  Last Vitals:  Vitals Value Taken Time  BP 112/65 10/27/21 0815  Temp    Pulse 66 10/27/21 0819  Resp 12 10/27/21 0819  SpO2 98 % 10/27/21 0819  Vitals shown include unvalidated device data.  Last Pain:  Vitals:   10/27/21 0634  TempSrc: Oral  PainSc: 0-No pain      Patients Stated Pain Goal: 3 (52/84/13 2440)  Complications: No notable events documented.

## 2021-10-27 NOTE — Op Note (Signed)
OPERATIVE REPORT   PREOPERATIVE DIAGNOSES:   Postmenopausal bleeding, endometrial polyp  POSTOPERATIVE DIAGNOSES:   Postmenopausal bleeding, endometrial polyp, Asherman's syndrome  PROCEDURE:  Hysteroscopy with dilation and curettage and Myosure resection of endometrial polyp  SURGEON:  Lenard Galloway, MD  ANESTHESIA:  LMA, paracervical block with 10 mL of 1% lidocaine.  IV FLUIDS:  500 cc LR  EBL:   5 cc  URINE OUTPUT:   Patient voided prior to arriving in the OR.   NORMAL SALINE DEFICIT:  267 cc  COMPLICATIONS:  None.  INDICATIONS FOR THE PROCEDURE:     The patient is a 61 year old [id number] Caucasian female who presents with recurrent postmenopausal bleeding on hormone replacement therapy.  Pelvic ultrasound showed intramural and submucosal fibroids. Her endometrium measured 5.67 mm.  Her right ovary was normal, and her left ovary was not seen.  Endometrial biopsy showed polypoid features suggestive of an endometrial polyp.  A plan is now made to proceed with a hysteroscopy with dilation and curettage and Myosurgical resection of endometrial polyp; after risks, benefits and alternatives were reviewed.  FINDINGS:  Exam under anesthesia revealed a globular 8 week size mobile uterus.  No adnexal masses were noted.  The uterus was sounded to 7 cm.  Hysteroscopy showed posterior fundal polypoid formation.  There was also evidence of fundal midline scarring consistent with Asherman's syndrome.  The tubal ostia regions were unremarkable.  Endometrial currettings were scant.   SPECIMENS:   The endometrial polyp and the endometrial curettings were sent to Pathology separately.   PROCEDURE IN DETAIL:  The patient was reidentified in the preoperative hold area.  She received TED hose and PAS stockings for DVT prophylaxis.  In the operating room, the patient was placed in the dorsal lithotomy position and then an LMA anesthetic was introduced.  The patient's lower abdomen, vagina and  perineum were sterilely prepped with Betadine and the  patient's bladder was catheterized of urine.  She was sterilly draped.  An exam under anesthesia was performed.  A speculum was placed inside the vagina and a single-tooth tenaculum was placed on the anterior cervical lip.  A paracervical block was performed with a total of 10 mL of 1% lidocaine plain.  The uterus was sounded. The cervix was dilated to a #21 Pratt dilator.  The MyoSure hysteroscope was then inserted inside the uterine cavity under the continuous infusion of normal saline solution.   Findings are as noted above.  The Myosure Reach device was used to resect the endometrial polyp without difficulty.  In the process, the scar tissue at the uterine fundus was removed as well.  The polyp specimen was sent to Pathology.  The MyoSure hysteroscope was removed. The serrated and then sharp curettes were introduced into the uterine cavity and the endometrium was curetted in all 4 quadrants.   A minimal amount of endometrial curettings was obtained.  This specimen was sent to Pathology separately.   The single-tooth tenaculum which had been placed on the anterior cervical lip was removed.     Hemostasis was good, and all of the vaginal instruments were removed.  The patient was awakened and escorted to the recovery room in stable condition after she was cleansed of Betadine.  There were no complications to the procedure.   All needle, instrument and sponge counts were correct.  Lenard Galloway, MD

## 2021-10-27 NOTE — Discharge Instructions (Addendum)
DISCHARGE INSTRUCTIONS: HYSTEROSCOPY / ENDOMETRIAL ABLATION The following instructions have been prepared to help you care for yourself upon your return home.  May Remove Scop patch on or before  May take Ibuprofen after  May take stool softner while taking narcotic pain medication to prevent constipation.  Drink plenty of water.  Personal hygiene:  Use sanitary pads for vaginal drainage, not tampons.  Shower the day after your procedure.  NO tub baths, pools or Jacuzzis for 2-3 weeks.  Wipe front to back after using the bathroom.  Activity and limitations:  Do NOT drive or operate any equipment for 24 hours. The effects of anesthesia are still present and drowsiness may result.  Do NOT rest in bed all day.  Walking is encouraged.  Walk up and down stairs slowly.  You may resume your normal activity in one to two days or as indicated by your physician. Sexual activity: NO intercourse for at least 2 weeks after the procedure, or as indicated by your Doctor.  Diet: Eat a light meal as desired this evening. You may resume your usual diet tomorrow.  Return to Work: You may resume your work activities in one to two days or as indicated by Marine scientist.  What to expect after your surgery: Expect to have vaginal bleeding/discharge for 2-3 days and spotting for up to 10 days. It is not unusual to have soreness for up to 1-2 weeks. You may have a slight burning sensation when you urinate for the first day. Mild cramps may continue for a couple of days. You may have a regular period in 2-6 weeks.  Call your doctor for any of the following:  Excessive vaginal bleeding or clotting, saturating and changing one pad every hour.  Inability to urinate 6 hours after discharge from hospital.  Pain not relieved by pain medication.  Fever of 100.4 F or greater.  Unusual vaginal discharge or odor.  Return to office _________________Call for an appointment ___________________ Patient's signature:  ______________________ Nurse's signature ________________________  Post Anesthesia Care Unit [phone]  Post Anesthesia Home Care Instructions  Activity: Get plenty of rest for the remainder of the day. A responsible individual must stay with you for 24 hours following the procedure.  For the next 24 hours, DO NOT: -Drive a car -Paediatric nurse -Drink alcoholic beverages -Take any medication unless instructed by your physician -Make any legal decisions or sign important papers.  Meals: Start with liquid foods such as gelatin or soup. Progress to regular foods as tolerated. Avoid greasy, spicy, heavy foods. If nausea and/or vomiting occur, drink only clear liquids until the nausea and/or vomiting subsides. Call your physician if vomiting continues.  Special Instructions/Symptoms: Your throat may feel dry or sore from the anesthesia or the breathing tube placed in your throat during surgery. If this causes discomfort, gargle with warm salt water. The discomfort should disappear within 24 hours.   To not take any Tylenol until after 1:00 pm today if needed.

## 2021-10-27 NOTE — Anesthesia Postprocedure Evaluation (Signed)
Anesthesia Post Note  Patient: Dorothy Lynch  Procedure(s) Performed: [place]     Patient location during evaluation: PACU Anesthesia Type: General Pain management: pain level controlled Vital Signs Assessment: post-procedure vital signs reviewed and stable Respiratory status: spontaneous breathing Cardiovascular status: stable Postop Assessment: no apparent nausea or vomiting Anesthetic complications: no   No notable events documented.  Last Vitals:  Vitals:   10/27/21 0634 10/27/21 0815  BP:  112/65  Pulse:  62  Resp:  12  Temp: (!) 36.3 C 36.6 C  SpO2:  96%    Last Pain:  Vitals:   10/27/21 0823  TempSrc:   PainSc: 0-No pain                 Sacoya Mcgourty

## 2021-10-27 NOTE — Anesthesia Procedure Notes (Signed)
Procedure Name: LMA Insertion Date/Time: 10/27/2021 7:41 AM Performed by: Mechele Claude, CRNA Pre-anesthesia Checklist: Patient identified, Emergency Drugs available, Suction available and Patient being monitored Patient Re-evaluated:Patient Re-evaluated prior to induction Oxygen Delivery Method: Circle system utilized Preoxygenation: Pre-oxygenation with 100% oxygen Induction Type: IV induction Ventilation: Mask ventilation without difficulty LMA: LMA inserted LMA Size: 4.0 Number of attempts: 1 Airway Equipment and Method: Bite block Placement Confirmation: positive ETCO2 Tube secured with: Tape Dental Injury: Teeth and Oropharynx as per pre-operative assessment

## 2021-10-27 NOTE — Progress Notes (Signed)
Update to History and Physical  No marked change in status since office pre-op visit.   Patient examined.   OK to proceed with surgery. 

## 2021-10-28 ENCOUNTER — Encounter (HOSPITAL_BASED_OUTPATIENT_CLINIC_OR_DEPARTMENT_OTHER): Payer: Self-pay | Admitting: Obstetrics and Gynecology

## 2021-10-28 ENCOUNTER — Other Ambulatory Visit: Payer: Self-pay | Admitting: Family Medicine

## 2021-10-28 ENCOUNTER — Encounter: Payer: Self-pay | Admitting: Family Medicine

## 2021-10-28 LAB — SURGICAL PATHOLOGY

## 2021-10-29 ENCOUNTER — Other Ambulatory Visit: Payer: Self-pay

## 2021-10-29 DIAGNOSIS — F418 Other specified anxiety disorders: Secondary | ICD-10-CM

## 2021-10-29 DIAGNOSIS — F3181 Bipolar II disorder: Secondary | ICD-10-CM

## 2021-11-03 ENCOUNTER — Telehealth: Payer: Self-pay | Admitting: Family Medicine

## 2021-11-03 NOTE — Telephone Encounter (Signed)
I spoke with patient and offered condolences

## 2021-11-03 NOTE — Telephone Encounter (Signed)
Pt called to inform Dr. Yong Channel that her mother has passed away. She also wanted to make sure Dr. Yong Channel knows how appreciative she is for all he does. States she also hopes you don't over work yourself!

## 2021-11-03 NOTE — Telephone Encounter (Signed)
FYI

## 2021-11-05 NOTE — Progress Notes (Unsigned)
GYNECOLOGY  VISIT   HPI: 61 y.o.   Legally Separated  Caucasian  female   [phone] with Patient's last menstrual period was 09/28/2008.   here for 2 weeks status post [place].  Some menstrual type cramping following surgery. No bleeding.   Final pathology showed benign endometrial polyp and submucous fibroid.   No hyperplasia or malignancy.   She will have a breast biopsy at the end of this month at [facility].   Mother did pass away.    GYNECOLOGIC HISTORY: Patient's last menstrual period was 09/28/2008. Contraception:  PMP Menopausal hormone therapy:  Estrace and Prometrium Last mammogram: 10/21/21 - BI-RADS4, biopsy of right breast recommended.  Last pap smear:  05-19-21 Neg:Pos HR HPV  at [facility], 01-22-19 Neg, 01-19-18 ASCUS:Neg HR HPV, 01-19-17 ASCUS:Neg HR HPV.                OB History     Gravida  3   Para  2   Term      Preterm      AB  1   Living  2      SAB  1   IAB      Ectopic      Multiple      Live Births                 Patient Active Problem List   Diagnosis Date Noted   Vitamin B12 deficiency 02/22/2019   Constipation 12/19/2017   Menopausal symptoms 10/14/2015   Allergic rhinitis 10/14/2015   Yeast infection 10/14/2015   Hypothyroidism 10/14/2015   Primary osteoarthritis of left hip 01/24/2015   History of squamous cell carcinoma of skin    Bipolar II disorder ([place]) 01/18/2011   Insomnia 01/18/2011    Past Medical History:  Diagnosis Date   Allergy    Anxiety    Arthritis    ASCUS of cervix with negative high risk HPV 12/2016   Bipolar 2 disorder (HCC)    Cancer (HCC)    Chronic sinusitis    Chronic venous insufficiency    superficial---  s/p  failed attempt sclerotherapy   DDD (degenerative disc disease)    Depression    Endometrial polyp    Fibroids 06/08/2021   [place] at [facility]- 4.09 cm, 2.67 cm, 1,38 cm   History of basal cell carcinoma (BCC) excision     08-30-2014  left mid medial chest   History of squamous cell carcinoma excision    2016 approx. x3 shoulder area/   left lower leg , shin 02-02-2017 approx.   Hypothyroidism    Macular degeneration    PMB (postmenopausal bleeding)    PONV (postoperative nausea and vomiting)    Scoliosis    Varicose vein of leg     Past Surgical History:  Procedure Laterality Date   ACHILLES TENDON SURGERY  2001   BREAST SURGERY  march 2011   breast bx-B-9, fibrocyctic changes   COLONOSCOPY  02-19-2010  dr Sydell Axon brodie   DILATATION & CURETTAGE/HYSTEROSCOPY WITH MYOSURE N/A 02/21/2017   Procedure: DILATATION & CURETTAGE/HYSTEROSCOPY WITH MYOSURE;  Surgeon: Anastasio Auerbach, MD;  Location: [place];  Service: Gynecology;  Laterality: N/A;   DILATATION & CURETTAGE/HYSTEROSCOPY WITH MYOSURE N/A 10/27/2021   Procedure: DILATATION & CURETTAGE/HYSTEROSCOPY WITH MYOSURE;  Surgeon: Nunzio Cobbs, MD;  Location: [facility];  Service: Gynecology;  Laterality: N/A;   FACIAL COSMETIC SURGERY  2012  INGUINAL HERNIA REPAIR Left 1995   2 hernia's on left side   TOTAL HIP ARTHROPLASTY Left 01/24/2015   Procedure: TOTAL HIP ARTHROPLASTY ANTERIOR APPROACH;  Surgeon: Dorna Leitz, MD;  Location: [place];  Service: Orthopedics;  Laterality: Left;    Current Outpatient Medications  Medication Sig Dispense Refill   Biotin 1 MG CAPS SMARTSIG:1 By Mouth     Cholecalciferol (VITAMIN D3) 2000 units TABS Take 1 tablet by mouth daily.     escitalopram (LEXAPRO) 10 MG tablet TAKE 1 TABLET BY MOUTH EVERY DAY 30 tablet 0   estradiol (ESTRACE) 1 MG tablet Take 1 tablet (1 mg) by mouth daily. 30 tablet 1   fexofenadine (ALLERGY RELIEF) 180 MG tablet TAKE 1 TABLET BY MOUTH EVERY DAY PRN 30 tablet 6   Glucosamine HCl (GLUCOSAMINE PO) Take 1 tablet by mouth daily.     lamoTRIgine (LAMICTAL) 100 MG tablet TAKE 1 TABLET BY MOUTH 2 TIMES DAILY 60 tablet 0   levothyroxine (SYNTHROID) 75 MCG  tablet TAKE 1 TABLET BY MOUTH EVERY DAY BEFORE BREAKFAST 90 tablet 0   Multiple Vitamins-Minerals (SYSTANE ICAPS AREDS2) CAPS Take 2 capsules by mouth daily.     Omega-3 Fatty Acids (FISH OIL PO) Take 1 capsule by mouth daily.      OVER THE COUNTER MEDICATION Sera Vital Takes 4 tablets qhs     progesterone (PROMETRIUM) 200 MG capsule Take 1 capsule (200 mg total) by mouth daily. 30 capsule 1   RETIN-A 0.05 % cream Apply 1 application. topically at bedtime.     venlafaxine XR (EFFEXOR-XR) 75 MG 24 hr capsule TAKE 1 CAPSULE BY MOUTH EVERY DAY WITH BREAKFAST 30 capsule 0   vitamin B-12 (CYANOCOBALAMIN) 50 MCG tablet Take 50 mcg by mouth daily.     zolpidem (AMBIEN) 10 MG tablet TAKE 1 TABLET BY MOUTH EVERY EVENING FOR SLEEP 30 tablet 5   No current facility-administered medications for this visit.     ALLERGIES: Penicillins and Sulfa antibiotics  Family History  Problem Relation Age of Onset   Thyroid disease Mother    Breast cancer Mother 25       84 in 2017, survived breast cancer   Heart failure Mother    Diabetes Father    Hypertension Father    Stroke Father 64       died at 101   CVA Brother        3s smoking   Alcohol abuse Brother        recovering   Cancer Maternal Grandmother 63       lung, smoker   Stroke Maternal Grandfather    Heart disease Paternal Grandmother        mi   Colon cancer Neg Hx    Rectal cancer Neg Hx    Stomach cancer Neg Hx     Social History   Socioeconomic History   Marital status: Legally Separated    Spouse name: Not on file   Number of children: Not on file   Years of education: Not on file   Highest education level: Not on file  Occupational History   Not on file  Tobacco Use   Smoking status: Never   Smokeless tobacco: Never  Vaping Use   Vaping Use: Never used  Substance and Sexual Activity   Alcohol use: Yes    Alcohol/week: 14.0 standard drinks of alcohol    Types: 7 Glasses of wine, 7 Standard drinks or equivalent per  week  Comment: daily- wine    Drug use: No   Sexual activity: Yes    Partners: Male    Birth control/protection: Post-menopausal    Comment: 1st intercourse 61 yo-Fewer than 5 partners  Other Topics Concern   Not on file  Social History Narrative   Re-married April 2022 to husband Merrilee Seashore. Divorced from ex-husband John. 2 children. Daughter Sonia Baller is my patient- works in [place]. Son in [place].        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   PHYSICAL EXAMINATION:    BP 104/60   Ht 5' 8.5" (1.74 m)   Wt 158 lb (71.7 kg)   LMP 09/28/2008   BMI 23.67 kg/m     General appearance: alert, cooperative and appears stated age   Pelvic: External genitalia:  no lesions              Urethra:  normal appearing urethra with no masses, tenderness or lesions              Bartholins and Skenes: normal                 Vagina: normal appearing vagina with normal color and discharge, no lesions              Cervix: no lesions                Bimanual Exam:  Uterus:  normal size, contour, position, consistency, mobility, non-tender              Adnexa: no mass, fullness, tenderness      Chaperone was present for exam:  Estill Bamberg, CMA  ASSESSMENT  Status post hysteroscopy with resection of endometrial polyp, dilation and curettage.  Abnormal mammogram.  Planning breast biopsy this month.  HRT. LGSIL of cervix.   PLAN  Surgical findings, procedure, and pathology report reviewed.  Plan for annual exam with pap and HR HPV testing  in May, 2024.  We discussed discontinuation of her HRT if her breast biopsy shows precancer or cancer.    An After Visit Summary was printed and given to the patient.

## 2021-11-09 ENCOUNTER — Other Ambulatory Visit: Payer: Self-pay | Admitting: Family Medicine

## 2021-11-09 ENCOUNTER — Ambulatory Visit (INDEPENDENT_AMBULATORY_CARE_PROVIDER_SITE_OTHER): Payer: BC Managed Care – PPO | Admitting: Obstetrics and Gynecology

## 2021-11-09 ENCOUNTER — Encounter: Payer: Self-pay | Admitting: Obstetrics and Gynecology

## 2021-11-09 VITALS — BP 104/60 | Ht 68.5 in | Wt 158.0 lb

## 2021-11-09 DIAGNOSIS — Z9889 Other specified postprocedural states: Secondary | ICD-10-CM

## 2021-11-16 DIAGNOSIS — F3181 Bipolar II disorder: Secondary | ICD-10-CM | POA: Diagnosis not present

## 2021-11-18 NOTE — Telephone Encounter (Signed)
See below

## 2021-11-24 ENCOUNTER — Ambulatory Visit: Payer: BC Managed Care – PPO | Admitting: Family Medicine

## 2021-11-25 ENCOUNTER — Other Ambulatory Visit: Payer: Self-pay | Admitting: Radiology

## 2021-11-25 DIAGNOSIS — R921 Mammographic calcification found on diagnostic imaging of breast: Secondary | ICD-10-CM | POA: Diagnosis not present

## 2021-11-26 ENCOUNTER — Other Ambulatory Visit: Payer: Self-pay | Admitting: Family Medicine

## 2021-12-04 ENCOUNTER — Other Ambulatory Visit: Payer: Self-pay | Admitting: Family Medicine

## 2021-12-07 ENCOUNTER — Other Ambulatory Visit: Payer: Self-pay | Admitting: Obstetrics and Gynecology

## 2021-12-07 DIAGNOSIS — F3181 Bipolar II disorder: Secondary | ICD-10-CM | POA: Diagnosis not present

## 2021-12-07 NOTE — Telephone Encounter (Signed)
Please contact patient in follow up to her HRT refill.   She was going to have a biopsy done of the breast.  Please see if this was completed.  I need to see her breast biopsy results before I can refill her HRT.  She needs an annual exam scheduled here with me for May, 2024.

## 2021-12-07 NOTE — Telephone Encounter (Signed)
Last annual exam was 11/2019,no future exam scheduled , last mammogram [facility] in 10/2020 scanned in.

## 2021-12-10 ENCOUNTER — Other Ambulatory Visit: Payer: Self-pay | Admitting: Obstetrics and Gynecology

## 2021-12-10 DIAGNOSIS — M545 Low back pain, unspecified: Secondary | ICD-10-CM | POA: Diagnosis not present

## 2021-12-10 DIAGNOSIS — M79671 Pain in right foot: Secondary | ICD-10-CM | POA: Diagnosis not present

## 2021-12-10 NOTE — Telephone Encounter (Signed)
Please get a copy of patient's final breast biopsy imaging from [facility] for 11/25/21.   I see a surgical pathology report for the biopsy in Epic, but I am not sure of the final recommendations from [place].   After review, if appropriate, I can refill her HRT.

## 2021-12-15 NOTE — Telephone Encounter (Signed)
I called [facility] and was told that patient has results sent to Dr.Hunter her PCP. Patient will need to fill out a medical release form so Dr.Silva can have copy of her results. I called patient and left detailed message on voicemail what [facility] told me and I asked her to call me.

## 2021-12-21 ENCOUNTER — Other Ambulatory Visit: Payer: Self-pay | Admitting: Obstetrics and Gynecology

## 2021-12-21 NOTE — Telephone Encounter (Signed)
Patient breast biopsy results has been scanned in.  Per recall "Return for May, 2024 annual - Dr. Quincy Simmonds."

## 2021-12-28 ENCOUNTER — Ambulatory Visit (INDEPENDENT_AMBULATORY_CARE_PROVIDER_SITE_OTHER): Payer: BC Managed Care – PPO | Admitting: Physician Assistant

## 2021-12-28 ENCOUNTER — Encounter: Payer: Self-pay | Admitting: Physician Assistant

## 2021-12-28 VITALS — BP 110/70 | HR 52 | Temp 98.7°F | Ht 68.5 in | Wt 159.0 lb

## 2021-12-28 DIAGNOSIS — H6690 Otitis media, unspecified, unspecified ear: Secondary | ICD-10-CM

## 2021-12-28 MED ORDER — AZITHROMYCIN 250 MG PO TABS: ORAL_TABLET | ORAL | 0 refills | Status: AC

## 2021-12-28 NOTE — Patient Instructions (Addendum)
Start azithromycin antibiotic  Consider flonase (generic is fine)  Follow-up if new/worsening

## 2021-12-28 NOTE — Progress Notes (Signed)
Dorothy Lynch is a 61 y.o. female here for a new problem of ear pain.   History of Present Illness:   Chief Complaint  Patient presents with   Sinus Problem    Pt c/o congestion, bilateral ear pain, sinus pressure, nasal drainage thick green. No fever or chills. Started last Thursday 7/27.    HPI  Bilateral Ear Pain  Patient complain of bilateral ear pain for the past 5 days. Associated symptoms include nasal congestion, sinus pressure and drainage. Has noticed thick green mucus. States she recently went to [place] to visit her family. Has had sick contacts with family members. Has been using Neti Pot to help alleviate her symptoms.  No other specific treatment tried. She would like her ears to be checked today. No cough. No fever or chills. Denies any other aggravating or relieving factors.   Past Medical History:  Diagnosis Date   Allergy    Anxiety    Arthritis    ASCUS of cervix with negative high risk HPV 12/2016   Bipolar 2 disorder (HCC)    Cancer (HCC)    Chronic sinusitis    Chronic venous insufficiency    superficial---  s/p  failed attempt sclerotherapy   DDD (degenerative disc disease)    Depression    Endometrial polyp    Fibroids 06/08/2021   [place] at [facility] OB[facility]GYN- 4.09 cm, 2.67 cm, 1,38 cm   History of basal cell carcinoma (BCC) excision    08-30-2014  left mid medial chest   History of squamous cell carcinoma excision    2016 approx. x3 shoulder area/   left lower leg , shin 02-02-2017 approx.   Hypothyroidism    Macular degeneration    PMB (postmenopausal bleeding)    PONV (postoperative nausea and vomiting)    Scoliosis    Varicose vein of leg      Social History   Tobacco Use   Smoking status: Never   Smokeless tobacco: Never  Vaping Use   Vaping Use: Never used  Substance Use Topics   Alcohol use: Yes    Alcohol/week: 14.0 standard drinks of alcohol    Types: 7 Glasses of wine, 7 Standard drinks or equivalent per week    Comment: daily- wine    Drug  use: No    Past Surgical History:  Procedure Laterality Date   ACHILLES TENDON SURGERY  2001   BREAST SURGERY  march 2011   breast bx-B-9, fibrocyctic changes   COLONOSCOPY  02-19-2010  dr Sydell Axon brodie   DILATATION & CURETTAGE/HYSTEROSCOPY WITH MYOSURE N/A 02/21/2017   Procedure: DILATATION & CURETTAGE/HYSTEROSCOPY WITH MYOSURE;  Surgeon: Anastasio Auerbach, MD;  Location: [place];  Service: Gynecology;  Laterality: N/A;   DILATATION & CURETTAGE/HYSTEROSCOPY WITH MYOSURE N/A 10/27/2021   Procedure: DILATATION & CURETTAGE/HYSTEROSCOPY WITH MYOSURE;  Surgeon: Nunzio Cobbs, MD;  Location[facility];  Service: Gynecology;  Laterality: N/A;   FACIAL COSMETIC SURGERY  2012   INGUINAL HERNIA REPAIR Left 1995   2 hernia's on left side   TOTAL HIP ARTHROPLASTY Left 01/24/2015   Procedure: TOTAL HIP ARTHROPLASTY ANTERIOR APPROACH;  Surgeon: Dorna Leitz, MD;  Location: [facility];  Service: Orthopedics;  Laterality: Left;    Family History  Problem Relation Age of Onset   Thyroid disease Mother    Breast cancer Mother 72       84 in 2017, survived breast cancer   Heart failure Mother    Diabetes Father  Hypertension Father    Stroke Father 55       died at 42   CVA Brother        12s smoking   Alcohol abuse Brother        recovering   Cancer Maternal Grandmother 63       lung, smoker   Stroke Maternal Grandfather    Heart disease Paternal Grandmother        mi   Colon cancer Neg Hx    Rectal cancer Neg Hx    Stomach cancer Neg Hx     Allergies  Allergen Reactions   Penicillins Other (See Comments)    Headaches    Sulfa Antibiotics Other (See Comments)    migraines    Current Medications:   Current Outpatient Medications:    azithromycin (ZITHROMAX) 250 MG tablet, Take 2 tablets on day 1, then 1 tablet daily on days 2 through 5, Disp: 6 tablet, Rfl: 0   Biotin 1 MG CAPS, SMARTSIG:1 By Mouth, Disp: , Rfl:    Cholecalciferol  (VITAMIN D3) 2000 units TABS, Take 1 tablet by mouth daily., Disp: , Rfl:    cyclobenzaprine (FLEXERIL) 5 MG tablet, TAKE 1 TABLET BY MOUTH 3 TIMES DAILY AS NEEDED FOR MUSCLE SPASMS, DO not take WITHIN 8 hours OF ambien. No driving EIGHT hours AFTER taking., Disp: , Rfl:    escitalopram (LEXAPRO) 20 MG tablet, Take 20 mg by mouth daily., Disp: , Rfl:    estradiol (ESTRACE) 1 MG tablet, Take 1 tablet (1 mg) by mouth daily., Disp: 30 tablet, Rfl: 1   fexofenadine (ALLERGY RELIEF) 180 MG tablet, TAKE 1 TABLET BY MOUTH EVERY DAY PRN, Disp: 30 tablet, Rfl: 6   Glucosamine HCl (GLUCOSAMINE PO), Take 1 tablet by mouth daily., Disp: , Rfl:    lamoTRIgine (LAMICTAL) 100 MG tablet, TAKE 1 TABLET BY MOUTH 2 TIMES DAILY, Disp: 60 tablet, Rfl: 0   levothyroxine (SYNTHROID) 75 MCG tablet, Take 75 mcg by mouth daily before breakfast., Disp: , Rfl:    Multiple Vitamins-Minerals (SYSTANE ICAPS AREDS2) CAPS, Take 2 capsules by mouth daily., Disp: , Rfl:    Omega-3 Fatty Acids (FISH OIL PO), Take 1 capsule by mouth daily. , Disp: , Rfl:    OVER THE COUNTER MEDICATION, Sera Vital Takes 4 tablets qhs, Disp: , Rfl:    progesterone (PROMETRIUM) 200 MG capsule, TAKE 1 CAPSULE BY MOUTH EVERY DAY, Disp: 90 capsule, Rfl: 2   RETIN-A 0.05 % cream, Apply 1 application. topically at bedtime., Disp: , Rfl:    venlafaxine XR (EFFEXOR-XR) 37.5 MG 24 hr capsule, Take 37.5 mg by mouth daily., Disp: , Rfl:    vitamin B-12 (CYANOCOBALAMIN) 50 MCG tablet, Take 50 mcg by mouth daily., Disp: , Rfl:    zolpidem (AMBIEN) 10 MG tablet, TAKE 1 TABLET BY MOUTH EVERY EVENING FOR SLEEP, Disp: 30 tablet, Rfl: 5   Review of Systems:   ROS Negative unless otherwise specified per HPI.   Vitals:   Vitals:   12/28/21 1130  BP: 110/70  Pulse: (!) 52  Temp: 98.7 F (37.1 C)  TempSrc: Temporal  SpO2: 97%  Weight: 159 lb (72.1 kg)  Height: 5' 8.5" (1.74 m)     Body mass index is 23.82 kg/m.  Physical Exam:   Physical Exam Vitals  and nursing note reviewed.  Constitutional:      General: She is not in acute distress.    Appearance: She is well-developed. She is not ill-appearing or toxic-appearing.  HENT:     Head: Normocephalic and atraumatic.     Right Ear: Ear canal and external ear normal. A middle ear effusion is present. Tympanic membrane is erythematous. Tympanic membrane is not retracted or bulging.     Left Ear: Ear canal and external ear normal. A middle ear effusion is present. Tympanic membrane is erythematous. Tympanic membrane is not retracted or bulging.     Nose: Nose normal.     Right Sinus: No maxillary sinus tenderness or frontal sinus tenderness.     Left Sinus: No maxillary sinus tenderness or frontal sinus tenderness.     Mouth/Throat:     Pharynx: Uvula midline. No posterior oropharyngeal erythema.  Eyes:     General: Lids are normal.     Conjunctiva/sclera: Conjunctivae normal.  Neck:     Trachea: Trachea normal.  Cardiovascular:     Rate and Rhythm: Normal rate and regular rhythm.     Heart sounds: Normal heart sounds, S1 normal and S2 normal.  Pulmonary:     Effort: Pulmonary effort is normal.     Breath sounds: Normal breath sounds. No decreased breath sounds, wheezing, rhonchi or rales.  Lymphadenopathy:     Cervical: No cervical adenopathy.  Skin:    General: Skin is warm and dry.  Neurological:     Mental Status: She is alert.  Psychiatric:        Speech: Speech normal.        Behavior: Behavior normal. Behavior is cooperative.     Assessment and Plan:   Acute otitis media, unspecified otitis media type No red flags We did discuss possible COVID testing but she declined at this time as it would not change management for her as today is day 5 and she states that she would not like to take antiviral rx. Start azithromycin due to PCN allergy Follow-up if new/worsening/lack of improvement  I,Savera Zaman,acting as a Education administrator for [employer], PA.,have documented all  relevant documentation on the behalf of Inda Coke, PA,as directed by  Inda Coke, PA while in the presence of Inda Coke, [place].   I, Inda Coke, [place], have reviewed all documentation for this visit. The documentation on 12/28/21 for the exam, diagnosis, procedures, and orders are all accurate and complete.   Inda Coke, PA-C

## 2021-12-30 ENCOUNTER — Other Ambulatory Visit: Payer: Self-pay | Admitting: Family Medicine

## 2022-01-01 ENCOUNTER — Encounter: Payer: Self-pay | Admitting: Family Medicine

## 2022-01-29 ENCOUNTER — Other Ambulatory Visit: Payer: Self-pay | Admitting: Family Medicine

## 2022-02-02 ENCOUNTER — Other Ambulatory Visit: Payer: Self-pay | Admitting: Family Medicine

## 2022-02-02 DIAGNOSIS — M79671 Pain in right foot: Secondary | ICD-10-CM | POA: Diagnosis not present

## 2022-02-02 DIAGNOSIS — M545 Low back pain, unspecified: Secondary | ICD-10-CM | POA: Diagnosis not present

## 2022-02-08 ENCOUNTER — Encounter: Payer: Self-pay | Admitting: Family Medicine

## 2022-02-12 DIAGNOSIS — M545 Low back pain, unspecified: Secondary | ICD-10-CM | POA: Diagnosis not present

## 2022-02-12 DIAGNOSIS — M79671 Pain in right foot: Secondary | ICD-10-CM | POA: Diagnosis not present

## 2022-02-16 ENCOUNTER — Encounter: Payer: Self-pay | Admitting: Family Medicine

## 2022-02-16 MED ORDER — DIAZEPAM 2 MG PO TABS: 2.0000 mg | ORAL_TABLET | Freq: Every day | ORAL | 0 refills | Status: DC | PRN

## 2022-02-22 ENCOUNTER — Encounter: Payer: Self-pay | Admitting: *Deleted

## 2022-03-05 ENCOUNTER — Other Ambulatory Visit: Payer: Self-pay | Admitting: Family Medicine

## 2022-03-15 DIAGNOSIS — M545 Low back pain, unspecified: Secondary | ICD-10-CM | POA: Diagnosis not present

## 2022-03-15 DIAGNOSIS — M79671 Pain in right foot: Secondary | ICD-10-CM | POA: Diagnosis not present

## 2022-03-16 ENCOUNTER — Telehealth: Payer: Self-pay | Admitting: Family Medicine

## 2022-03-16 MED ORDER — METHOCARBAMOL 500 MG PO TABS
500.0000 mg | ORAL_TABLET | Freq: Three times a day (TID) | ORAL | 1 refills | Status: DC | PRN
Start: 2022-03-16 — End: 2022-11-24

## 2022-03-16 NOTE — Telephone Encounter (Signed)
See below

## 2022-03-16 NOTE — Telephone Encounter (Signed)
PCP has no openings until Friday, October 20th. Pt will be out of town by then. Is it okay to schedule pt with another provider or can she be worked in with PCP virtually? Please advise.

## 2022-03-16 NOTE — Telephone Encounter (Signed)
See below, a mychart was sent prior to this phone note.

## 2022-03-16 NOTE — Telephone Encounter (Signed)
Will respond by MyChart message

## 2022-03-16 NOTE — Telephone Encounter (Signed)
Pt was previously on Flexeril for muscle spasms but it caused severe drowsiness. She had PT today & states now she cannot move her neck along with back pain. She is heading out of town in the morning and requesting an rx to replace the Flexeril. Please advise.   Pharmacy:  [facility] [place], [place] - [address] Phone: [phone]  Fax: [phone]

## 2022-04-03 ENCOUNTER — Other Ambulatory Visit: Payer: Self-pay | Admitting: Family Medicine

## 2022-04-14 DIAGNOSIS — M79671 Pain in right foot: Secondary | ICD-10-CM | POA: Diagnosis not present

## 2022-04-14 DIAGNOSIS — M545 Low back pain, unspecified: Secondary | ICD-10-CM | POA: Diagnosis not present

## 2022-05-04 ENCOUNTER — Other Ambulatory Visit: Payer: Self-pay | Admitting: Family Medicine

## 2022-05-08 ENCOUNTER — Other Ambulatory Visit: Payer: Self-pay | Admitting: Family Medicine

## 2022-05-11 ENCOUNTER — Encounter: Payer: Self-pay | Admitting: Family Medicine

## 2022-05-12 ENCOUNTER — Encounter: Payer: Self-pay | Admitting: Family Medicine

## 2022-05-13 ENCOUNTER — Encounter: Payer: Self-pay | Admitting: Family Medicine

## 2022-05-13 ENCOUNTER — Ambulatory Visit: Payer: BC Managed Care – PPO | Admitting: Family Medicine

## 2022-05-13 VITALS — BP 112/70 | HR 52 | Temp 97.9°F | Ht 68.5 in | Wt 155.2 lb

## 2022-05-13 DIAGNOSIS — R5383 Other fatigue: Secondary | ICD-10-CM | POA: Diagnosis not present

## 2022-05-13 DIAGNOSIS — E538 Deficiency of other specified B group vitamins: Secondary | ICD-10-CM | POA: Diagnosis not present

## 2022-05-13 DIAGNOSIS — F3181 Bipolar II disorder: Secondary | ICD-10-CM | POA: Diagnosis not present

## 2022-05-13 DIAGNOSIS — E039 Hypothyroidism, unspecified: Secondary | ICD-10-CM

## 2022-05-13 LAB — TSH: TSH: 4.25 u[IU]/mL (ref 0.35–5.50)

## 2022-05-13 LAB — COMPREHENSIVE METABOLIC PANEL
ALT: 11 U/L (ref 0–35)
AST: 13 U/L (ref 0–37)
Albumin: 4 g/dL (ref 3.5–5.2)
Alkaline Phosphatase: 60 U/L (ref 39–117)
BUN: 14 mg/dL (ref 6–23)
CO2: 26 mEq/L (ref 19–32)
Calcium: 9.1 mg/dL (ref 8.4–10.5)
Chloride: 103 mEq/L (ref 96–112)
Creatinine, Ser: 0.73 mg/dL (ref 0.40–1.20)
GFR: 87.82 mL/min (ref >60.00)
Glucose, Bld: 90 mg/dL (ref 70–99)
Potassium: 4 mEq/L (ref 3.5–5.1)
Sodium: 135 mEq/L (ref 135–145)
Total Bilirubin: 0.4 mg/dL (ref 0.2–1.2)
Total Protein: 6.3 g/dL (ref 6.0–8.3)

## 2022-05-13 LAB — CBC WITH DIFFERENTIAL/PLATELET
Basophils Absolute: 0.1 10*3/uL (ref 0.0–0.1)
Basophils Relative: 0.9 % (ref 0.0–3.0)
Eosinophils Absolute: 0.2 10*3/uL (ref 0.0–0.7)
Eosinophils Relative: 2.9 % (ref 0.0–5.0)
HCT: 37.6 % (ref 36.0–46.0)
Hemoglobin: 12.6 g/dL (ref 12.0–15.0)
Lymphocytes Relative: 32.2 % (ref 12.0–46.0)
Lymphs Abs: 2.1 10*3/uL (ref 0.7–4.0)
MCHC: 33.6 g/dL (ref 30.0–36.0)
MCV: 94.7 fl (ref 78.0–100.0)
Monocytes Absolute: 0.6 10*3/uL (ref 0.1–1.0)
Monocytes Relative: 8.6 % (ref 3.0–12.0)
Neutro Abs: 3.7 10*3/uL (ref 1.4–7.7)
Neutrophils Relative %: 55.4 % (ref 43.0–77.0)
Platelets: 268 10*3/uL (ref 150.0–400.0)
RBC: 3.97 Mil/uL (ref 3.87–5.11)
RDW: 13.2 % (ref 11.5–15.5)
WBC: 6.6 10*3/uL (ref 4.0–10.5)

## 2022-05-13 LAB — VITAMIN B12: Vitamin B-12: 809 pg/mL (ref 211–911)

## 2022-05-13 MED ORDER — ESCITALOPRAM OXALATE 20 MG PO TABS: 20.0000 mg | ORAL_TABLET | Freq: Every day | ORAL | 3 refills | Status: DC

## 2022-05-13 MED ORDER — LEVOTHYROXINE SODIUM 75 MCG PO TABS
ORAL_TABLET | ORAL | 3 refills | Status: DC
Start: 2022-05-13 — End: 2022-11-26

## 2022-05-13 NOTE — Patient Instructions (Addendum)
Let us know when you get your second [facility] and COVID vaccines.  Bipolar had stabilized very well on lexapro 20 mg and lamictal '100mg'$  twice daily. She had been weaned off venlafaxine by psychiatry. Once she stabilized wanted to transfer care back to me but we did not receive notes that I am aware (reviewed media tab and do not see) but since the lexapro reverted back to prior 10 mg rx from me- she has had worsening symptoms- we opted to increase back to 20 mg since she was doing so well and if does not stabilize- then we can always refer back to [name], [place] or NP with psychiatry for help- recommended 6-8 week follow -also rule out organic causes with labs  Send [place] the other date for shingrix shot- we have one from June 22, 2019  Recommended follow up: Return in about 6 weeks (around 06/24/2022) for followup or sooner if needed.Schedule b4 you leave.

## 2022-05-13 NOTE — Progress Notes (Signed)
Phone [phone] In person visit   Subjective:   Dorothy Lynch is a 61 y.o. year old very pleasant female patient who presents for/with See problem oriented charting Chief Complaint  Patient presents with   lab work    Pt requesting lab work (see Pharmacist, community message). Pt got sick on thanksgiving and has not bounced back.   Past Medical History-  Patient Active Problem List   Diagnosis Date Noted   Bipolar II disorder ([place]) 01/18/2011    Priority: High   Vitamin B12 deficiency 02/22/2019    Priority: Medium    Menopausal symptoms 10/14/2015    Priority: Medium    Hypothyroidism 10/14/2015    Priority: Medium    Insomnia 01/18/2011    Priority: Medium    Constipation 12/19/2017    Priority: Low   Allergic rhinitis 10/14/2015    Priority: Low   Yeast infection 10/14/2015    Priority: Low   Primary osteoarthritis of left hip 01/24/2015    Priority: Low   History of squamous cell carcinoma of skin     Priority: Low    Medications- reviewed and updated Current Outpatient Medications  Medication Sig Dispense Refill   Biotin 1 MG CAPS SMARTSIG:1 By Mouth     Cholecalciferol (VITAMIN D3) 2000 units TABS Take 1 tablet by mouth daily.     diazepam (VALIUM) 2 MG tablet Take 1 tablet (2 mg total) by mouth daily as needed for anxiety (No alcohol for 4 hours before taking, no driving, alcohol use for 12 hours after taking). 5 tablet 0   escitalopram (LEXAPRO) 10 MG tablet TAKE 1 TABLET BY MOUTH EVERY DAY 30 tablet 0   estradiol (ESTRACE) 1 MG tablet Take 1 tablet (1 mg) by mouth daily. 30 tablet 1   fexofenadine (ALLERGY RELIEF) 180 MG tablet TAKE 1 TABLET BY MOUTH EVERY DAY PRN 30 tablet 6   Glucosamine HCl (GLUCOSAMINE PO) Take 1 tablet by mouth daily.     lamoTRIgine (LAMICTAL) 100 MG tablet TAKE 1 TABLET BY MOUTH 2 TIMES DAILY FOR bipolar 60 tablet 0   levothyroxine (SYNTHROID) 75 MCG tablet TAKE 1 TABLET BY MOUTH EVERY DAY WITH BREAKFAST 90 tablet 0   methocarbamol (ROBAXIN) 500 MG  tablet Take 1 tablet (500 mg total) by mouth every 8 (eight) hours as needed for muscle spasms (Do not drive for 8 hours after taking). 30 tablet 1   Multiple Vitamins-Minerals (SYSTANE ICAPS AREDS2) CAPS Take 2 capsules by mouth daily.     Omega-3 Fatty Acids (FISH OIL PO) Take 1 capsule by mouth daily.      OVER THE COUNTER MEDICATION Sera Vital Takes 4 tablets qhs     progesterone (PROMETRIUM) 200 MG capsule TAKE 1 CAPSULE BY MOUTH EVERY DAY 90 capsule 2   RETIN-A 0.05 % cream Apply 1 application. topically at bedtime.     vitamin B-12 (CYANOCOBALAMIN) 50 MCG tablet Take 50 mcg by mouth daily.     zolpidem (AMBIEN) 10 MG tablet TAKE 1 TABLET BY MOUTH EVERY EVENING FOR SLEEP 30 tablet 5   No current facility-administered medications for this visit.     Objective:  BP 112/70   Pulse (!) 52   Temp 97.9 F (36.6 C)   Ht 5' 8.5" (1.74 m)   Wt 155 lb 3.2 oz (70.4 kg)   LMP 09/28/2008   SpO2 100%   BMI 23.25 kg/m  Gen: NAD, resting comfortably CV: Mildly bradycardic but regular no murmurs rubs or gallops Lungs: CTAB no crackles,  wheeze, rhonchi Abdomen: soft/nontender/nondistended/normal bowel sounds. No rebound or guarding.  Ext: no edema Skin: warm, dry     Assessment and Plan   # Fatigue S: Patient reports illness started around Thanksgiving with a GI illness/stomach virus for about 6 days-feels she does not have her strength back.  She does not feel she has bounced back.  Current symptoms includefeeling good for several hours and then having off sinking spells.  She does not feel like she wants to get out of bed and feels like her excitement levels are lower.  No chest pain or shortness of breath reported  A/P: ongoing fatigue- update labs. Sounds like may be more related to drop off in lexapro dose form 20 mg to 20 mg- see discussion below  -Mildly bradycardic but doubt that is the cause of her symptoms  # Bipolar 2 disorder S: Medication:Lexapro 10 mg (had been doing well  on '20mg'$  dose but has been unable to get a refill on this at the 20 mg dose and dropped down to 10 mg ago about 3 months ago, Lamictal 100 mg twice daily, still needs Ambien 10 mg for sleep -Worked with psychiatry and was taken off of venlafaxine - denies any significant hypomania symptoms on lamictal- still helpful    05/13/2022   12:14 PM 05/13/2022   11:33 AM 10/19/2021    3:15 PM  Depression screen PHQ 2/9  Decreased Interest '2 2 2  '$ Down, Depressed, Hopeless '2 1 3  '$ PHQ - 2 Score '4 3 5  '$ Altered sleeping 2 0 3  Tired, decreased energy '2 2 2  '$ Change in appetite 0 0 0  Feeling bad or failure about yourself  1 0 3  Trouble concentrating 2 0 3  Moving slowly or fidgety/restless 0 0 0  Suicidal thoughts 0 0 0  PHQ-9 Score '11 5 16  '$ Difficult doing work/chores Somewhat difficult Not difficult at all Very difficult  A/P: Bipolar had stabilized very well on lexapro 20 mg and lamictal '100mg'$  twice daily. Poor control with phq9 at 11. She had been weaned off venlafaxine by psychiatry. Once she stabilized wanted to transfer care back to me but we did not receive notes that I am aware (reviewed media tab and do not see) but since the lexapro reverted back to prior 10 mg rx from me- she has had worsening symptoms- we opted to increase back to 20 mg since she was doing so well and if does not stabilize- then we can always refer back to Dorothy Lynch or Dorothy Lynch with psychiatry for help if needed- recommended 6-8 week follow -also rule out organic causes with labs   #hypothyroidism S: compliant On thyroid medication-levothyroxine 75 mcg Lab Results  Component Value Date   TSH 3.20 10/19/2021  A/P: hopefully stable- update tsh today. Continue current meds for now    # B12 deficiency S: Current treatment/medication (oral vs. IM): 50 mcg of b12 at least  Lab Results  Component Value Date   [id number] 119 08/22/2019  A/P: Hopefuly stable or improved- update B12 with labs today. Continue current meds for  now.  #Vitamin D-continues to take supplement-she is not 100% sure if she is ever had low values in the past-2000 units is reasonable to maintain levels Lab Results  Component Value Date   VD25OH 50.93 12/11/2018   Recommended follow up: Return in about 6 weeks (around 06/24/2022) for followup or sooner if needed.Schedule b4 you leave.  Lab/Order associations:   ICD-10-CM  1. Bipolar II disorder ([place])  F31.81 CBC with Differential/Platelet    Comprehensive metabolic panel    2. Vitamin B12 deficiency  E53.8 Vitamin B12    3. Hypothyroidism, unspecified type  E03.9 TSH    4. Fatigue, unspecified type  R53.83 CBC with Differential/Platelet    Comprehensive metabolic panel      Meds ordered this encounter  Medications   escitalopram (LEXAPRO) 20 MG tablet    Sig: Take 1 tablet (20 mg total) by mouth daily.    Dispense:  90 tablet    Refill:  3   levothyroxine (SYNTHROID) 75 MCG tablet    Sig: TAKE 1 TABLET BY MOUTH EVERY DAY WITH BREAKFAST    Dispense:  90 tablet    Refill:  3    Return precautions advised.  Garret Reddish, MD

## 2022-05-13 NOTE — Telephone Encounter (Signed)
Seeing dr hunter at 1120 05/13/22

## 2022-05-13 NOTE — Assessment & Plan Note (Signed)
S: Medication:Lexapro 10 mg (had been doing well on '20mg'$  dose but has been unable to get a refill on this at the 20 mg dose and dropped down to 10 mg ago about 3 months ago, Lamictal 100 mg twice daily, still needs Ambien 10 mg for sleep -Worked with psychiatry and was taken off of venlafaxine - denies any significant hypomania symptoms on lamictal- still helpful    05/13/2022   12:14 PM 05/13/2022   11:33 AM 10/19/2021    3:15 PM  Depression screen PHQ 2/9  Decreased Interest '2 2 2  '$ Down, Depressed, Hopeless '2 1 3  '$ PHQ - 2 Score '4 3 5  '$ Altered sleeping 2 0 3  Tired, decreased energy '2 2 2  '$ Change in appetite 0 0 0  Feeling bad or failure about yourself  1 0 3  Trouble concentrating 2 0 3  Moving slowly or fidgety/restless 0 0 0  Suicidal thoughts 0 0 0  PHQ-9 Score '11 5 16  '$ Difficult doing work/chores Somewhat difficult Not difficult at all Very difficult  A/P: Bipolar had stabilized very well on lexapro 20 mg and lamictal '100mg'$  twice daily. Poor control with phq9 at 11. She had been weaned off venlafaxine by psychiatry. Once she stabilized wanted to transfer care back to me but we did not receive notes that I am aware (reviewed media tab and do not see) but since the lexapro reverted back to prior 10 mg rx from me- she has had worsening symptoms- we opted to increase back to 20 mg since she was doing so well and if does not stabilize- then we can always refer back to [name], E[place] or NP with psychiatry for help if needed- recommended 6-8 week follow -also rule out organic causes with labs

## 2022-05-26 ENCOUNTER — Other Ambulatory Visit: Payer: Self-pay | Admitting: Family Medicine

## 2022-05-27 ENCOUNTER — Telehealth: Payer: Self-pay | Admitting: Family Medicine

## 2022-05-27 NOTE — Telephone Encounter (Signed)
Pt would like a call back with lab results and wanted you to know she has Covid

## 2022-05-28 NOTE — Telephone Encounter (Signed)
Called and lm on pt vm tcb, also made aware results have been released to her via mychart along with Dr. Ronney Lion comments.

## 2022-06-04 ENCOUNTER — Other Ambulatory Visit: Payer: Self-pay | Admitting: Obstetrics and Gynecology

## 2022-06-04 DIAGNOSIS — Z79890 Hormone replacement therapy (postmenopausal): Secondary | ICD-10-CM

## 2022-06-04 NOTE — Telephone Encounter (Signed)
Last AEX 02/15/2020 w/ JK.  Has seen Dr. Quincy Simmonds for office visits and procedures in 2023. However, not currently scheduled for one.  Last mammo- 10/21/2021- birads 4 (Rt breast) Rt Breast Bx-11/25/2021-fibrocystic changes consistent w/ ductal hyperplasia.   Last fill 06/04/2022. Please advise.

## 2022-06-05 ENCOUNTER — Other Ambulatory Visit: Payer: Self-pay | Admitting: Family Medicine

## 2022-06-24 ENCOUNTER — Other Ambulatory Visit: Payer: Self-pay | Admitting: Family Medicine

## 2022-06-29 DIAGNOSIS — Z85828 Personal history of other malignant neoplasm of skin: Secondary | ICD-10-CM | POA: Diagnosis not present

## 2022-06-29 DIAGNOSIS — L570 Actinic keratosis: Secondary | ICD-10-CM | POA: Diagnosis not present

## 2022-06-29 DIAGNOSIS — L738 Other specified follicular disorders: Secondary | ICD-10-CM | POA: Diagnosis not present

## 2022-06-29 DIAGNOSIS — I8392 Asymptomatic varicose veins of left lower extremity: Secondary | ICD-10-CM | POA: Diagnosis not present

## 2022-06-29 DIAGNOSIS — I8391 Asymptomatic varicose veins of right lower extremity: Secondary | ICD-10-CM | POA: Diagnosis not present

## 2022-06-30 DIAGNOSIS — H353132 Nonexudative age-related macular degeneration, bilateral, intermediate dry stage: Secondary | ICD-10-CM | POA: Diagnosis not present

## 2022-06-30 DIAGNOSIS — H35423 Microcystoid degeneration of retina, bilateral: Secondary | ICD-10-CM | POA: Diagnosis not present

## 2022-06-30 DIAGNOSIS — H2513 Age-related nuclear cataract, bilateral: Secondary | ICD-10-CM | POA: Diagnosis not present

## 2022-06-30 DIAGNOSIS — H43823 Vitreomacular adhesion, bilateral: Secondary | ICD-10-CM | POA: Diagnosis not present

## 2022-07-05 ENCOUNTER — Other Ambulatory Visit: Payer: Self-pay | Admitting: Family Medicine

## 2022-07-28 ENCOUNTER — Other Ambulatory Visit: Payer: Self-pay | Admitting: Family Medicine

## 2022-07-30 ENCOUNTER — Other Ambulatory Visit: Payer: Self-pay | Admitting: Obstetrics and Gynecology

## 2022-07-30 DIAGNOSIS — Z79890 Hormone replacement therapy (postmenopausal): Secondary | ICD-10-CM

## 2022-08-02 ENCOUNTER — Other Ambulatory Visit: Payer: Self-pay | Admitting: Family Medicine

## 2022-08-18 ENCOUNTER — Other Ambulatory Visit: Payer: Self-pay | Admitting: Obstetrics and Gynecology

## 2022-08-19 DIAGNOSIS — M79671 Pain in right foot: Secondary | ICD-10-CM | POA: Diagnosis not present

## 2022-08-19 DIAGNOSIS — M545 Low back pain, unspecified: Secondary | ICD-10-CM | POA: Diagnosis not present

## 2022-09-01 DIAGNOSIS — H01001 Unspecified blepharitis right upper eyelid: Secondary | ICD-10-CM | POA: Diagnosis not present

## 2022-09-01 DIAGNOSIS — H04123 Dry eye syndrome of bilateral lacrimal glands: Secondary | ICD-10-CM | POA: Diagnosis not present

## 2022-09-01 DIAGNOSIS — H01004 Unspecified blepharitis left upper eyelid: Secondary | ICD-10-CM | POA: Diagnosis not present

## 2022-09-01 DIAGNOSIS — H353132 Nonexudative age-related macular degeneration, bilateral, intermediate dry stage: Secondary | ICD-10-CM | POA: Diagnosis not present

## 2022-09-02 ENCOUNTER — Other Ambulatory Visit: Payer: Self-pay | Admitting: Family Medicine

## 2022-09-03 ENCOUNTER — Other Ambulatory Visit: Payer: Self-pay | Admitting: Obstetrics and Gynecology

## 2022-09-03 DIAGNOSIS — Z79890 Hormone replacement therapy (postmenopausal): Secondary | ICD-10-CM

## 2022-09-03 NOTE — Telephone Encounter (Signed)
Med refill request: estradiol 1 mg tab PO daily Last AEX: 02/15/20 Melvenia Needles Next AEX: 11/02/22 / BS ; last OV psot op f/u on 11/09/21 Last MMG (if hormonal med) [facility] 10/21/21, followed by right breast biopsy 11/25/21, benign.    Refill authorized: Please Advise? Rx pended #30/1RF, needs AEX for future refills.

## 2022-10-04 ENCOUNTER — Other Ambulatory Visit: Payer: Self-pay | Admitting: Family Medicine

## 2022-10-11 ENCOUNTER — Other Ambulatory Visit: Payer: Self-pay | Admitting: Family Medicine

## 2022-10-12 ENCOUNTER — Other Ambulatory Visit: Payer: Self-pay | Admitting: Obstetrics and Gynecology

## 2022-10-13 ENCOUNTER — Other Ambulatory Visit: Payer: Self-pay

## 2022-10-13 DIAGNOSIS — Z79890 Hormone replacement therapy (postmenopausal): Secondary | ICD-10-CM

## 2022-10-13 MED ORDER — PROGESTERONE 200 MG PO CAPS: 200.0000 mg | ORAL_CAPSULE | Freq: Every evening | ORAL | 0 refills | Status: DC

## 2022-10-13 NOTE — Telephone Encounter (Signed)
Voicemail's received from both pt's pharmacy and pt stating that she is in desperate need of her progesterone 200mg  caps prescription.  Pt stated that she has now been a week w/o rx. States she and pharmacy have been trying to reach [place] w/o success.   Last AEX 02/15/2020--scheduled for next on 11/02/2022. However, has been seen in office for visits for problems in btwn. Last seen on 11/09/2021 for post op appt after D&C/Hysteroscopy w/ myosure performed on 10/27/2021 for PMB on HRTs (Path-benign)  Last mammo 10/21/2021- birads 4, Rt Breast Bx 11/25/2021-benign, recommended returning to screening in 29yr.   I spoke w/ pt to confirm if she had been continuing to take the estrogen w/o progesterone and she reports that she has. So, to confirm, has been taking estrogen only daily for ~1 week w/o progesterone.  Rx pend. Please advise.

## 2022-10-16 ENCOUNTER — Other Ambulatory Visit: Payer: Self-pay | Admitting: Family Medicine

## 2022-10-19 NOTE — Progress Notes (Unsigned)
62 y.o. [id number] Legally Separated Caucasian female here for annual exam. Pt wants to discuss lesions on the outside of her labia. Pt wants to discuss night sweats and discussed sex drive.   Taking Estrace 1 mg daily and Prometrium 100 mg nightly. No bleeding of spotting.  Ran out of progesterone for several days.   Having night sweats 7 - 10 nights per month.  No hot flashes during the day.  Stopped Effexor about 6 months ago and is now taking Lexapro.  Taking Lamictal. Formal dx of bipolar disorder.  Daughter married last year.   PCP:  Dr. Durene Cal.    Patient's last menstrual period was 09/28/2008.           Sexually active: Yes.    The current method of family planning is post menopausal status.    Exercising: Yes.     Walking, yoga class, treading water Smoker:  no  Health Maintenance: Pap:  05-19-21 Neg:Pos HR HPV  at [facility], 01-22-19 Neg, 01-19-18 ASCUS:Neg HR HPV, 01-19-17 ASCUS:Neg HR HPV  History of abnormal Pap:  yes MMG:  10/21/21 - BI-RADS4, biopsy showed fibrocystic change.  Colonoscopy:  01/06/21 BMD:   n/a  Result  n/a TDaP:  10/14/15 Gardasil:   no Hep C: 10/14/15 Screening Labs:  PCP   reports that she has never smoked. She has never used smokeless tobacco. She reports current alcohol use of about 14.0 standard drinks of alcohol per week. She reports that she does not use drugs.  Past Medical History:  Diagnosis Date   Allergy    Anxiety    Arthritis    ASCUS of cervix with negative high risk HPV 12/2016   Bipolar 2 disorder (HCC)    Cancer (HCC)    Chronic sinusitis    Chronic venous insufficiency    superficial---  s/p  failed attempt sclerotherapy   DDD (degenerative disc disease)    Depression    Endometrial polyp    Fibroids 06/08/2021   [place] at [facility]- 4.09 cm, 2.67 cm, 1,38 cm   History of basal cell carcinoma (BCC) excision    08-30-2014  left mid medial chest   History of squamous cell carcinoma excision    2016 approx. x3  shoulder area/   left lower leg , shin 02-02-2017 approx.   Hypothyroidism    Macular degeneration    PMB (postmenopausal bleeding)    PONV (postoperative nausea and vomiting)    Scoliosis    Varicose vein of leg     Past Surgical History:  Procedure Laterality Date   ACHILLES TENDON SURGERY  2001   BREAST SURGERY  march 2011   breast bx-B-9, fibrocyctic changes   COLONOSCOPY  02-19-2010  dr Verlee Monte brodie   DILATATION & CURETTAGE/HYSTEROSCOPY WITH MYOSURE N/A 02/21/2017   Procedure: DILATATION & CURETTAGE/HYSTEROSCOPY WITH MYOSURE;  Surgeon: Dara Lords, MD;  Location: [facility];  Service: Gynecology;  Laterality: N/A;   DILATATION & CURETTAGE/HYSTEROSCOPY WITH MYOSURE N/A 10/27/2021   Procedure: DILATATION & CURETTAGE/HYSTEROSCOPY WITH MYOSURE;  Surgeon: Patton Salles, MD;  Location[facility];  Service: Gynecology;  Laterality: N/A;   FACIAL COSMETIC SURGERY  2012   INGUINAL HERNIA REPAIR Left 1995   2 hernia's on left side   TOTAL HIP ARTHROPLASTY Left 01/24/2015   Procedure: TOTAL HIP ARTHROPLASTY ANTERIOR APPROACH;  Surgeon: Jodi Geralds, MD;  Location: [facility] OR;  Service: Orthopedics;  Laterality: Left;    Current Outpatient Medications  Medication Sig Dispense Refill   Biotin 1 MG CAPS SMARTSIG:1 By Mouth     Cholecalciferol (VITAMIN D3) 2000 units TABS Take 1 tablet by mouth daily.     escitalopram (LEXAPRO) 20 MG tablet TAKE 1 TABLET BY MOUTH EVERY DAY 30 tablet 0   estradiol (ESTRACE) 1 MG tablet TAKE 1 TABLET BY MOUTH ONCE DAILY 30 tablet 1   fexofenadine (ALLEGRA) 180 MG tablet TAKE 1 TABLET BY MOUTH EVERY DAY AS NEEDED 30 tablet 6   Glucosamine HCl (GLUCOSAMINE PO) Take 1 tablet by mouth daily.     lamoTRIgine (LAMICTAL) 100 MG tablet TAKE 1 TABLET BY MOUTH 2 TIMES DAILY. Schedule A FOLLOWING UP WITH Dr. Durene Cal FOR more refills 30 tablet 0   levothyroxine (SYNTHROID) 75 MCG tablet TAKE 1 TABLET BY MOUTH EVERY DAY WITH  BREAKFAST 90 tablet 3   Multiple Vitamins-Minerals (SYSTANE ICAPS AREDS2) CAPS Take 2 capsules by mouth daily.     Omega-3 Fatty Acids (FISH OIL PO) Take 1 capsule by mouth daily.      OVER THE COUNTER MEDICATION Sera Vital Takes 4 tablets qhs     progesterone (PROMETRIUM) 200 MG capsule Take 1 capsule (200 mg total) by mouth at bedtime. 30 capsule 0   RETIN-A 0.05 % cream Apply 1 application. topically at bedtime.     vitamin B-12 (CYANOCOBALAMIN) 50 MCG tablet Take 50 mcg by mouth daily.     zolpidem (AMBIEN) 10 MG tablet TAKE 1 TABLET BY MOUTH EVERY EVENING FOR SLEEP 30 tablet 5   diazepam (VALIUM) 2 MG tablet Take 1 tablet (2 mg total) by mouth daily as needed for anxiety (No alcohol for 4 hours before taking, no driving, alcohol use for 12 hours after taking). (Patient not taking: Reported on 11/02/2022) 5 tablet 0   methocarbamol (ROBAXIN) 500 MG tablet Take 1 tablet (500 mg total) by mouth every 8 (eight) hours as needed for muscle spasms (Do not drive for 8 hours after taking). (Patient not taking: Reported on 11/02/2022) 30 tablet 1   No current facility-administered medications for this visit.    Family History  Problem Relation Age of Onset   Thyroid disease Mother    Breast cancer Mother 45       68 in 2017, survived breast cancer   Heart failure Mother    Diabetes Father    Hypertension Father    Stroke Father 34       died at 70   CVA Brother        19s smoking   Alcohol abuse Brother        recovering   Cancer Maternal Grandmother 78       lung, smoker   Stroke Maternal Grandfather    Heart disease Paternal Grandmother        mi   Colon cancer Neg Hx    Rectal cancer Neg Hx    Stomach cancer Neg Hx     Review of Systems  All other systems reviewed and are negative.   Exam:   BP 122/80 (BP Location: Right Arm, Patient Position: Sitting, Cuff Size: Normal)   Pulse 65   Ht 5' 8.5" (1.74 m)   Wt 156 lb (70.8 kg)   LMP 09/28/2008   SpO2 97%   BMI 23.37 kg/m      General appearance: alert, cooperative and appears stated age Head: normocephalic, without obvious abnormality, atraumatic Neck: no adenopathy, supple, symmetrical, trachea midline and thyroid normal to inspection and palpation Lungs: clear  to auscultation bilaterally Breasts: normal appearance, no masses or tenderness, No nipple retraction or dimpling, No nipple discharge or bleeding, No axillary adenopathy Heart: regular rate and rhythm Abdomen: soft, non-tender; no masses, no organomegaly Extremities: extremities normal, atraumatic, no cyanosis or edema Skin: skin color, texture, turgor normal. No rashes or lesions Lymph nodes: cervical, supraclavicular, and axillary nodes normal. Neurologic: grossly normal  Pelvic: External genitalia:  no lesions              No abnormal inguinal nodes palpated.              Urethra:  normal appearing urethra with no masses, tenderness or lesions              Bartholins and Skenes: normal                 Vagina: normal appearing vagina with normal color and discharge, no lesions              Cervix: no lesions.  Mild blood from the os, mixed with mucus.              Pap taken: yes Bimanual Exam:  Uterus:  normal size, contour, position, consistency, mobility, non-tender              Adnexa: no mass, fullness, tenderness              Rectal exam: yes.  Confirms.              Anus:  normal sphincter tone, no lesions  Chaperone was present for exam:  Warren Lacy, CMA  Assessment:   Well woman visit with gynecologic exam. Status post hysteroscopy with resection of endometrial polyp, dilation and curettage.  HRT. Positive HR HPV. Postmenopausal bleeding.  Missed doses of Prometrium. Vaginitis.   Plan: Mammogram screening discussed.  She will schedule at [facility]. Self breast awareness reviewed. Pap and HR HPV as above. Guidelines for Calcium, Vitamin D, regular exercise program including cardiovascular and weight bearing exercise. Discused WHI and  use of HRT which can increase risk of PE, DVT, MI, stroke and breast cancer.  She would like to increased her Estrace and Prometrium.   Will increase to Estrace 2 mg daily and Prometrium 200 mg q hs.  Wet prep - negative. She will call if she has continued vaginal bleeding.  We will then evaluate with sonohysterogram and possible endometrial biopsy.   Follow up annually and prn.   After visit summary provided.

## 2022-11-01 ENCOUNTER — Other Ambulatory Visit: Payer: Self-pay | Admitting: Family Medicine

## 2022-11-01 ENCOUNTER — Other Ambulatory Visit: Payer: Self-pay | Admitting: Obstetrics and Gynecology

## 2022-11-01 DIAGNOSIS — Z79890 Hormone replacement therapy (postmenopausal): Secondary | ICD-10-CM

## 2022-11-02 ENCOUNTER — Ambulatory Visit (INDEPENDENT_AMBULATORY_CARE_PROVIDER_SITE_OTHER): Payer: BC Managed Care – PPO | Admitting: Obstetrics and Gynecology

## 2022-11-02 ENCOUNTER — Encounter: Payer: Self-pay | Admitting: Obstetrics and Gynecology

## 2022-11-02 ENCOUNTER — Other Ambulatory Visit (HOSPITAL_COMMUNITY)
Admission: RE | Admit: 2022-11-02 | Discharge: 2022-11-02 | Disposition: A | Discharge status: Home / Self Care | Payer: BC Managed Care – PPO | Source: Ambulatory Visit | Attending: Obstetrics and Gynecology | Admitting: Obstetrics and Gynecology

## 2022-11-02 VITALS — BP 122/80 | HR 65 | Ht 68.5 in | Wt 156.0 lb

## 2022-11-02 DIAGNOSIS — Z79890 Hormone replacement therapy (postmenopausal): Secondary | ICD-10-CM

## 2022-11-02 DIAGNOSIS — Z124 Encounter for screening for malignant neoplasm of cervix: Secondary | ICD-10-CM | POA: Insufficient documentation

## 2022-11-02 DIAGNOSIS — Z01419 Encounter for gynecological examination (general) (routine) without abnormal findings: Secondary | ICD-10-CM

## 2022-11-02 DIAGNOSIS — R87810 Cervical high risk human papillomavirus (HPV) DNA test positive: Secondary | ICD-10-CM | POA: Diagnosis not present

## 2022-11-02 DIAGNOSIS — N760 Acute vaginitis: Secondary | ICD-10-CM

## 2022-11-02 LAB — WET PREP FOR TRICH, YEAST, CLUE

## 2022-11-02 MED ORDER — ESTRADIOL 2 MG PO TABS: ORAL_TABLET | ORAL | 3 refills | Status: DC

## 2022-11-02 MED ORDER — PROGESTERONE 200 MG PO CAPS: 200.0000 mg | ORAL_CAPSULE | Freq: Every evening | ORAL | 3 refills | Status: DC

## 2022-11-02 NOTE — Patient Instructions (Signed)

## 2022-11-08 ENCOUNTER — Encounter: Payer: Self-pay | Admitting: Obstetrics and Gynecology

## 2022-11-08 LAB — CYTOLOGY - PAP
Comment: NEGATIVE
Diagnosis: NEGATIVE
High risk HPV: NEGATIVE

## 2022-11-15 ENCOUNTER — Other Ambulatory Visit: Payer: Self-pay | Admitting: Family Medicine

## 2022-11-18 ENCOUNTER — Encounter: Payer: Self-pay | Admitting: Family Medicine

## 2022-11-24 ENCOUNTER — Encounter: Payer: Self-pay | Admitting: Family Medicine

## 2022-11-24 ENCOUNTER — Ambulatory Visit: Payer: BC Managed Care – PPO | Admitting: Family Medicine

## 2022-11-24 VITALS — BP 104/80 | HR 54 | Temp 98.0°F | Ht 68.0 in | Wt 156.4 lb

## 2022-11-24 DIAGNOSIS — Z79899 Other long term (current) drug therapy: Secondary | ICD-10-CM | POA: Diagnosis not present

## 2022-11-24 DIAGNOSIS — E039 Hypothyroidism, unspecified: Secondary | ICD-10-CM

## 2022-11-24 DIAGNOSIS — E538 Deficiency of other specified B group vitamins: Secondary | ICD-10-CM | POA: Diagnosis not present

## 2022-11-24 DIAGNOSIS — F3181 Bipolar II disorder: Secondary | ICD-10-CM | POA: Diagnosis not present

## 2022-11-24 LAB — CBC WITH DIFFERENTIAL/PLATELET
Basophils Absolute: 0 10*3/uL (ref 0.0–0.1)
Basophils Relative: 0.8 % (ref 0.0–3.0)
Eosinophils Absolute: 0.2 10*3/uL (ref 0.0–0.7)
Eosinophils Relative: 3.7 % (ref 0.0–5.0)
HCT: 38.4 % (ref 36.0–46.0)
Hemoglobin: 12.5 g/dL (ref 12.0–15.0)
Lymphocytes Relative: 27.4 % (ref 12.0–46.0)
Lymphs Abs: 1.6 10*3/uL (ref 0.7–4.0)
MCHC: 32.5 g/dL (ref 30.0–36.0)
MCV: 99.2 fl (ref 78.0–100.0)
Monocytes Absolute: 0.6 10*3/uL (ref 0.1–1.0)
Monocytes Relative: 9.3 % (ref 3.0–12.0)
Neutro Abs: 3.5 10*3/uL (ref 1.4–7.7)
Neutrophils Relative %: 58.8 % (ref 43.0–77.0)
Platelets: 263 10*3/uL (ref 150.0–400.0)
RBC: 3.87 Mil/uL (ref 3.87–5.11)
RDW: 13.3 % (ref 11.5–15.5)
WBC: 6 10*3/uL (ref 4.0–10.5)

## 2022-11-24 LAB — COMPREHENSIVE METABOLIC PANEL
ALT: 10 U/L (ref 0–35)
AST: 14 U/L (ref 0–37)
Albumin: 3.9 g/dL (ref 3.5–5.2)
Alkaline Phosphatase: 61 U/L (ref 39–117)
BUN: 15 mg/dL (ref 6–23)
CO2: 27 mEq/L (ref 19–32)
Calcium: 9.2 mg/dL (ref 8.4–10.5)
Chloride: 103 mEq/L (ref 96–112)
Creatinine, Ser: 0.93 mg/dL (ref 0.40–1.20)
GFR: 65.43 mL/min (ref >60.00)
Glucose, Bld: 82 mg/dL (ref 70–99)
Potassium: 4.2 mEq/L (ref 3.5–5.1)
Sodium: 136 mEq/L (ref 135–145)
Total Bilirubin: 0.5 mg/dL (ref 0.2–1.2)
Total Protein: 6.4 g/dL (ref 6.0–8.3)

## 2022-11-24 LAB — TSH: TSH: 9.6 u[IU]/mL — ABNORMAL HIGH (ref 0.35–5.50)

## 2022-11-24 LAB — VITAMIN B12: Vitamin B-12: >1500 pg/mL — ABNORMAL HIGH (ref 211–911)

## 2022-11-24 MED ORDER — ESCITALOPRAM OXALATE 20 MG PO TABS: 20.0000 mg | ORAL_TABLET | Freq: Every day | ORAL | 3 refills | Status: DC

## 2022-11-24 MED ORDER — LAMOTRIGINE 100 MG PO TABS: ORAL_TABLET | ORAL | 2 refills | Status: DC

## 2022-11-24 MED ORDER — FEXOFENADINE HCL 180 MG PO TABS: ORAL_TABLET | ORAL | 3 refills | Status: DC

## 2022-11-24 NOTE — Progress Notes (Signed)
Phone [phone] In person visit   Subjective:   Dorothy Lynch is a 62 y.o. year old very pleasant female patient who presents for/with See problem oriented charting Chief Complaint  Patient presents with   Fatigue    Pt c/o low energy and fatigue that started several months ago that's been off and off with migraines/lightheadedness   Depression    Wants to discuss Abilify or Latuda, she does not feel as if she is doing as well as she should be on current meds.   Past Medical History-  Patient Active Problem List   Diagnosis Date Noted   Bipolar II disorder (HCC) 01/18/2011    Priority: High   Vitamin B12 deficiency 02/22/2019    Priority: Medium    Menopausal symptoms 10/14/2015    Priority: Medium    Hypothyroidism 10/14/2015    Priority: Medium    Insomnia 01/18/2011    Priority: Medium    Constipation 12/19/2017    Priority: Low   Allergic rhinitis 10/14/2015    Priority: Low   Yeast infection 10/14/2015    Priority: Low   Primary osteoarthritis of left hip 01/24/2015    Priority: Low   History of squamous cell carcinoma of skin     Priority: Low    Medications- reviewed and updated Current Outpatient Medications  Medication Sig Dispense Refill   Biotin 1 MG CAPS SMARTSIG:1 By Mouth     Cholecalciferol (VITAMIN D3) 2000 units TABS Take 1 tablet by mouth daily.     estradiol (ESTRACE) 2 MG tablet TAKE 1 TABLET BY MOUTH ONCE DAILY 90 tablet 3   Glucosamine HCl (GLUCOSAMINE PO) Take 1 tablet by mouth daily.     levothyroxine (SYNTHROID) 75 MCG tablet TAKE 1 TABLET BY MOUTH EVERY DAY WITH BREAKFAST 90 tablet 3   Multiple Vitamins-Minerals (SYSTANE ICAPS AREDS2) CAPS Take 2 capsules by mouth daily.     Omega-3 Fatty Acids (FISH OIL PO) Take 1 capsule by mouth daily.      OVER THE COUNTER MEDICATION Sera Vital Takes 4 tablets qhs     progesterone (PROMETRIUM) 200 MG capsule Take 1 capsule (200 mg total) by mouth at bedtime. 90 capsule 3   RETIN-A 0.05 % cream Apply 1  application. topically at bedtime.     vitamin B-12 (CYANOCOBALAMIN) 50 MCG tablet Take 50 mcg by mouth daily.     escitalopram (LEXAPRO) 20 MG tablet Take 1 tablet (20 mg total) by mouth daily. 90 tablet 3   fexofenadine (ALLEGRA) 180 MG tablet TAKE 1 TABLET BY MOUTH EVERY DAY AS NEEDED 90 tablet 3   lamoTRIgine (LAMICTAL) 100 MG tablet TAKE 1 TABLET BY MOUTH 2 TIMES DAILY. 180 tablet 2   zolpidem (AMBIEN) 10 MG tablet TAKE 1 TABLET BY MOUTH EVERY EVENING FOR SLEEP (Patient not taking: Reported on 11/24/2022) 30 tablet 5   No current facility-administered medications for this visit.     Objective:  BP 104/80   Pulse (!) 54   Temp 98 F (36.7 C)   Ht 5\' 8"  (1.727 m)   Wt 156 lb 6.4 oz (70.9 kg)   LMP 09/28/2008   SpO2 95%   BMI 23.78 kg/m  Gen: NAD, resting comfortably Left tympanic membrane slightly more amber colored than right but no air fluid levels CV: RRR no murmurs rubs or gallops Lungs: CTAB no crackles, wheeze, rhonchi Ext: no edema Skin: warm, dry     Assessment and Plan   # Fatigue/low energy/lightheaded S:patient with low energy and  fatigue for a few months off and on. Gets 1-2 headaches a year- not formally diagnosed with migraines in past but does have occasional headache(s). No chest pain or shortness of breath   - no worsening pattern but did have one recently.  -she has also felt some lightheadededness recently -we reflected back on visit from 05/13/22 and symptoms esseentially have continued since post thanksgiving - vitamin D normal in 2020 with similar symptoms A/P: Fatigue with ongoing issues- we will update labs to rule out organic causes  -also with prior B12 deficiency- update B12 today (is on oral B12) -left ear does have more amber color- wonder about fluid behind membrane- she is on Neti pot and allegra for allergies- can add Flonase OTC (available over the counter without a prescription) and if ear doesn't improve let me know within 2-3 weeks  #  Bipolar II disorder S:patient feels she has been dealing with depression on escitalopram 20 mg and Lamictal 100 mg twice daily. Also uses Ambien for sleep. Prior weaned off of venlafaxine through aide of Ms. Derrill Kay, NP with psychiatry - more anhedonia and feels exhaustion- not even enjoying exercising like she has in the past.  -PHQ9 of 15 and GAD7 of 10- both are worse    05/13/2022   12:14 PM 05/13/2022   11:33 AM 10/19/2021    3:15 PM  Depression screen PHQ 2/9  Decreased Interest 2 2 2   Down, Depressed, Hopeless 2 1 3   PHQ - 2 Score 4 3 5   Altered sleeping 2 0 3  Tired, decreased energy 2 2 2   Change in appetite 0 0 0  Feeling bad or failure about yourself  1 0 3  Trouble concentrating 2 0 3  Moving slowly or fidgety/restless 0 0 0  Suicidal thoughts 0 0 0  PHQ-9 Score 11 5 16   Difficult doing work/chores Somewhat difficult Not difficult at all Very difficult  A/P: Bipolar with depression poorly controlled at this tim with PHQ9 of 15- I suspect a lot of her energy issues are related to this- she asked about Abilify add on but may have side effects in combo with Lamictal and we wanted to get psychiatry opinion again- she agrees to return to [facility] for next steps  -continue current medications for now - I refilled Lexapro and Lamictal -thankfully no thoughts of self harm  #hypothyroidism S: compliant On thyroid medication-levothyroxine 75 mcg  Lab Results  Component Value Date   TSH 4.25 05/13/2022   A/P:hopefully stable- update tsh today with fatigue . Continue current meds for now    Recommended follow up: Return in about 6 months (around 05/26/2023) for physical or sooner if needed.Schedule b4 you leave. No future appointments.  Lab/Order associations:   ICD-10-CM   1. Bipolar II disorder (HCC)  F31.81 Comprehensive metabolic panel    CBC with Differential/Platelet    2. Hypothyroidism, unspecified type  E03.9 Comprehensive metabolic panel    CBC with  Differential/Platelet    TSH    3. Vitamin B12 deficiency  E53.8 Vitamin B12    4. High risk medication use  Z79.899 Comprehensive metabolic panel    CBC with Differential/Platelet      Meds ordered this encounter  Medications   escitalopram (LEXAPRO) 20 MG tablet    Sig: Take 1 tablet (20 mg total) by mouth daily.    Dispense:  90 tablet    Refill:  3   fexofenadine (ALLEGRA) 180 MG tablet    Sig: TAKE 1 TABLET  BY MOUTH EVERY DAY AS NEEDED    Dispense:  90 tablet    Refill:  3   lamoTRIgine (LAMICTAL) 100 MG tablet    Sig: TAKE 1 TABLET BY MOUTH 2 TIMES DAILY.    Dispense:  180 tablet    Refill:  2    Return precautions advised.  Tana Conch, MD

## 2022-11-24 NOTE — Patient Instructions (Addendum)
we wanted to get psychiatry opinion again- she agrees to return to [facility] for next steps   Check to make sure scheduled for mammogram with [facility] and have them send [place] a copy  can add Flonase OTC (available over the counter without a prescription) and if ear doesn't improve let me know within 2-3 weeks  Please stop by lab before you go If you have mychart- we will send your results within 3 business days of [place] receiving them.  If you do not have mychart- we will call you about results within 5 business days of [place] receiving them.  *please also note that you will see labs on mychart as soon as they post. I will later go in and write notes on them- will say "notes from Dr. Durene Cal"   Recommended follow up: Return in about 6 months (around 05/26/2023) for physical or sooner if needed.Schedule b4 you leave.

## 2022-11-25 ENCOUNTER — Encounter: Payer: Self-pay | Admitting: Family Medicine

## 2022-11-26 ENCOUNTER — Other Ambulatory Visit: Payer: Self-pay

## 2022-11-26 DIAGNOSIS — E039 Hypothyroidism, unspecified: Secondary | ICD-10-CM

## 2022-11-26 MED ORDER — LEVOTHYROXINE SODIUM 88 MCG PO TABS: ORAL_TABLET | ORAL | 5 refills | Status: DC

## 2022-12-24 DIAGNOSIS — M545 Low back pain, unspecified: Secondary | ICD-10-CM | POA: Diagnosis not present

## 2022-12-24 DIAGNOSIS — M546 Pain in thoracic spine: Secondary | ICD-10-CM | POA: Diagnosis not present

## 2022-12-27 ENCOUNTER — Other Ambulatory Visit: Payer: Self-pay | Admitting: Family Medicine

## 2022-12-27 NOTE — Telephone Encounter (Signed)
Can you check with her- has been given hydrocodone recently- want to make sure she knows cannot take hydrocodone within 8 hours either direction of taking Ambien

## 2022-12-27 NOTE — Telephone Encounter (Signed)
Mychart message sent.

## 2022-12-28 NOTE — Telephone Encounter (Signed)
Pt has responded via mychart.  

## 2023-01-05 ENCOUNTER — Other Ambulatory Visit: Payer: BC Managed Care – PPO

## 2023-01-05 DIAGNOSIS — H353132 Nonexudative age-related macular degeneration, bilateral, intermediate dry stage: Secondary | ICD-10-CM | POA: Diagnosis not present

## 2023-01-05 DIAGNOSIS — H2513 Age-related nuclear cataract, bilateral: Secondary | ICD-10-CM | POA: Diagnosis not present

## 2023-01-05 DIAGNOSIS — H43822 Vitreomacular adhesion, left eye: Secondary | ICD-10-CM | POA: Diagnosis not present

## 2023-01-05 DIAGNOSIS — H35423 Microcystoid degeneration of retina, bilateral: Secondary | ICD-10-CM | POA: Diagnosis not present

## 2023-01-06 ENCOUNTER — Other Ambulatory Visit: Payer: BC Managed Care – PPO

## 2023-04-25 ENCOUNTER — Other Ambulatory Visit: Payer: Self-pay | Admitting: Family Medicine

## 2023-05-17 DIAGNOSIS — L0109 Other impetigo: Secondary | ICD-10-CM | POA: Diagnosis not present

## 2023-05-17 DIAGNOSIS — L570 Actinic keratosis: Secondary | ICD-10-CM | POA: Diagnosis not present

## 2023-05-17 DIAGNOSIS — Z85828 Personal history of other malignant neoplasm of skin: Secondary | ICD-10-CM | POA: Diagnosis not present

## 2023-06-09 ENCOUNTER — Encounter: Payer: Self-pay | Admitting: Family Medicine

## 2023-06-09 ENCOUNTER — Ambulatory Visit (INDEPENDENT_AMBULATORY_CARE_PROVIDER_SITE_OTHER): Payer: BC Managed Care – PPO | Admitting: Family Medicine

## 2023-06-09 VITALS — BP 100/70 | HR 58 | Temp 97.0°F | Ht 68.0 in | Wt 159.2 lb

## 2023-06-09 DIAGNOSIS — Z0000 Encounter for general adult medical examination without abnormal findings: Secondary | ICD-10-CM

## 2023-06-09 DIAGNOSIS — F3181 Bipolar II disorder: Secondary | ICD-10-CM

## 2023-06-09 DIAGNOSIS — E538 Deficiency of other specified B group vitamins: Secondary | ICD-10-CM | POA: Diagnosis not present

## 2023-06-09 DIAGNOSIS — E039 Hypothyroidism, unspecified: Secondary | ICD-10-CM | POA: Diagnosis not present

## 2023-06-09 DIAGNOSIS — Z13220 Encounter for screening for lipoid disorders: Secondary | ICD-10-CM

## 2023-06-09 NOTE — Progress Notes (Signed)
 Phone [phone]   Subjective:  Patient presents today for their annual physical. Chief complaint-noted.   See problem oriented charting- ROS- full  review of systems was completed and negative except for: some morning difficulty getting out of bed but feels well once up and moving  The following were reviewed and entered/updated in epic: Past Medical History:  Diagnosis Date   Allergy    Anxiety    Arthritis    ASCUS of cervix with negative high risk HPV 12/2016   Bipolar 2 disorder (HCC)    Cancer (HCC)    Chronic sinusitis    Chronic venous insufficiency    superficial---  s/p  failed attempt sclerotherapy   CIN I (cervical intraepithelial neoplasia I) 2023   DDD (degenerative disc disease)    Depression    Endometrial polyp    Fibroids 06/08/2021   US  at [facility]- 4.09 cm, 2.67 cm, 1,38 cm   History of basal cell carcinoma (BCC) excision    08-30-2014  left mid medial chest   History of squamous cell carcinoma excision    2016 approx. x3 shoulder area/   left lower leg , shin 02-02-2017 approx.   Hypothyroidism    Macular degeneration    PMB (postmenopausal bleeding)    PONV (postoperative nausea and vomiting)    Scoliosis    Varicose vein of leg    Patient Active Problem List   Diagnosis Date Noted   Bipolar II disorder (HCC) 01/18/2011    Priority: High   Vitamin B12 deficiency 02/22/2019    Priority: Medium    Menopausal symptoms 10/14/2015    Priority: Medium    Hypothyroidism 10/14/2015    Priority: Medium    Insomnia 01/18/2011    Priority: Medium    Constipation 12/19/2017    Priority: Low   Allergic rhinitis 10/14/2015    Priority: Low   Yeast infection 10/14/2015    Priority: Low   Primary osteoarthritis of left hip 01/24/2015    Priority: Low   History of squamous cell carcinoma of skin     Priority: Low   Past Surgical History:  Procedure Laterality Date   ACHILLES TENDON SURGERY  2001   BREAST SURGERY  march 2011   breast bx-B-9,  fibrocyctic changes   COLONOSCOPY  02-19-2010  dr princella brodie   DILATATION & CURETTAGE/HYSTEROSCOPY WITH MYOSURE N/A 02/21/2017   Procedure: DILATATION & CURETTAGE/HYSTEROSCOPY WITH MYOSURE;  Surgeon: Rockney Evalene SQUIBB, MD;  Location: [facility];  Service: Gynecology;  Laterality: N/A;   DILATATION & CURETTAGE/HYSTEROSCOPY WITH MYOSURE N/A 10/27/2021   Procedure: DILATATION & CURETTAGE/HYSTEROSCOPY WITH MYOSURE;  Surgeon: Cathlyn JAYSON Nikki Bobie FORBES, MD;  Location: [facility];  Service: Gynecology;  Laterality: N/A;   FACIAL COSMETIC SURGERY  2012   INGUINAL HERNIA REPAIR Left 1995   2 hernia's on left side   TOTAL HIP ARTHROPLASTY Left 01/24/2015   Procedure: TOTAL HIP ARTHROPLASTY ANTERIOR APPROACH;  Surgeon: Norleen Gavel, MD;  Location: [facility] OR;  Service: Orthopedics;  Laterality: Left;    Family History  Problem Relation Age of Onset   Thyroid  disease Mother    Breast cancer Mother 26       76 in 2017, survived breast cancer   Heart failure Mother    Diabetes Father    Hypertension Father    Stroke Father 60       died at 33   CVA Brother        51s smoking  Alcohol abuse Brother        recovering   Cancer Maternal Grandmother 30       lung, smoker   Stroke Maternal Grandfather    Heart disease Paternal Grandmother        mi   Colon cancer Neg Hx    Rectal cancer Neg Hx    Stomach cancer Neg Hx     Medications- reviewed and updated Current Outpatient Medications  Medication Sig Dispense Refill   Biotin 1 MG CAPS SMARTSIG:1 By Mouth     Cholecalciferol (VITAMIN D3) 2000 units TABS Take 1 tablet by mouth daily.     escitalopram  (LEXAPRO ) 20 MG tablet Take 1 tablet (20 mg total) by mouth daily. 90 tablet 3   estradiol  (ESTRACE ) 2 MG tablet TAKE 1 TABLET BY MOUTH ONCE DAILY 90 tablet 3   fexofenadine  (ALLEGRA ) 180 MG tablet TAKE 1 TABLET BY MOUTH EVERY DAY AS NEEDED 90 tablet 3   Glucosamine HCl (GLUCOSAMINE PO) Take 1 tablet by mouth daily.      lamoTRIgine  (LAMICTAL ) 100 MG tablet TAKE 1 TABLET BY MOUTH 2 TIMES DAILY. 180 tablet 2   levothyroxine  (SYNTHROID ) 88 MCG tablet TAKE 1 TABLET BY MOUTH EVERY DAY WITH BREAKFAST 30 tablet 0   Multiple Vitamins-Minerals (SYSTANE ICAPS AREDS2) CAPS Take 2 capsules by mouth daily.     Omega-3 Fatty Acids (FISH OIL PO) Take 1 capsule by mouth daily.      OVER THE COUNTER MEDICATION Sera Vital Takes 4 tablets qhs     progesterone  (PROMETRIUM ) 200 MG capsule Take 1 capsule (200 mg total) by mouth at bedtime. 90 capsule 3   RETIN-A  0.05 % cream Apply 1 application. topically at bedtime.     vitamin B-12 (CYANOCOBALAMIN ) 50 MCG tablet Take 50 mcg by mouth daily.     zolpidem  (AMBIEN ) 10 MG tablet TAKE 1 TABLET BY MOUTH EVERY EVENING FOR SLEEP 30 tablet 5   No current facility-administered medications for this visit.    Allergies-reviewed and updated Allergies  Allergen Reactions   Penicillins Other (See Comments)    Headaches    Sulfa Antibiotics Other (See Comments)    migraines    Social History   Social History Narrative   Re-married April 2022 to husband Karleen. Divorced from ex-husband John. 2 children. Daughter Randall is my patient- works in [place]. Son in [place].        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 Oropharynx normal Neck: no thyromegaly CV: RRR no murmurs rubs or gallops Lungs: CTAB no crackles, wheeze, rhonchi Abdomen: soft/nontender/nondistended/normal bowel sounds. No rebound or guarding.  Ext: no edema Skin: warm, dry Neuro: grossly normal, moves all extremities, PERRLA   Assessment and  Plan   63 y.o. female presenting for annual physical.  Health Maintenance counseling: 1. Anticipatory guidance: Patient counseled regarding regular dental exams -q6 months, eye exams - yearly,  avoiding smoking and second hand smoke , limiting alcohol to 1 beverage per day , no illicit drugs .  2. Risk factor reduction:  Advised patient of need for regular exercise and diet rich and fruits and vegetables to reduce risk of heart attack and stroke.  Exercise- planning to start.  Diet/weight management-up 8 lbs but discussed maintaining- heavy closes today Wt Readings from Last 3 Encounters:  06/09/23 159 lb 3.2 oz (72.2 kg)  11/24/22 156 lb 6.4 oz (70.9 kg)  11/02/22 156 lb (70.8 kg)  3. Immunizations/screenings/ancillary studies - declines flu, COVID. [facility]- asking team to request ara out today)  Immunization History  Administered Date(s) Administered   Janssen (J&J) SARS-COV-2 Vaccination 08/29/2019   Tdap 10/14/2015   Zoster Recombinant(Shingrix) 06/22/2019   4. Cervical cancer screening- 11/02/22 with Dr. Nikki 5. Breast cancer screening-  breast exam with gyn and mammogram - with [facility]- states she is due but will get set up- I have one from 2023 on file 6. Colon cancer screening - 01/06/21 with 10 year repeat 7. Skin cancer screening- sees dermatology regularly- Dr. Bonney. advised regular sunscreen use. Denies worrisome, changing, or new skin lesions.  8. Birth control/STD check- postmenopausal/ monomgomous with husband 9. Osteoporosis screening at 76- wants to wait for next year 10. Smoking associated screening - never smoker  Status of chronic or acute concerns   # hormone replacement therapy through gynecology- estradiol  and progesterone   # Bipolar S: Medication:Lexapro  20 mg, Lamictal  100 mg twice daily, Ambien  10 mg nightly -poor control last visit and encouraged return to [facility]- but she felt much better. Thyroid  had also been off -has an old home  that is about to sell that will take some stress off of her -moved into a new place that she enjoys. Fatigue better once up and moving but hard to get out of bed in last few years still.     11/24/2022    9:53 AM 05/13/2022   12:14 PM 05/13/2022   11:33 AM  Depression screen PHQ 2/9  Decreased Interest 3 2 2   Down, Depressed, Hopeless 2 2 1   PHQ - 2 Score 5 4 3   Altered sleeping 3 2 0  Tired, decreased energy 3 2 2   Change in appetite 0 0 0  Feeling bad or failure about yourself  1 1 0  Trouble concentrating 2 2 0  Moving slowly or fidgety/restless 1 0 0  Suicidal thoughts 0 0 0  PHQ-9 Score 15 11 5   Difficult doing work/chores Very difficult Somewhat difficult Not difficult at all  A/P: depression scores much improved down to 9 from 15, General Anxiety Disorder - 7 question screening survey only at 7- she feels much better/stable and wants to stay on same dose. No SI. No mania or hypermania -thinks exercise in pool in back yard should help- plans to start at least walking  #hypothyroidism S: compliant On thyroid  medication- levothyroxine  88 mcg up from 75 mcg Lab Results  Component Value Date   TSH 9.60 (H) 11/24/2022   A/P:hopefully stable- update tsh today. Continue current meds for now    # Screening hyperlipidemia S: Medication:none  Lab Results  Component Value Date   CHOL 213 (H) 03/17/2021   HDL 93.00 03/17/2021   LDLCALC 55 09/12/2014   LDLDIRECT 82.0 03/17/2021   TRIG 377.0 (H) 03/17/2021   CHOLHDL 2 03/17/2021   A/P: hopefully improved- triglyceride(s) high but not in range for medicine- update today  # B12 deficiency S: Current treatment/medication (oral vs. IM):  50 mcg Lab Results  Component Value Date   VITAMINB12 >1500 (  H) 11/24/2022  A/P: repleted fully at last visit- continue current medications and update levels   Recommended follow up: Return in about 1 year (around 06/08/2024) for physical or sooner if needed.Schedule b4 you leave.  Lab/Order  associations:NOT fasting   ICD-10-CM   1. Preventative health care  Z00.00     2. Bipolar II disorder (HCC)  F31.81 CBC with Differential/Platelet    3. Hypothyroidism, unspecified type  E03.9 CBC with Differential/Platelet    TSH    4. Vitamin B12 deficiency  E53.8 Vitamin B12    5. Screening for hyperlipidemia  Z13.220 Comprehensive metabolic panel    Lipid panel     No orders of the defined types were placed in this encounter.  Return precautions advised.  Garnette Lukes, MD

## 2023-06-09 NOTE — Patient Instructions (Signed)
 Call [facility] and get mammogram scheduled.  Team please call and get dates [facility]  Schedule a lab visit at the check out desk within 2 weeks. Return for future fasting labs meaning nothing but water after midnight please. Ok to take your medications with water.   Recommended follow up: Return in about 1 year (around 06/08/2024) for physical or sooner if needed.Schedule b4 you leave.

## 2023-06-21 ENCOUNTER — Other Ambulatory Visit: Payer: Self-pay | Admitting: Family Medicine

## 2023-06-21 ENCOUNTER — Encounter: Payer: Self-pay | Admitting: Family Medicine

## 2023-06-21 ENCOUNTER — Other Ambulatory Visit (INDEPENDENT_AMBULATORY_CARE_PROVIDER_SITE_OTHER): Payer: BC Managed Care – PPO

## 2023-06-21 DIAGNOSIS — E538 Deficiency of other specified B group vitamins: Secondary | ICD-10-CM | POA: Diagnosis not present

## 2023-06-21 DIAGNOSIS — Z13220 Encounter for screening for lipoid disorders: Secondary | ICD-10-CM | POA: Diagnosis not present

## 2023-06-21 DIAGNOSIS — F3181 Bipolar II disorder: Secondary | ICD-10-CM

## 2023-06-21 DIAGNOSIS — E039 Hypothyroidism, unspecified: Secondary | ICD-10-CM | POA: Diagnosis not present

## 2023-06-21 LAB — COMPREHENSIVE METABOLIC PANEL
ALT: 12 U/L (ref 0–35)
AST: 17 U/L (ref 0–37)
Albumin: 4 g/dL (ref 3.5–5.2)
Alkaline Phosphatase: 56 U/L (ref 39–117)
BUN: 11 mg/dL (ref 6–23)
CO2: 22 meq/L (ref 19–32)
Calcium: 8.9 mg/dL (ref 8.4–10.5)
Chloride: 103 meq/L (ref 96–112)
Creatinine, Ser: 0.79 mg/dL (ref 0.40–1.20)
GFR: 79.26 mL/min (ref >60.00)
Glucose, Bld: 94 mg/dL (ref 70–99)
Potassium: 4.2 meq/L (ref 3.5–5.1)
Sodium: 135 meq/L (ref 135–145)
Total Bilirubin: 0.5 mg/dL (ref 0.2–1.2)
Total Protein: 6.8 g/dL (ref 6.0–8.3)

## 2023-06-21 LAB — LIPID PANEL
Cholesterol: 249 mg/dL — ABNORMAL HIGH (ref 0–200)
HDL: 96.9 mg/dL (ref >39.00)
LDL Cholesterol: 114 mg/dL — ABNORMAL HIGH (ref 0–99)
NonHDL: 152.11
Total CHOL/HDL Ratio: 3
Triglycerides: 189 mg/dL — ABNORMAL HIGH (ref 0.0–149.0)
VLDL: 37.8 mg/dL (ref 0.0–40.0)

## 2023-06-21 LAB — CBC WITH DIFFERENTIAL/PLATELET
Basophils Absolute: 0 10*3/uL (ref 0.0–0.1)
Basophils Relative: 0.8 % (ref 0.0–3.0)
Eosinophils Absolute: 0.2 10*3/uL (ref 0.0–0.7)
Eosinophils Relative: 3.4 % (ref 0.0–5.0)
HCT: 40 % (ref 36.0–46.0)
Hemoglobin: 13.3 g/dL (ref 12.0–15.0)
Lymphocytes Relative: 28.6 % (ref 12.0–46.0)
Lymphs Abs: 1.6 10*3/uL (ref 0.7–4.0)
MCHC: 33.2 g/dL (ref 30.0–36.0)
MCV: 97.4 fL (ref 78.0–100.0)
Monocytes Absolute: 0.5 10*3/uL (ref 0.1–1.0)
Monocytes Relative: 9.5 % (ref 3.0–12.0)
Neutro Abs: 3.3 10*3/uL (ref 1.4–7.7)
Neutrophils Relative %: 57.7 % (ref 43.0–77.0)
Platelets: 260 10*3/uL (ref 150.0–400.0)
RBC: 4.11 Mil/uL (ref 3.87–5.11)
RDW: 13.6 % (ref 11.5–15.5)
WBC: 5.7 10*3/uL (ref 4.0–10.5)

## 2023-06-21 LAB — VITAMIN B12: Vitamin B-12: >1537 pg/mL — ABNORMAL HIGH (ref 211–911)

## 2023-06-21 LAB — TSH: TSH: 11.35 u[IU]/mL — ABNORMAL HIGH (ref 0.35–5.50)

## 2023-06-21 MED ORDER — VITAMIN D (ERGOCALCIFEROL) 1.25 MG (50000 UNIT) PO CAPS: 50000.0000 [IU] | ORAL_CAPSULE | ORAL | 1 refills | Status: AC

## 2023-06-25 ENCOUNTER — Other Ambulatory Visit: Payer: Self-pay | Admitting: Family Medicine

## 2023-06-27 NOTE — Telephone Encounter (Signed)
LAST APPOINTMENT DATE: 06/09/2023  NEXT APPOINTMENT DATE: Visit date not found    LAST REFILL: 12/28/2023  QTY:30  Ref 5

## 2023-07-04 DIAGNOSIS — L570 Actinic keratosis: Secondary | ICD-10-CM | POA: Diagnosis not present

## 2023-07-04 DIAGNOSIS — L814 Other melanin hyperpigmentation: Secondary | ICD-10-CM | POA: Diagnosis not present

## 2023-07-04 DIAGNOSIS — C44519 Basal cell carcinoma of skin of other part of trunk: Secondary | ICD-10-CM | POA: Diagnosis not present

## 2023-07-04 DIAGNOSIS — Z85828 Personal history of other malignant neoplasm of skin: Secondary | ICD-10-CM | POA: Diagnosis not present

## 2023-07-04 DIAGNOSIS — L821 Other seborrheic keratosis: Secondary | ICD-10-CM | POA: Diagnosis not present

## 2023-07-04 DIAGNOSIS — L820 Inflamed seborrheic keratosis: Secondary | ICD-10-CM | POA: Diagnosis not present

## 2023-07-06 DIAGNOSIS — Z1231 Encounter for screening mammogram for malignant neoplasm of breast: Secondary | ICD-10-CM | POA: Diagnosis not present

## 2023-07-13 ENCOUNTER — Encounter: Payer: Self-pay | Admitting: Obstetrics and Gynecology

## 2023-07-14 ENCOUNTER — Encounter: Payer: Self-pay | Admitting: Family Medicine

## 2023-07-26 ENCOUNTER — Other Ambulatory Visit: Payer: Self-pay | Admitting: Family Medicine

## 2023-09-05 DIAGNOSIS — H04123 Dry eye syndrome of bilateral lacrimal glands: Secondary | ICD-10-CM | POA: Diagnosis not present

## 2023-09-05 DIAGNOSIS — H5212 Myopia, left eye: Secondary | ICD-10-CM | POA: Diagnosis not present

## 2023-09-05 DIAGNOSIS — H524 Presbyopia: Secondary | ICD-10-CM | POA: Diagnosis not present

## 2023-09-05 DIAGNOSIS — H2513 Age-related nuclear cataract, bilateral: Secondary | ICD-10-CM | POA: Diagnosis not present

## 2023-09-05 DIAGNOSIS — H353132 Nonexudative age-related macular degeneration, bilateral, intermediate dry stage: Secondary | ICD-10-CM | POA: Diagnosis not present

## 2023-09-19 ENCOUNTER — Encounter: Payer: Self-pay | Admitting: Family Medicine

## 2023-10-06 ENCOUNTER — Other Ambulatory Visit: Payer: Self-pay | Admitting: Obstetrics and Gynecology

## 2023-10-06 DIAGNOSIS — Z79890 Hormone replacement therapy (postmenopausal): Secondary | ICD-10-CM

## 2023-10-06 NOTE — Telephone Encounter (Signed)
 Medication refill request: estradiol  tab 2mg  Last AEX:  11/02/22 Next AEX: 03/27/24 Last MMG (if hormonal medication request): 07/06/23 bi-rads 2 benign  Refill authorized: please advise?

## 2023-11-10 ENCOUNTER — Other Ambulatory Visit: Payer: Self-pay | Admitting: Obstetrics and Gynecology

## 2023-11-10 NOTE — Telephone Encounter (Signed)
 Med refill request: Prometrium   Last AEX: 11/02/23 Next AEX: 03/27/24 Last MMG (if hormonal med) 07/06/23 birads cat 2 benign  Refill authorized: last rx 11/02/22 #90 with 3 refills. Please approve or deny

## 2023-11-14 DIAGNOSIS — C44311 Basal cell carcinoma of skin of nose: Secondary | ICD-10-CM | POA: Diagnosis not present

## 2023-12-05 ENCOUNTER — Other Ambulatory Visit: Payer: Self-pay | Admitting: Family Medicine

## 2023-12-14 DIAGNOSIS — C44311 Basal cell carcinoma of skin of nose: Secondary | ICD-10-CM | POA: Diagnosis not present

## 2023-12-19 ENCOUNTER — Other Ambulatory Visit: Payer: Self-pay | Admitting: Family Medicine

## 2024-01-09 ENCOUNTER — Other Ambulatory Visit: Payer: Self-pay | Admitting: Family Medicine

## 2024-01-31 DIAGNOSIS — M546 Pain in thoracic spine: Secondary | ICD-10-CM | POA: Diagnosis not present

## 2024-01-31 DIAGNOSIS — M545 Low back pain, unspecified: Secondary | ICD-10-CM | POA: Diagnosis not present

## 2024-02-06 ENCOUNTER — Other Ambulatory Visit: Payer: Self-pay | Admitting: Family Medicine

## 2024-02-10 ENCOUNTER — Other Ambulatory Visit: Payer: Self-pay | Admitting: Obstetrics and Gynecology

## 2024-02-10 NOTE — Telephone Encounter (Signed)
 Med refill request: progesterone   Last AEX: 11/02/22 Next AEX: 03/27/24 Last MMG (if hormonal med) 07/06/23 birads cat 2 benign  Refill authorized: last rx 11/11/23 #90 with 1 refill. Please approve or deny

## 2024-02-13 ENCOUNTER — Other Ambulatory Visit: Payer: Self-pay | Admitting: Obstetrics and Gynecology

## 2024-02-13 DIAGNOSIS — Z79890 Hormone replacement therapy (postmenopausal): Secondary | ICD-10-CM

## 2024-02-13 NOTE — Telephone Encounter (Signed)
.  Med refill request: Estrace   Last AEX: 11/02/22 Next AEX: 03/27/24 Last MMG (if hormonal med) 07/06/23 - BI-RADS Category 2 Benign  Refill authorized: Please Advise?

## 2024-03-27 ENCOUNTER — Ambulatory Visit: Admitting: Obstetrics and Gynecology

## 2024-03-27 ENCOUNTER — Encounter: Payer: Self-pay | Admitting: Obstetrics and Gynecology

## 2024-03-27 ENCOUNTER — Other Ambulatory Visit (HOSPITAL_COMMUNITY)
Admission: RE | Admit: 2024-03-27 | Discharge: 2024-03-27 | Disposition: A | Discharge status: Home / Self Care | Source: Ambulatory Visit | Attending: Obstetrics and Gynecology | Admitting: Obstetrics and Gynecology

## 2024-03-27 ENCOUNTER — Ambulatory Visit: Payer: Self-pay | Admitting: Obstetrics and Gynecology

## 2024-03-27 VITALS — BP 100/62 | HR 53 | Ht 68.0 in | Wt 158.0 lb

## 2024-03-27 DIAGNOSIS — Z8619 Personal history of other infectious and parasitic diseases: Secondary | ICD-10-CM | POA: Insufficient documentation

## 2024-03-27 DIAGNOSIS — Z124 Encounter for screening for malignant neoplasm of cervix: Secondary | ICD-10-CM

## 2024-03-27 DIAGNOSIS — Z1331 Encounter for screening for depression: Secondary | ICD-10-CM | POA: Diagnosis not present

## 2024-03-27 DIAGNOSIS — Z01419 Encounter for gynecological examination (general) (routine) without abnormal findings: Secondary | ICD-10-CM | POA: Diagnosis not present

## 2024-03-27 DIAGNOSIS — Z79890 Hormone replacement therapy (postmenopausal): Secondary | ICD-10-CM

## 2024-03-27 DIAGNOSIS — Z780 Asymptomatic menopausal state: Secondary | ICD-10-CM

## 2024-03-27 DIAGNOSIS — R350 Frequency of micturition: Secondary | ICD-10-CM

## 2024-03-27 DIAGNOSIS — N951 Menopausal and female climacteric states: Secondary | ICD-10-CM

## 2024-03-27 LAB — URINALYSIS, COMPLETE W/RFL CULTURE
Bacteria, UA: NONE SEEN /HPF
Bilirubin Urine: NEGATIVE
Casts: NONE SEEN /LPF
Crystals: NONE SEEN /HPF
Glucose, UA: NEGATIVE
Hgb urine dipstick: NEGATIVE
Ketones, ur: NEGATIVE
Leukocyte Esterase: NEGATIVE
Nitrites, Initial: NEGATIVE
Protein, ur: NEGATIVE
RBC / HPF: NONE SEEN /HPF (ref 0–2)
Specific Gravity, Urine: 1.015 (ref 1.001–1.035)
WBC, UA: NONE SEEN /HPF (ref 0–5)
Yeast: NONE SEEN /HPF
pH: 7 (ref 5.0–8.0)

## 2024-03-27 LAB — NO CULTURE INDICATED

## 2024-03-27 MED ORDER — PROGESTERONE 200 MG PO CAPS: 200.0000 mg | ORAL_CAPSULE | Freq: Every day | ORAL | 3 refills | Status: AC

## 2024-03-27 MED ORDER — ESTRADIOL 2 MG PO TABS: 2.0000 mg | ORAL_TABLET | Freq: Every day | ORAL | 3 refills | Status: AC

## 2024-03-27 NOTE — Progress Notes (Unsigned)
 63 y.o. [id number] Legally Separated Caucasian female here for annual exam.  Thinks she may have urinary infection, having some urinary frequency. States this has been going on for a while. No other symptoms.  Having low back discomfort.  Has scoliosis.   On HRT. Night sweats are lessening.  Hot flashes during the day are gone.    No bleeding.    PCP: Katrinka Garnette KIDD, MD   Patient's last menstrual period was 09/28/2008.           Sexually active: Yes.    The current method of family planning is post menopausal status.    Menopausal hormone therapy:  Estrace  & progesterone   Exercising: Yes.    Water aerobic during summer, walking, and yoga 1x week  Smoker:  no  OB History  Gravida Para Term Preterm AB Living  3 2   1 2   SAB IAB Ectopic Multiple Live Births  1        # Outcome Date GA Lbr Len/2nd Weight Sex Type Anes PTL Lv  3 SAB           2 Para           1 Para              HEALTH MAINTENANCE: Last 2 paps:  11/02/22 neg HR HPV neg, 05-19-21 Neg:Pos HR HPV  at [facility], 01/22/19 neg History of abnormal Pap or positive HPV:  yes Mammogram:   07/06/23 Breast Density Cat C, BIRADS Cat 2 benign  Colonoscopy:  01/06/21  Bone Density:  n/a  Result  n/a   Immunization History  Administered Date(s) Administered   Janssen (J&J) SARS-COV-2 Vaccination 08/29/2019   Tdap 10/14/2015   Zoster Recombinant(Shingrix) 06/22/2019      reports that she has never smoked. She has never used smokeless tobacco. She reports current alcohol use of about 14.0 standard drinks of alcohol per week. She reports that she does not use drugs.  Past Medical History:  Diagnosis Date   Allergy    Anxiety    Arthritis    ASCUS of cervix with negative high risk HPV 12/2016   Bipolar 2 disorder (HCC)    Cancer (HCC)    Chronic sinusitis    Chronic venous insufficiency    superficial---  s/p  failed attempt sclerotherapy   CIN I (cervical intraepithelial neoplasia I) 2023   DDD  (degenerative disc disease)    Depression    Endometrial polyp    Fibroids 06/08/2021   US  at [facility]- 4.09 cm, 2.67 cm, 1,38 cm   History of basal cell carcinoma (BCC) excision    08-30-2014  left mid medial chest   History of squamous cell carcinoma excision    2016 approx. x3 shoulder area/   left lower leg , shin 02-02-2017 approx.   Hypothyroidism    Macular degeneration    PMB (postmenopausal bleeding)    PONV (postoperative nausea and vomiting)    Scoliosis    Varicose vein of leg     Past Surgical History:  Procedure Laterality Date   ACHILLES TENDON SURGERY  2001   BREAST SURGERY  march 2011   breast bx-B-9, fibrocyctic changes   COLONOSCOPY  02-19-2010  dr princella brodie   DILATATION & CURETTAGE/HYSTEROSCOPY WITH MYOSURE N/A 02/21/2017   Procedure: DILATATION & CURETTAGE/HYSTEROSCOPY WITH MYOSURE;  Surgeon: Rockney Evalene SQUIBB, MD;  Location: [facility];  Service: Gynecology;  Laterality: N/A;   DILATATION & CURETTAGE/HYSTEROSCOPY WITH  MYOSURE N/A 10/27/2021   Procedure: DILATATION & CURETTAGE/HYSTEROSCOPY WITH MYOSURE;  Surgeon: Cathlyn JAYSON Nikki Bobie FORBES, MD;  Location: [facility];  Service: Gynecology;  Laterality: N/A;   FACIAL COSMETIC SURGERY  2012   INGUINAL HERNIA REPAIR Left 1995   2 hernia's on left side   TOTAL HIP ARTHROPLASTY Left 01/24/2015   Procedure: TOTAL HIP ARTHROPLASTY ANTERIOR APPROACH;  Surgeon: Norleen Gavel, MD;  Location: [facility];  Service: Orthopedics;  Laterality: Left;    Current Outpatient Medications  Medication Sig Dispense Refill   Biotin 1 MG CAPS SMARTSIG:1 By Mouth     Cholecalciferol (VITAMIN D3) 2000 units TABS Take 1 tablet by mouth daily.     escitalopram  (LEXAPRO ) 20 MG tablet TAKE 1 TABLET BY MOUTH DAILY 90 tablet 3   fexofenadine  (ALLEGRA ) 180 MG tablet TAKE 1 TABLET BY MOUTH EVERY DAY AS NEEDED 90 tablet 3   Glucosamine HCl (GLUCOSAMINE PO) Take 1 tablet by mouth daily.     lamoTRIgine  (LAMICTAL )  100 MG tablet TAKE 1 TABLET BY MOUTH 2 TIMES DAILY 180 tablet 2   levothyroxine  (SYNTHROID ) 88 MCG tablet TAKE 1 TABLET BY MOUTH EVERY DAY WITH BREAKFAST 30 tablet 5   Multiple Vitamins-Minerals (SYSTANE ICAPS AREDS2) CAPS Take 2 capsules by mouth daily.     naproxen sodium (ALEVE) 220 MG tablet Take 220 mg by mouth.     Omega-3 Fatty Acids (FISH OIL PO) Take 1 capsule by mouth daily.      RETIN-A  0.05 % cream Apply 1 application. topically at bedtime.     vitamin B-12 (CYANOCOBALAMIN ) 50 MCG tablet Take 50 mcg by mouth daily.     Vitamin D , Ergocalciferol , (DRISDOL ) 1.25 MG (50000 UNIT) CAPS capsule Take 1 capsule (50,000 Units total) by mouth every 7 (seven) days. 13 capsule 1   zolpidem  (AMBIEN ) 10 MG tablet TAKE 1 TABLET BY MOUTH EVERY EVENING FOR SLEEP 30 tablet 5   estradiol  (ESTRACE ) 2 MG tablet Take 1 tablet (2 mg total) by mouth daily. 90 tablet 3   OVER THE COUNTER MEDICATION Sera Vital Takes 4 tablets qhs (Patient not taking: Reported on 03/27/2024)     progesterone  (PROMETRIUM ) 200 MG capsule Take 1 capsule (200 mg total) by mouth at bedtime. 90 capsule 3   No current facility-administered medications for this visit.    ALLERGIES: Penicillins and Sulfa antibiotics  Family History  Problem Relation Age of Onset   Thyroid  disease Mother    Breast cancer Mother 63       57 in 2017, survived breast cancer   Heart failure Mother    Diabetes Father    Hypertension Father    Stroke Father 3       died at 24   CVA Brother        37s smoking   Alcohol abuse Brother        recovering   Cancer Maternal Grandmother 31       lung, smoker   Stroke Maternal Grandfather    Heart disease Paternal Grandmother        mi   Colon cancer Neg Hx    Rectal cancer Neg Hx    Stomach cancer Neg Hx     Review of Systems  All other systems reviewed and are negative.   PHYSICAL EXAM:  BP 100/62 (BP Location: Left Arm, Patient Position: Sitting)   Pulse (!) 53   Ht 5' 8 (1.727 m)    Wt 158 lb (71.7 kg)  LMP 09/28/2008   SpO2 97%   BMI 24.02 kg/m     General appearance: alert, cooperative and appears stated age Head: normocephalic, without obvious abnormality, atraumatic Neck: no adenopathy, supple, symmetrical, trachea midline and thyroid  normal to inspection and palpation Lungs: clear to auscultation bilaterally Breasts: normal appearance, no masses or tenderness, No nipple retraction or dimpling, No nipple discharge or bleeding, No axillary adenopathy Heart: regular rate and rhythm Abdomen: soft, non-tender; no masses, no organomegaly Extremities: extremities normal, atraumatic, no cyanosis or edema Skin: skin color, texture, turgor normal. No rashes or lesions Lymph nodes: cervical, supraclavicular, and axillary nodes normal. Neurologic: grossly normal  Pelvic: External genitalia:  no lesions              No abnormal inguinal nodes palpated.              Urethra:  normal appearing urethra with no masses, tenderness or lesions              Bartholins and Skenes: normal                 Vagina: normal appearing vagina with normal color and discharge, no lesions              Cervix: no lesions              Pap taken: yes Bimanual Exam:  Uterus: 6 week size, irregular.              Adnexa: no mass, fullness, tenderness              Rectal exam: yes.  Confirms.              Anus:  normal sphincter tone, no lesions  Chaperone was present for exam:  Clotilda DEL, CMA  ASSESSMENT: Well woman visit with gynecologic exam. Status post hysteroscopy with resection of endometrial polyp, dilation and curettage.  Fibroids on exam, about 6 week size.   HRT. Positive HR HPV. PHQ-2-9: 0 Fh breast cancer.   ***  PLAN: Mammogram screening discussed. Self breast awareness reviewed. Pap and HRV collected:  {yes no:314532} Guidelines for Calcium, Vitamin D , regular exercise program including cardiovascular and weight bearing exercise. Medication refills:  *** ***  Dexa  at [facility].  {LABS (Optional):23779} Follow up:  ***    Additional counseling given.  {yes [id number]. ***  total time was spent for this patient encounter, including preparation, face-to-face counseling with the patient, coordination of care, and documentation of the encounter in addition to doing the well woman visit with gynecologic exam.

## 2024-03-27 NOTE — Patient Instructions (Signed)

## 2024-03-28 LAB — CYTOLOGY - PAP
Comment: NEGATIVE
Diagnosis: NEGATIVE
High risk HPV: NEGATIVE

## 2024-04-05 DIAGNOSIS — M25551 Pain in right hip: Secondary | ICD-10-CM | POA: Diagnosis not present

## 2024-04-11 ENCOUNTER — Other Ambulatory Visit: Payer: Self-pay | Admitting: Family Medicine

## 2024-05-02 ENCOUNTER — Ambulatory Visit: Admitting: Family Medicine

## 2024-05-07 ENCOUNTER — Ambulatory Visit: Admitting: Family

## 2024-05-07 ENCOUNTER — Encounter: Payer: Self-pay | Admitting: Family

## 2024-05-07 VITALS — BP 98/52 | HR 58 | Temp 97.0°F | Ht 68.0 in | Wt 161.8 lb

## 2024-05-07 DIAGNOSIS — E559 Vitamin D deficiency, unspecified: Secondary | ICD-10-CM

## 2024-05-07 DIAGNOSIS — L988 Other specified disorders of the skin and subcutaneous tissue: Secondary | ICD-10-CM | POA: Diagnosis not present

## 2024-05-07 DIAGNOSIS — E039 Hypothyroidism, unspecified: Secondary | ICD-10-CM | POA: Diagnosis not present

## 2024-05-07 MED ORDER — TRETINOIN 0.1 % EX CREA: TOPICAL_CREAM | Freq: Every day | CUTANEOUS | 0 refills | Status: AC

## 2024-05-07 NOTE — Progress Notes (Unsigned)
 Patient ID: Dorothy Lynch, female    DOB: [birth year], 63 y.o.   MRN: [id number]  Chief Complaint  Patient presents with  . Fatigue    Pt c/o fatigue, present for 3 weeks.    Subjective:    Outpatient Medications Prior to Visit  Medication Sig Dispense Refill  . Biotin 1 MG CAPS SMARTSIG:1 By Mouth    . Cholecalciferol (VITAMIN D3) 2000 units TABS Take 1 tablet by mouth daily.    . escitalopram  (LEXAPRO ) 20 MG tablet TAKE 1 TABLET BY MOUTH DAILY 90 tablet 3  . estradiol  (ESTRACE ) 2 MG tablet Take 1 tablet (2 mg total) by mouth daily. 90 tablet 3  . fexofenadine  (ALLEGRA ) 180 MG tablet TAKE 1 TABLET BY MOUTH EVERY DAY AS NEEDED 90 tablet 3  . Glucosamine HCl (GLUCOSAMINE PO) Take 1 tablet by mouth daily.    . lamoTRIgine  (LAMICTAL ) 100 MG tablet TAKE 1 TABLET BY MOUTH 2 TIMES DAILY 180 tablet 2  . levothyroxine  (SYNTHROID ) 88 MCG tablet TAKE 1 TABLET BY MOUTH EVERY DAY WITH BREAKFAST 30 tablet 5  . Multiple Vitamins-Minerals (SYSTANE ICAPS AREDS2) CAPS Take 2 capsules by mouth daily.    . naproxen sodium (ALEVE) 220 MG tablet Take 220 mg by mouth.    . Omega-3 Fatty Acids (FISH OIL PO) Take 1 capsule by mouth daily.     SABRA OVER THE COUNTER MEDICATION Sera Vital Takes 4 tablets qhs    . progesterone  (PROMETRIUM ) 200 MG capsule Take 1 capsule (200 mg total) by mouth at bedtime. 90 capsule 3  . RETIN-A  0.05 % cream Apply 1 application. topically at bedtime.    . vitamin B-12 (CYANOCOBALAMIN ) 50 MCG tablet Take 50 mcg by mouth daily.    . zolpidem  (AMBIEN ) 10 MG tablet TAKE 1 TABLET BY MOUTH EVERY EVENING FOR SLEEP 30 tablet 5  . methocarbamol  (ROBAXIN ) 500 MG tablet Take 500 mg by mouth 2 (two) times daily.    . Vitamin D , Ergocalciferol , (DRISDOL ) 1.25 MG (50000 UNIT) CAPS capsule Take 1 capsule (50,000 Units total) by mouth every 7 (seven) days. 13 capsule 1   No facility-administered medications prior to visit.   Past Medical History:  Diagnosis Date  . Allergy   . Anxiety   .  Arthritis   . ASCUS of cervix with negative high risk HPV 12/2016  . Bipolar 2 disorder (HCC)   . Cancer (HCC)   . Chronic sinusitis   . Chronic venous insufficiency    superficial---  s/p  failed attempt sclerotherapy  . CIN I (cervical intraepithelial neoplasia I) 2023  . DDD (degenerative disc disease)   . Depression   . Endometrial polyp   . Fibroids 06/08/2021   US  at [facility]- 4.09 cm, 2.67 cm, 1,38 cm  . History of basal cell carcinoma (BCC) excision    08-30-2014  left mid medial chest  . History of squamous cell carcinoma excision    2016 approx. x3 shoulder area/   left lower leg , shin 02-02-2017 approx.  . Hypothyroidism   . Macular degeneration   . PMB (postmenopausal bleeding)   . PONV (postoperative nausea and vomiting)   . Scoliosis   . Varicose vein of leg    Past Surgical History:  Procedure Laterality Date  . ACHILLES TENDON SURGERY  2001  . BREAST SURGERY  march 2011   breast bx-B-9, fibrocyctic changes  . COLONOSCOPY  02-19-2010  dr princella brodie  . DILATATION & CURETTAGE/HYSTEROSCOPY WITH MYOSURE N/A 02/21/2017  Procedure: DILATATION & CURETTAGE/HYSTEROSCOPY WITH MYOSURE;  Surgeon: Rockney Evalene SQUIBB, MD;  Location: [facility];  Service: Gynecology;  Laterality: N/A;  . DILATATION & CURETTAGE/HYSTEROSCOPY WITH MYOSURE N/A 10/27/2021   Procedure: DILATATION & CURETTAGE/HYSTEROSCOPY WITH MYOSURE;  Surgeon: Cathlyn JAYSON Nikki Bobie FORBES, MD;  Location: [facility];  Service: Gynecology;  Laterality: N/A;  . FACIAL COSMETIC SURGERY  2012  . INGUINAL HERNIA REPAIR Left 1995   2 hernia's on left side  . TOTAL HIP ARTHROPLASTY Left 01/24/2015   Procedure: TOTAL HIP ARTHROPLASTY ANTERIOR APPROACH;  Surgeon: Norleen Gavel, MD;  Location: [facility];  Service: Orthopedics;  Laterality: Left;   Allergies  Allergen Reactions  . Penicillins Other (See Comments)    Headaches   . Sulfa Antibiotics Other (See Comments)    migraines       Objective:    Physical Exam Vitals and nursing note reviewed.  Constitutional:      Appearance: Normal appearance.  Cardiovascular:     Rate and Rhythm: Normal rate and regular rhythm.  Pulmonary:     Effort: Pulmonary effort is normal.     Breath sounds: Normal breath sounds.  Musculoskeletal:        General: Normal range of motion.  Skin:    General: Skin is warm and dry.  Neurological:     Mental Status: She is alert.  Psychiatric:        Mood and Affect: Mood normal.        Behavior: Behavior normal.    BP (!) 98/52 (BP Location: Left Arm, Patient Position: Sitting, Cuff Size: Normal)   Pulse (!) 58   Temp (!) 97 F (36.1 C) (Temporal)   Ht 5' 8 (1.727 m)   Wt 161 lb 12.8 oz (73.4 kg)   LMP 09/28/2008   SpO2 98%   BMI 24.60 kg/m  Wt Readings from Last 3 Encounters:  05/07/24 161 lb 12.8 oz (73.4 kg)  03/27/24 158 lb (71.7 kg)  06/09/23 159 lb 3.2 oz (72.2 kg)       Lucius Krabbe, NP

## 2024-05-08 DIAGNOSIS — R531 Weakness: Secondary | ICD-10-CM | POA: Diagnosis not present

## 2024-05-08 DIAGNOSIS — M25551 Pain in right hip: Secondary | ICD-10-CM | POA: Diagnosis not present

## 2024-05-08 DIAGNOSIS — M5459 Other low back pain: Secondary | ICD-10-CM | POA: Diagnosis not present

## 2024-05-08 DIAGNOSIS — M25651 Stiffness of right hip, not elsewhere classified: Secondary | ICD-10-CM | POA: Diagnosis not present

## 2024-05-08 LAB — TSH: TSH: 3.12 u[IU]/mL (ref 0.35–5.50)

## 2024-05-08 LAB — VITAMIN D 25 HYDROXY (VIT D DEFICIENCY, FRACTURES): VITD: 43.99 ng/mL (ref 30.00–100.00)

## 2024-05-08 LAB — T4, FREE: Free T4: 0.87 ng/dL (ref 0.60–1.60)

## 2024-05-09 ENCOUNTER — Ambulatory Visit: Payer: Self-pay | Admitting: Family

## 2024-05-16 DIAGNOSIS — R531 Weakness: Secondary | ICD-10-CM | POA: Diagnosis not present

## 2024-05-16 DIAGNOSIS — M5459 Other low back pain: Secondary | ICD-10-CM | POA: Diagnosis not present

## 2024-05-16 DIAGNOSIS — M25551 Pain in right hip: Secondary | ICD-10-CM | POA: Diagnosis not present

## 2024-05-16 DIAGNOSIS — M25651 Stiffness of right hip, not elsewhere classified: Secondary | ICD-10-CM | POA: Diagnosis not present

## 2024-06-12 ENCOUNTER — Encounter: Payer: Self-pay | Admitting: Family Medicine

## 2024-06-12 ENCOUNTER — Ambulatory Visit (INDEPENDENT_AMBULATORY_CARE_PROVIDER_SITE_OTHER): Payer: BC Managed Care – PPO | Admitting: Family Medicine

## 2024-06-12 VITALS — BP 108/70 | HR 62 | Temp 97.8°F | Ht 68.0 in | Wt 161.2 lb

## 2024-06-12 DIAGNOSIS — Z0000 Encounter for general adult medical examination without abnormal findings: Secondary | ICD-10-CM

## 2024-06-12 NOTE — Progress Notes (Signed)
 " Phone [phone]   Subjective:  Patient presents today for their annual physical. Chief complaint-noted.   See problem oriented charting- ROS- full  review of systems was completed and negative except for topics noted under acute/chronic concerns   The following were reviewed and entered/updated in epic: Past Medical History:  Diagnosis Date   Allergy    Anxiety    Arthritis    ASCUS of cervix with negative high risk HPV 12/2016   Bipolar 2 disorder (HCC)    Cancer (HCC)    Chronic sinusitis    Chronic venous insufficiency    superficial---  s/p  failed attempt sclerotherapy   CIN I (cervical intraepithelial neoplasia I) 2023   DDD (degenerative disc disease)    Depression    Endometrial polyp    Fibroids 06/08/2021   US  at [facility]- 4.09 cm, 2.67 cm, 1,38 cm   History of basal cell carcinoma (BCC) excision    08-30-2014  left mid medial chest   History of squamous cell carcinoma excision    2016 approx. x3 shoulder area/   left lower leg , shin 02-02-2017 approx.   Hypothyroidism    Macular degeneration    PMB (postmenopausal bleeding)    PONV (postoperative nausea and vomiting)    Scoliosis    Varicose vein of leg    Patient Active Problem List   Diagnosis Date Noted   Bipolar II disorder (HCC) 01/18/2011    Priority: High   Vitamin B12 deficiency 02/22/2019    Priority: Medium    Menopausal symptoms 10/14/2015    Priority: Medium    Hypothyroidism 10/14/2015    Priority: Medium    Insomnia 01/18/2011    Priority: Medium    Constipation 12/19/2017    Priority: Low   Allergic rhinitis 10/14/2015    Priority: Low   Yeast infection 10/14/2015    Priority: Low   Primary osteoarthritis of left hip 01/24/2015    Priority: Low   History of squamous cell carcinoma of skin     Priority: Low   Past Surgical History:  Procedure Laterality Date   ACHILLES TENDON SURGERY  2001   BREAST SURGERY  march 2011   breast bx-B-9, fibrocyctic changes   COLONOSCOPY   02-19-2010  dr princella brodie   DILATATION & CURETTAGE/HYSTEROSCOPY WITH MYOSURE N/A 02/21/2017   Procedure: DILATATION & CURETTAGE/HYSTEROSCOPY WITH MYOSURE;  Surgeon: Rockney Evalene SQUIBB, MD;  Location: [facility];  Service: Gynecology;  Laterality: N/A;   DILATATION & CURETTAGE/HYSTEROSCOPY WITH MYOSURE N/A 10/27/2021   Procedure: DILATATION & CURETTAGE/HYSTEROSCOPY WITH MYOSURE;  Surgeon: Cathlyn JAYSON Nikki Bobie FORBES, MD;  Location: [facility];  Service: Gynecology;  Laterality: N/A;   FACIAL COSMETIC SURGERY  2012   INGUINAL HERNIA REPAIR Left 1995   2 hernia's on left side   TOTAL HIP ARTHROPLASTY Left 01/24/2015   Procedure: TOTAL HIP ARTHROPLASTY ANTERIOR APPROACH;  Surgeon: Norleen Gavel, MD;  Location: [facility];  Service: Orthopedics;  Laterality: Left;    Family History  Problem Relation Age of Onset   Thyroid  disease Mother    Breast cancer Mother 74       32 in 2017, survived breast cancer   Heart failure Mother    Diabetes Father    Hypertension Father    Stroke Father 35       died at 58   CVA Brother        46s smoking   Alcohol abuse Brother  recovering   Cancer Maternal Grandmother 42       lung, smoker   Stroke Maternal Grandfather    Heart disease Paternal Grandmother        mi   Colon cancer Neg Hx    Rectal cancer Neg Hx    Stomach cancer Neg Hx     Medications- reviewed and updated Current Outpatient Medications  Medication Sig Dispense Refill   Biotin 1 MG CAPS SMARTSIG:1 By Mouth     Cholecalciferol (VITAMIN D3) 2000 units TABS Take 1 tablet by mouth daily.     escitalopram  (LEXAPRO ) 20 MG tablet TAKE 1 TABLET BY MOUTH DAILY 90 tablet 3   estradiol  (ESTRACE ) 2 MG tablet Take 1 tablet (2 mg total) by mouth daily. 90 tablet 3   fexofenadine  (ALLEGRA ) 180 MG tablet TAKE 1 TABLET BY MOUTH EVERY DAY AS NEEDED 90 tablet 3   Glucosamine HCl (GLUCOSAMINE PO) Take 1 tablet by mouth daily.     lamoTRIgine  (LAMICTAL ) 100 MG tablet  TAKE 1 TABLET BY MOUTH 2 TIMES DAILY 180 tablet 2   levothyroxine  (SYNTHROID ) 88 MCG tablet TAKE 1 TABLET BY MOUTH EVERY DAY WITH BREAKFAST 30 tablet 5   methocarbamol  (ROBAXIN ) 500 MG tablet Take 500 mg by mouth 2 (two) times daily.     Multiple Vitamins-Minerals (SYSTANE ICAPS AREDS2) CAPS Take 2 capsules by mouth daily.     naproxen sodium (ALEVE) 220 MG tablet Take 220 mg by mouth.     Omega-3 Fatty Acids (FISH OIL PO) Take 1 capsule by mouth daily.      OVER THE COUNTER MEDICATION Sera Vital Takes 4 tablets qhs     progesterone  (PROMETRIUM ) 200 MG capsule Take 1 capsule (200 mg total) by mouth at bedtime. 90 capsule 3   tretinoin  (RETIN-A ) 0.1 % cream Apply topically at bedtime. 45 g 0   vitamin B-12 (CYANOCOBALAMIN ) 50 MCG tablet Take 50 mcg by mouth daily.     Vitamin D , Ergocalciferol , (DRISDOL ) 1.25 MG (50000 UNIT) CAPS capsule Take 1 capsule (50,000 Units total) by mouth every 7 (seven) days. 13 capsule 1   zolpidem  (AMBIEN ) 10 MG tablet TAKE 1 TABLET BY MOUTH EVERY EVENING FOR SLEEP 30 tablet 5   No current facility-administered medications for this visit.    Allergies-reviewed and updated Allergies[1]  Social History   Social History Narrative   Re-married April 2022 to husband Karleen. Divorced from ex-husband John. 2 children. Daughter Randall is my patient- works in [place]. Son in [place].        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 Oropharynx normal Neck: no thyromegaly CV: RRR no murmurs rubs or gallops Lungs: CTAB no  crackles, wheeze, rhonchi Abdomen: soft/nontender/nondistended/normal bowel sounds. No rebound or guarding.  Ext: no edema Skin: warm, dry Neuro: grossly normal, moves all extremities, PERRLA   Assessment and Plan   64 y.o. female presenting for annual physical.  Health Maintenance counseling: 1. Anticipatory guidance: Patient  counseled regarding regular dental exams -q6 months, eye exams - yearly,  avoiding smoking and second hand smoke , limiting alcohol to 1 beverage per day- 5 a week , no illicit drugs .   2. Risk factor reduction:  Advised patient of need for regular exercise and diet rich and fruits and vegetables to reduce risk of heart attack and stroke.  Exercise- limited lately- working to move around this-didn't like spin in class, doesn't like bike.  Diet/weight management-remains healthy weight.  Wt Readings from Last 3 Encounters:  06/12/24 161 lb 3.2 oz (73.1 kg)  05/07/24 161 lb 12.8 oz (73.4 kg)  03/27/24 158 lb (71.7 kg)  3. Immunizations/screenings/ancillary studies- holding off on flu, shingrix, prevnar Immunization History  Administered Date(s) Administered   Janssen (J&J) SARS-COV-2 Vaccination 08/29/2019   Tdap 10/14/2015   Zoster Recombinant(Shingrix) 06/22/2019  4. Cervical cancer screening- 03/27/24 with 5 year repeat- technically past age based screening recommendations  5. Breast cancer screening-  breast exam with gyn and mammogram 07/06/23 and has yearly 6. Colon cancer screening - 01/06/21 with 10 year repeat 7. Skin cancer screening- sees dermatology yearly. advised regular sunscreen use. Denies worrisome, changing, or new skin lesions.  8. Birth control/STD check- postmenopausal only active with husband 9. Osteoporosis screening at 63- plans on DEXA through [facility]- thinks already scheduled- she did this through GYN- they can send us  a copy- if not I can order next time. Does take vitamin D  and does drink some milk/yogurt 10. Smoking associated screening -  never smoker  Status of chronic or acute concerns   #Right buttock pain- working with orthopedic and recent injection earlier today as well as Dr. Deanie.  -she also has scoliosis- was told cardio from Dr. Yvone could be helpful  #hypothyroidism S: compliant On thyroid  medication-levothyroxine  88 mcg Lab Results  Component Value Date   TSH 3.12 05/07/2024   A/P: well controlled continue current medications     # Bipolar S:Lexapro  20 mg, Lamictal  100 mg twice daily, Ambien  10 mg for sleep - some ups and downs with pain she thinks as well as stress worrying about her daughte rand husband Karleen who just had surgery on neck -no therapist at present- but doesn't feel has time right now. Pain leads to less exercise which can make her feel more down- she's looking for ways around this barrier that she can do    06/12/2024    3:03 PM 03/27/2024   12:09 PM 06/09/2023    3:46 PM  Depression screen PHQ 2/9  Decreased Interest 1 0 0  Down, Depressed, Hopeless 1 0 1  PHQ - 2 Score 2 0 1  Altered sleeping 0  3  Tired, decreased energy 1  1  Change in appetite 0  0  Feeling bad or failure about yourself  2  1  Trouble concentrating 2  3  Moving slowly or fidgety/restless 0  0  Suicidal thoughts 0  0  PHQ-9 Score 7  9   Difficult doing work/chores Somewhat difficult  Somewhat difficult     Data saved with a previous flowsheet row definition  A/P:  mild depression scores but she thinks situation related- she wants to hold steady   # Hormone placement therapy-on through GYN  #Vitamin D  deficiency S: Medication: on high dose in past- 2000 units of vitamin D  Last vitamin D  Lab Results  Component Value Date   VD25OH 43.99 05/07/2024   A/P: well controlled continue current medications    #hyperlipidemia  S: Medication:none The 10-year ASCVD risk score (Arnett DK, et al., 2019) is: 3.6% Lab Results  Component Value Date   CHOL 249 (H) 06/21/2023   HDL 96.90 06/21/2023   LDLCALC 114  (H) 06/21/2023   LDLDIRECT 82.0 03/17/2021   TRIG 189.0 (H) 06/21/2023   CHOLHDL 3 06/21/2023   A/P: 10-year asCVD risk not substantially elevated-continue  without medicine- she wants to hold on lab stoday as well as CBC, CMP but recheck 6-12 months  Recommended follow up: Return in about 6 months (around 12/10/2024) for followup or sooner if needed.Schedule b4 you leave. Future Appointments  Date Time Provider Department Center  04/03/2025  1:30 PM Cathlyn JAYSON Nikki Bobie FORBES, MD [facility] None   Lab/Order associations:NOT fasting   ICD-10-CM   1. Preventative health care  Z00.00       No orders of the defined types were placed in this encounter.   Return precautions advised.  Garnette Lukes, MD      [1]  Allergies Allergen Reactions   Penicillins Other (See Comments)    Headaches    Sulfa Antibiotics Other (See Comments)    migraines   "

## 2024-06-12 NOTE — Patient Instructions (Addendum)
 Holding off on labs with most recent labs already done  Your homework- schedule something at least every 1-2 months fun since that helps you push forward and if things are not getting better with the depression please let me know , but glad no thoughts of self harm- contact us  or 911 immediately if occurs  Recommended follow up: Return in about 6 months (around 12/10/2024) for followup or sooner if needed.Schedule b4 you leave.

## 2024-06-19 ENCOUNTER — Other Ambulatory Visit: Payer: Self-pay | Admitting: Family Medicine

## 2024-12-11 ENCOUNTER — Ambulatory Visit: Admitting: Family Medicine

## 2024-12-20 ENCOUNTER — Ambulatory Visit: Admitting: Family Medicine

## 2025-04-03 ENCOUNTER — Ambulatory Visit: Admitting: Obstetrics and Gynecology

## 2025-06-13 ENCOUNTER — Encounter: Admitting: Family Medicine
